# Patient Record
Sex: Female | Born: 1955
Health system: Southern US, Community
[De-identification: ages and names within clinical notes are randomized; demographics above are authoritative.]

## PROBLEM LIST (undated history)

## (undated) DIAGNOSIS — K2100 Gastro-esophageal reflux disease with esophagitis, without bleeding: Secondary | ICD-10-CM

## (undated) DIAGNOSIS — Z860100 Personal history of colon polyps, unspecified: Secondary | ICD-10-CM

## (undated) DIAGNOSIS — M1990 Unspecified osteoarthritis, unspecified site: Secondary | ICD-10-CM

## (undated) DIAGNOSIS — A419 Sepsis, unspecified organism: Secondary | ICD-10-CM

## (undated) DIAGNOSIS — K2970 Gastritis, unspecified, without bleeding: Secondary | ICD-10-CM

## (undated) DIAGNOSIS — D6949 Other primary thrombocytopenia: Secondary | ICD-10-CM

## (undated) DIAGNOSIS — B1920 Unspecified viral hepatitis C without hepatic coma: Secondary | ICD-10-CM

## (undated) DIAGNOSIS — N390 Urinary tract infection, site not specified: Secondary | ICD-10-CM

## (undated) DIAGNOSIS — C439 Malignant melanoma of skin, unspecified: Secondary | ICD-10-CM

## (undated) DIAGNOSIS — I10 Essential (primary) hypertension: Secondary | ICD-10-CM

## (undated) DIAGNOSIS — N200 Calculus of kidney: Secondary | ICD-10-CM

## (undated) HISTORY — DX: Unspecified viral hepatitis C without hepatic coma: B19.20

## (undated) HISTORY — PX: TONSILLECTOMY: SHX5217

## (undated) HISTORY — DX: Unspecified osteoarthritis, unspecified site: M19.90

## (undated) HISTORY — PX: TOTAL KNEE ARTHROPLASTY: SHX125

## (undated) HISTORY — PX: TONSILLECTOMY: SUR1361

## (undated) HISTORY — DX: Sepsis, unspecified organism: A41.9

## (undated) HISTORY — DX: Urinary tract infection, site not specified: N39.0

## (undated) HISTORY — DX: Other primary thrombocytopenia: D69.49

---

## 2012-02-23 ENCOUNTER — Emergency Department: Payer: Self-pay | Admitting: Emergency Medicine

## 2012-08-22 DIAGNOSIS — A419 Sepsis, unspecified organism: Secondary | ICD-10-CM

## 2012-08-22 DIAGNOSIS — B1920 Unspecified viral hepatitis C without hepatic coma: Secondary | ICD-10-CM

## 2012-08-22 HISTORY — DX: Unspecified viral hepatitis C without hepatic coma: B19.20

## 2012-08-22 HISTORY — DX: Sepsis, unspecified organism: A41.9

## 2012-10-16 LAB — URINALYSIS, COMPLETE
Bilirubin,UR: NEGATIVE
Hyaline Cast: 1
Ketone: NEGATIVE
Nitrite: POSITIVE
Protein: 30
Specific Gravity: 1.017 (ref 1.003–1.030)
Squamous Epithelial: <1
WBC UR: 43 /HPF (ref 0–5)

## 2012-10-16 LAB — CBC
HGB: 14.7 g/dL (ref 12.0–16.0)
MCHC: 33.7 g/dL (ref 32.0–36.0)
MCV: 98 fL (ref 80–100)
RBC: 4.45 10*6/uL (ref 3.80–5.20)
WBC: 6.8 10*3/uL (ref 3.6–11.0)

## 2012-10-16 LAB — COMPREHENSIVE METABOLIC PANEL
Albumin: 3.2 g/dL — ABNORMAL LOW (ref 3.4–5.0)
Alkaline Phosphatase: 136 U/L (ref 50–136)
Anion Gap: 5 — ABNORMAL LOW (ref 7–16)
Calcium, Total: 8.5 mg/dL (ref 8.5–10.1)
Co2: 28 mmol/L (ref 21–32)
Creatinine: 0.71 mg/dL (ref 0.60–1.30)
EGFR (African American): >60
EGFR (Non-African Amer.): >60
Glucose: 106 mg/dL — ABNORMAL HIGH (ref 65–99)
SGOT(AST): 204 U/L — ABNORMAL HIGH (ref 15–37)
SGPT (ALT): 224 U/L — ABNORMAL HIGH (ref 12–78)

## 2012-10-17 ENCOUNTER — Inpatient Hospital Stay: Payer: Self-pay | Admitting: Surgery

## 2012-10-17 LAB — HEPATIC FUNCTION PANEL A (ARMC)
Alkaline Phosphatase: 123 U/L (ref 50–136)
Bilirubin,Total: 1 mg/dL (ref 0.2–1.0)
SGPT (ALT): 255 U/L — ABNORMAL HIGH (ref 12–78)
Total Protein: 7.2 g/dL (ref 6.4–8.2)

## 2012-10-17 LAB — CBC WITH DIFFERENTIAL/PLATELET
Basophil #: 0 10*3/uL (ref 0.0–0.1)
Eosinophil #: 0.1 10*3/uL (ref 0.0–0.7)
Eosinophil %: 0.6 %
HCT: 41.4 % (ref 35.0–47.0)
HGB: 14 g/dL (ref 12.0–16.0)
Lymphocyte %: 17.3 %
MCH: 33.1 pg (ref 26.0–34.0)
MCHC: 33.9 g/dL (ref 32.0–36.0)
MCV: 98 fL (ref 80–100)
Neutrophil #: 6.1 10*3/uL (ref 1.4–6.5)
Neutrophil %: 72.4 %
RBC: 4.24 10*6/uL (ref 3.80–5.20)
RDW: 13.4 % (ref 11.5–14.5)
WBC: 8.5 10*3/uL (ref 3.6–11.0)

## 2012-10-17 LAB — BASIC METABOLIC PANEL
Anion Gap: 7 (ref 7–16)
BUN: 10 mg/dL (ref 7–18)
Calcium, Total: 7.8 mg/dL — ABNORMAL LOW (ref 8.5–10.1)
Chloride: 106 mmol/L (ref 98–107)
EGFR (African American): >60
Glucose: 115 mg/dL — ABNORMAL HIGH (ref 65–99)
Osmolality: 276 (ref 275–301)
Potassium: 3.5 mmol/L (ref 3.5–5.1)
Sodium: 138 mmol/L (ref 136–145)

## 2012-10-18 LAB — COMPREHENSIVE METABOLIC PANEL
Alkaline Phosphatase: 95 U/L (ref 50–136)
Anion Gap: 5 — ABNORMAL LOW (ref 7–16)
BUN: 8 mg/dL (ref 7–18)
Bilirubin,Total: 0.8 mg/dL (ref 0.2–1.0)
Calcium, Total: 7.8 mg/dL — ABNORMAL LOW (ref 8.5–10.1)
Co2: 25 mmol/L (ref 21–32)
Creatinine: 0.85 mg/dL (ref 0.60–1.30)
EGFR (African American): >60
EGFR (Non-African Amer.): >60
Glucose: 137 mg/dL — ABNORMAL HIGH (ref 65–99)
Osmolality: 282 (ref 275–301)
SGPT (ALT): 158 U/L — ABNORMAL HIGH (ref 12–78)
Sodium: 141 mmol/L (ref 136–145)
Total Protein: 5.9 g/dL — ABNORMAL LOW (ref 6.4–8.2)

## 2012-10-18 LAB — PROTIME-INR: Prothrombin Time: 16 secs — ABNORMAL HIGH (ref 11.5–14.7)

## 2012-10-18 LAB — CBC WITH DIFFERENTIAL/PLATELET
Basophil %: 0.2 %
Eosinophil #: 0 10*3/uL (ref 0.0–0.7)
Eosinophil %: 0.4 %
Lymphocyte %: 20.8 %
MCH: 32.3 pg (ref 26.0–34.0)
MCHC: 33.2 g/dL (ref 32.0–36.0)
Monocyte #: 1.1 x10 3/mm — ABNORMAL HIGH (ref 0.2–0.9)
Monocyte %: 17.6 %
Neutrophil #: 3.7 10*3/uL (ref 1.4–6.5)
Platelet: 46 10*3/uL — ABNORMAL LOW (ref 150–440)
RBC: 3.64 10*6/uL — ABNORMAL LOW (ref 3.80–5.20)
RDW: 13.3 % (ref 11.5–14.5)
WBC: 6.1 10*3/uL (ref 3.6–11.0)

## 2012-10-18 LAB — URINE CULTURE

## 2012-10-19 LAB — HEPATIC FUNCTION PANEL A (ARMC)
Albumin: 2.6 g/dL — ABNORMAL LOW (ref 3.4–5.0)
Alkaline Phosphatase: 109 U/L (ref 50–136)
Bilirubin,Total: 0.7 mg/dL (ref 0.2–1.0)

## 2012-10-19 LAB — PROTIME-INR
INR: 1.1
Prothrombin Time: 14.5 secs (ref 11.5–14.7)

## 2012-10-22 LAB — CULTURE, BLOOD (SINGLE)

## 2013-01-10 ENCOUNTER — Ambulatory Visit: Payer: Self-pay

## 2013-06-21 ENCOUNTER — Ambulatory Visit: Payer: Self-pay | Admitting: Gastroenterology

## 2013-07-31 ENCOUNTER — Ambulatory Visit: Payer: Self-pay | Admitting: Gastroenterology

## 2013-08-22 HISTORY — PX: JOINT REPLACEMENT: SHX530

## 2013-09-03 ENCOUNTER — Ambulatory Visit: Payer: Self-pay | Admitting: Orthopedic Surgery

## 2013-09-17 ENCOUNTER — Ambulatory Visit: Payer: Self-pay | Admitting: Orthopedic Surgery

## 2013-10-21 ENCOUNTER — Ambulatory Visit: Payer: Self-pay | Admitting: Gastroenterology

## 2013-10-30 ENCOUNTER — Ambulatory Visit: Payer: Self-pay | Admitting: Orthopedic Surgery

## 2013-10-30 LAB — URINALYSIS, COMPLETE
Bilirubin,UR: NEGATIVE
Blood: NEGATIVE
GLUCOSE, UR: NEGATIVE mg/dL (ref 0–75)
KETONE: NEGATIVE
LEUKOCYTE ESTERASE: NEGATIVE
NITRITE: NEGATIVE
Ph: 7 (ref 4.5–8.0)
Protein: NEGATIVE
RBC, UR: NONE SEEN /HPF (ref 0–5)
Specific Gravity: 1.018 (ref 1.003–1.030)

## 2013-10-30 LAB — BASIC METABOLIC PANEL
ANION GAP: 4 — AB (ref 7–16)
BUN: 17 mg/dL (ref 7–18)
Calcium, Total: 9.4 mg/dL (ref 8.5–10.1)
Chloride: 108 mmol/L — ABNORMAL HIGH (ref 98–107)
Co2: 26 mmol/L (ref 21–32)
Creatinine: 0.72 mg/dL (ref 0.60–1.30)
EGFR (African American): >60
EGFR (Non-African Amer.): >60
GLUCOSE: 114 mg/dL — AB (ref 65–99)
Osmolality: 278 (ref 275–301)
Potassium: 4 mmol/L (ref 3.5–5.1)
Sodium: 138 mmol/L (ref 136–145)

## 2013-10-30 LAB — PROTIME-INR
INR: 1.1
Prothrombin Time: 13.8 secs (ref 11.5–14.7)

## 2013-10-30 LAB — SEDIMENTATION RATE: Erythrocyte Sed Rate: 12 mm/hr (ref 0–30)

## 2013-10-30 LAB — CBC
HCT: 41.3 % (ref 35.0–47.0)
HGB: 14 g/dL (ref 12.0–16.0)
MCH: 33.4 pg (ref 26.0–34.0)
MCHC: 34 g/dL (ref 32.0–36.0)
MCV: 98 fL (ref 80–100)
PLATELETS: 71 10*3/uL — AB (ref 150–440)
RBC: 4.2 10*6/uL (ref 3.80–5.20)
RDW: 12.1 % (ref 11.5–14.5)
WBC: 5.8 10*3/uL (ref 3.6–11.0)

## 2013-10-30 LAB — MRSA PCR SCREENING

## 2013-10-30 LAB — APTT: Activated PTT: 31.8 secs (ref 23.6–35.9)

## 2013-10-31 ENCOUNTER — Ambulatory Visit: Payer: Self-pay | Admitting: Oncology

## 2013-11-05 ENCOUNTER — Ambulatory Visit: Payer: Self-pay | Admitting: Oncology

## 2013-11-05 LAB — LACTATE DEHYDROGENASE: LDH: 207 U/L (ref 81–246)

## 2013-11-05 LAB — CBC CANCER CENTER
BASOS PCT: 0.4 %
Basophil #: 0 x10 3/mm (ref 0.0–0.1)
Eosinophil #: 0.1 x10 3/mm (ref 0.0–0.7)
Eosinophil %: 1.8 %
HCT: 45.6 % (ref 35.0–47.0)
HGB: 15.3 g/dL (ref 12.0–16.0)
LYMPHS PCT: 24.8 %
Lymphocyte #: 1.7 x10 3/mm (ref 1.0–3.6)
MCH: 33.1 pg (ref 26.0–34.0)
MCHC: 33.5 g/dL (ref 32.0–36.0)
MCV: 99 fL (ref 80–100)
MONO ABS: 0.7 x10 3/mm (ref 0.2–0.9)
Monocyte %: 9.8 %
NEUTROS PCT: 63.2 %
Neutrophil #: 4.3 x10 3/mm (ref 1.4–6.5)
Platelet: 72 x10 3/mm — ABNORMAL LOW (ref 150–440)
RBC: 4.62 10*6/uL (ref 3.80–5.20)
RDW: 12.2 % (ref 11.5–14.5)
WBC: 6.7 x10 3/mm (ref 3.6–11.0)

## 2013-11-05 LAB — IRON AND TIBC
IRON SATURATION: 86 %
Iron Bind.Cap.(Total): 415 ug/dL (ref 250–450)
Iron: 357 ug/dL — ABNORMAL HIGH (ref 50–170)
UNBOUND IRON-BIND. CAP.: 58 ug/dL

## 2013-11-05 LAB — FOLATE: Folic Acid: 20.9 ng/mL (ref 3.1–100.0)

## 2013-11-05 LAB — FERRITIN: Ferritin (ARMC): 140 ng/mL (ref 8–388)

## 2013-11-15 LAB — CBC CANCER CENTER
BASOS PCT: 0.4 %
Basophil #: 0 x10 3/mm (ref 0.0–0.1)
EOS ABS: 0.2 x10 3/mm (ref 0.0–0.7)
EOS PCT: 2.6 %
HCT: 43.2 % (ref 35.0–47.0)
HGB: 14.7 g/dL (ref 12.0–16.0)
Lymphocyte #: 2.1 x10 3/mm (ref 1.0–3.6)
Lymphocyte %: 34.5 %
MCH: 33.4 pg (ref 26.0–34.0)
MCHC: 34 g/dL (ref 32.0–36.0)
MCV: 98 fL (ref 80–100)
MONOS PCT: 9.8 %
Monocyte #: 0.6 x10 3/mm (ref 0.2–0.9)
Neutrophil #: 3.1 x10 3/mm (ref 1.4–6.5)
Neutrophil %: 52.7 %
Platelet: 65 x10 3/mm — ABNORMAL LOW (ref 150–440)
RBC: 4.39 10*6/uL (ref 3.80–5.20)
RDW: 12.5 % (ref 11.5–14.5)
WBC: 6 x10 3/mm (ref 3.6–11.0)

## 2013-11-20 ENCOUNTER — Ambulatory Visit: Payer: Self-pay | Admitting: Oncology

## 2013-11-22 LAB — CBC CANCER CENTER
BASOS ABS: 0 x10 3/mm (ref 0.0–0.1)
Basophil %: 0.2 %
EOS PCT: 0.3 %
Eosinophil #: 0 x10 3/mm (ref 0.0–0.7)
HCT: 43.1 % (ref 35.0–47.0)
HGB: 14.4 g/dL (ref 12.0–16.0)
Lymphocyte #: 0.7 x10 3/mm — ABNORMAL LOW (ref 1.0–3.6)
Lymphocyte %: 13 %
MCH: 32.9 pg (ref 26.0–34.0)
MCHC: 33.3 g/dL (ref 32.0–36.0)
MCV: 99 fL (ref 80–100)
Monocyte #: 0.2 x10 3/mm (ref 0.2–0.9)
Monocyte %: 2.9 %
Neutrophil #: 4.8 x10 3/mm (ref 1.4–6.5)
Neutrophil %: 83.6 %
Platelet: 80 x10 3/mm — ABNORMAL LOW (ref 150–440)
RBC: 4.37 10*6/uL (ref 3.80–5.20)
RDW: 12.9 % (ref 11.5–14.5)
WBC: 5.7 x10 3/mm (ref 3.6–11.0)

## 2013-11-29 LAB — CBC CANCER CENTER
BASOS ABS: 0 x10 3/mm (ref 0.0–0.1)
Basophil %: 0.2 %
Eosinophil #: 0 x10 3/mm (ref 0.0–0.7)
Eosinophil %: 0.6 %
HCT: 43.5 % (ref 35.0–47.0)
HGB: 14.6 g/dL (ref 12.0–16.0)
LYMPHS PCT: 12.7 %
Lymphocyte #: 1 x10 3/mm (ref 1.0–3.6)
MCH: 33.2 pg (ref 26.0–34.0)
MCHC: 33.5 g/dL (ref 32.0–36.0)
MCV: 99 fL (ref 80–100)
Monocyte #: 0.7 x10 3/mm (ref 0.2–0.9)
Monocyte %: 8.7 %
Neutrophil #: 6.4 x10 3/mm (ref 1.4–6.5)
Neutrophil %: 77.8 %
Platelet: 75 x10 3/mm — ABNORMAL LOW (ref 150–440)
RBC: 4.39 10*6/uL (ref 3.80–5.20)
RDW: 13.2 % (ref 11.5–14.5)
WBC: 8.2 x10 3/mm (ref 3.6–11.0)

## 2013-12-06 LAB — CBC CANCER CENTER
BASOS ABS: 0 x10 3/mm (ref 0.0–0.1)
BASOS PCT: 0.3 %
Eosinophil #: 0.1 x10 3/mm (ref 0.0–0.7)
Eosinophil %: 0.9 %
HCT: 44.8 % (ref 35.0–47.0)
HGB: 14.9 g/dL (ref 12.0–16.0)
Lymphocyte #: 1 x10 3/mm (ref 1.0–3.6)
Lymphocyte %: 10.7 %
MCH: 33 pg (ref 26.0–34.0)
MCHC: 33.2 g/dL (ref 32.0–36.0)
MCV: 99 fL (ref 80–100)
Monocyte #: 0.8 x10 3/mm (ref 0.2–0.9)
Monocyte %: 8.5 %
Neutrophil #: 7.1 x10 3/mm — ABNORMAL HIGH (ref 1.4–6.5)
Neutrophil %: 79.6 %
Platelet: 72 x10 3/mm — ABNORMAL LOW (ref 150–440)
RBC: 4.5 10*6/uL (ref 3.80–5.20)
RDW: 13.4 % (ref 11.5–14.5)
WBC: 9 x10 3/mm (ref 3.6–11.0)

## 2013-12-13 LAB — CBC CANCER CENTER
BASOS ABS: 0 x10 3/mm (ref 0.0–0.1)
Basophil %: 0.3 %
EOS PCT: 1.4 %
Eosinophil #: 0.1 x10 3/mm (ref 0.0–0.7)
HCT: 44.9 % (ref 35.0–47.0)
HGB: 15 g/dL (ref 12.0–16.0)
Lymphocyte #: 3 x10 3/mm (ref 1.0–3.6)
Lymphocyte %: 32.3 %
MCH: 33.1 pg (ref 26.0–34.0)
MCHC: 33.4 g/dL (ref 32.0–36.0)
MCV: 99 fL (ref 80–100)
MONO ABS: 0.9 x10 3/mm (ref 0.2–0.9)
MONOS PCT: 9.5 %
NEUTROS ABS: 5.2 x10 3/mm (ref 1.4–6.5)
Neutrophil %: 56.5 %
Platelet: 70 x10 3/mm — ABNORMAL LOW (ref 150–440)
RBC: 4.53 10*6/uL (ref 3.80–5.20)
RDW: 13.6 % (ref 11.5–14.5)
WBC: 9.2 x10 3/mm (ref 3.6–11.0)

## 2013-12-20 ENCOUNTER — Ambulatory Visit: Payer: Self-pay | Admitting: Oncology

## 2013-12-20 LAB — CBC CANCER CENTER
BASOS ABS: 0 x10 3/mm (ref 0.0–0.1)
Basophil %: 0.3 %
EOS PCT: 0.2 %
Eosinophil #: 0 x10 3/mm (ref 0.0–0.7)
HCT: 45.2 % (ref 35.0–47.0)
HGB: 15.7 g/dL (ref 12.0–16.0)
LYMPHS ABS: 0.7 x10 3/mm — AB (ref 1.0–3.6)
Lymphocyte %: 9 %
MCH: 33.6 pg (ref 26.0–34.0)
MCHC: 34.8 g/dL (ref 32.0–36.0)
MCV: 97 fL (ref 80–100)
MONOS PCT: 3.2 %
Monocyte #: 0.2 x10 3/mm (ref 0.2–0.9)
Neutrophil #: 6.4 x10 3/mm (ref 1.4–6.5)
Neutrophil %: 87.3 %
Platelet: 65 x10 3/mm — ABNORMAL LOW (ref 150–440)
RBC: 4.69 10*6/uL (ref 3.80–5.20)
RDW: 13.4 % (ref 11.5–14.5)
WBC: 7.3 x10 3/mm (ref 3.6–11.0)

## 2013-12-26 LAB — CBC CANCER CENTER
BASOS ABS: 0 x10 3/mm (ref 0.0–0.1)
Basophil %: 0.5 %
Eosinophil #: 0.1 x10 3/mm (ref 0.0–0.7)
Eosinophil %: 1.7 %
HCT: 43.3 % (ref 35.0–47.0)
HGB: 14.8 g/dL (ref 12.0–16.0)
Lymphocyte #: 2.7 x10 3/mm (ref 1.0–3.6)
Lymphocyte %: 30.2 %
MCH: 33.7 pg (ref 26.0–34.0)
MCHC: 34.2 g/dL (ref 32.0–36.0)
MCV: 98 fL (ref 80–100)
Monocyte #: 0.8 x10 3/mm (ref 0.2–0.9)
Monocyte %: 8.7 %
NEUTROS PCT: 58.9 %
Neutrophil #: 5.2 x10 3/mm (ref 1.4–6.5)
PLATELETS: 73 x10 3/mm — AB (ref 150–440)
RBC: 4.4 10*6/uL (ref 3.80–5.20)
RDW: 13.2 % (ref 11.5–14.5)
WBC: 8.8 x10 3/mm (ref 3.6–11.0)

## 2014-01-03 LAB — BASIC METABOLIC PANEL
Anion Gap: 8 (ref 7–16)
BUN: 15 mg/dL (ref 7–18)
CALCIUM: 8.8 mg/dL (ref 8.5–10.1)
CO2: 29 mmol/L (ref 21–32)
CREATININE: 0.74 mg/dL (ref 0.60–1.30)
Chloride: 104 mmol/L (ref 98–107)
EGFR (African American): >60
EGFR (Non-African Amer.): >60
Glucose: 108 mg/dL — ABNORMAL HIGH (ref 65–99)
Osmolality: 283 (ref 275–301)
POTASSIUM: 3.3 mmol/L — AB (ref 3.5–5.1)
SODIUM: 141 mmol/L (ref 136–145)

## 2014-01-03 LAB — CBC CANCER CENTER
Basophil #: 0.1 x10 3/mm (ref 0.0–0.1)
Basophil %: 1 %
EOS PCT: 2.7 %
Eosinophil #: 0.2 x10 3/mm (ref 0.0–0.7)
HCT: 43.7 % (ref 35.0–47.0)
HGB: 15.4 g/dL (ref 12.0–16.0)
LYMPHS ABS: 1.7 x10 3/mm (ref 1.0–3.6)
LYMPHS PCT: 21.3 %
MCH: 33.5 pg (ref 26.0–34.0)
MCHC: 35.3 g/dL (ref 32.0–36.0)
MCV: 95 fL (ref 80–100)
MONO ABS: 1.1 x10 3/mm — AB (ref 0.2–0.9)
Monocyte %: 13.8 %
NEUTROS ABS: 5 x10 3/mm (ref 1.4–6.5)
Neutrophil %: 61.2 %
PLATELETS: 72 x10 3/mm — AB (ref 150–440)
RBC: 4.61 10*6/uL (ref 3.80–5.20)
RDW: 13.1 % (ref 11.5–14.5)
WBC: 8.1 x10 3/mm (ref 3.6–11.0)

## 2014-01-10 LAB — CBC CANCER CENTER
Basophil #: 0.1 x10 3/mm (ref 0.0–0.1)
Basophil %: 1.1 %
Eosinophil #: 0.3 x10 3/mm (ref 0.0–0.7)
Eosinophil %: 3.8 %
HCT: 44 % (ref 35.0–47.0)
HGB: 15.5 g/dL (ref 12.0–16.0)
LYMPHS PCT: 20.2 %
Lymphocyte #: 1.7 x10 3/mm (ref 1.0–3.6)
MCH: 33.7 pg (ref 26.0–34.0)
MCHC: 35.3 g/dL (ref 32.0–36.0)
MCV: 96 fL (ref 80–100)
MONO ABS: 0.8 x10 3/mm (ref 0.2–0.9)
MONOS PCT: 8.9 %
Neutrophil #: 5.6 x10 3/mm (ref 1.4–6.5)
Neutrophil %: 66 %
Platelet: 77 x10 3/mm — ABNORMAL LOW (ref 150–440)
RBC: 4.6 10*6/uL (ref 3.80–5.20)
RDW: 13 % (ref 11.5–14.5)
WBC: 8.5 x10 3/mm (ref 3.6–11.0)

## 2014-01-17 LAB — CBC CANCER CENTER
BASOS PCT: 0.8 %
Basophil #: 0.1 x10 3/mm (ref 0.0–0.1)
Eosinophil #: 0.4 x10 3/mm (ref 0.0–0.7)
Eosinophil %: 4 %
HCT: 45.5 % (ref 35.0–47.0)
HGB: 15.7 g/dL (ref 12.0–16.0)
LYMPHS ABS: 1.5 x10 3/mm (ref 1.0–3.6)
Lymphocyte %: 15.7 %
MCH: 33.4 pg (ref 26.0–34.0)
MCHC: 34.6 g/dL (ref 32.0–36.0)
MCV: 97 fL (ref 80–100)
MONOS PCT: 13.6 %
Monocyte #: 1.3 x10 3/mm — ABNORMAL HIGH (ref 0.2–0.9)
NEUTROS ABS: 6.2 x10 3/mm (ref 1.4–6.5)
Neutrophil %: 65.9 %
PLATELETS: 78 x10 3/mm — AB (ref 150–440)
RBC: 4.71 10*6/uL (ref 3.80–5.20)
RDW: 13.2 % (ref 11.5–14.5)
WBC: 9.4 x10 3/mm (ref 3.6–11.0)

## 2014-01-20 ENCOUNTER — Ambulatory Visit: Payer: Self-pay | Admitting: Oncology

## 2014-01-31 LAB — CBC CANCER CENTER
BASOS ABS: 0.1 x10 3/mm (ref 0.0–0.1)
BASOS PCT: 1.2 %
EOS PCT: 6.4 %
Eosinophil #: 0.4 x10 3/mm (ref 0.0–0.7)
HCT: 42.7 % (ref 35.0–47.0)
HGB: 14.9 g/dL (ref 12.0–16.0)
LYMPHS ABS: 1.2 x10 3/mm (ref 1.0–3.6)
LYMPHS PCT: 20 %
MCH: 33.4 pg (ref 26.0–34.0)
MCHC: 34.8 g/dL (ref 32.0–36.0)
MCV: 96 fL (ref 80–100)
MONOS PCT: 14 %
Monocyte #: 0.8 x10 3/mm (ref 0.2–0.9)
NEUTROS PCT: 58.4 %
Neutrophil #: 3.5 x10 3/mm (ref 1.4–6.5)
Platelet: 87 x10 3/mm — ABNORMAL LOW (ref 150–440)
RBC: 4.45 10*6/uL (ref 3.80–5.20)
RDW: 13 % (ref 11.5–14.5)
WBC: 5.9 x10 3/mm (ref 3.6–11.0)

## 2014-02-14 LAB — CBC CANCER CENTER
Basophil #: 0.1 x10 3/mm (ref 0.0–0.1)
Basophil %: 0.9 %
Eosinophil #: 0.3 x10 3/mm (ref 0.0–0.7)
Eosinophil %: 4.6 %
HCT: 43.7 % (ref 35.0–47.0)
HGB: 15.2 g/dL (ref 12.0–16.0)
Lymphocyte #: 1.7 x10 3/mm (ref 1.0–3.6)
Lymphocyte %: 27.7 %
MCH: 33.6 pg (ref 26.0–34.0)
MCHC: 34.8 g/dL (ref 32.0–36.0)
MCV: 97 fL (ref 80–100)
MONO ABS: 0.9 x10 3/mm (ref 0.2–0.9)
MONOS PCT: 14.9 %
Neutrophil #: 3.1 x10 3/mm (ref 1.4–6.5)
Neutrophil %: 51.9 %
Platelet: 90 x10 3/mm — ABNORMAL LOW (ref 150–440)
RBC: 4.53 10*6/uL (ref 3.80–5.20)
RDW: 13.1 % (ref 11.5–14.5)
WBC: 6 x10 3/mm (ref 3.6–11.0)

## 2014-02-19 ENCOUNTER — Ambulatory Visit: Payer: Self-pay | Admitting: Oncology

## 2014-02-24 ENCOUNTER — Ambulatory Visit: Payer: Self-pay | Admitting: Orthopedic Surgery

## 2014-02-24 LAB — URINALYSIS, COMPLETE
BILIRUBIN, UR: NEGATIVE
Glucose,UR: NEGATIVE mg/dL (ref 0–75)
KETONE: NEGATIVE
Leukocyte Esterase: NEGATIVE
Nitrite: POSITIVE
Ph: 6 (ref 4.5–8.0)
Protein: NEGATIVE
Specific Gravity: 1.02 (ref 1.003–1.030)
Squamous Epithelial: 1
WBC UR: 5 /HPF (ref 0–5)

## 2014-02-24 LAB — PROTIME-INR
INR: 1
Prothrombin Time: 13.1 secs (ref 11.5–14.7)

## 2014-02-24 LAB — CBC
HCT: 42.7 % (ref 35.0–47.0)
HGB: 14.8 g/dL (ref 12.0–16.0)
MCH: 33.7 pg (ref 26.0–34.0)
MCHC: 34.7 g/dL (ref 32.0–36.0)
MCV: 97 fL (ref 80–100)
PLATELETS: 68 10*3/uL — AB (ref 150–440)
RBC: 4.4 10*6/uL (ref 3.80–5.20)
RDW: 12.9 % (ref 11.5–14.5)
WBC: 5.6 10*3/uL (ref 3.6–11.0)

## 2014-02-24 LAB — BASIC METABOLIC PANEL
Anion Gap: 7 (ref 7–16)
BUN: 17 mg/dL (ref 7–18)
CREATININE: 0.68 mg/dL (ref 0.60–1.30)
Calcium, Total: 8.9 mg/dL (ref 8.5–10.1)
Chloride: 111 mmol/L — ABNORMAL HIGH (ref 98–107)
Co2: 24 mmol/L (ref 21–32)
EGFR (African American): >60
Glucose: 86 mg/dL (ref 65–99)
Osmolality: 284 (ref 275–301)
Potassium: 3.6 mmol/L (ref 3.5–5.1)
Sodium: 142 mmol/L (ref 136–145)

## 2014-02-24 LAB — SEDIMENTATION RATE: Erythrocyte Sed Rate: 7 mm/hr (ref 0–30)

## 2014-02-24 LAB — APTT: ACTIVATED PTT: 35.1 s (ref 23.6–35.9)

## 2014-02-24 LAB — MRSA PCR SCREENING

## 2014-03-03 DIAGNOSIS — K7460 Unspecified cirrhosis of liver: Secondary | ICD-10-CM | POA: Insufficient documentation

## 2014-03-05 LAB — CBC CANCER CENTER
Basophil #: 0 x10 3/mm (ref 0.0–0.1)
Basophil %: 0.7 %
EOS PCT: 5 %
Eosinophil #: 0.3 x10 3/mm (ref 0.0–0.7)
HCT: 43 % (ref 35.0–47.0)
HGB: 15 g/dL (ref 12.0–16.0)
Lymphocyte #: 1.5 x10 3/mm (ref 1.0–3.6)
Lymphocyte %: 25.9 %
MCH: 33.8 pg (ref 26.0–34.0)
MCHC: 34.9 g/dL (ref 32.0–36.0)
MCV: 97 fL (ref 80–100)
MONO ABS: 0.7 x10 3/mm (ref 0.2–0.9)
Monocyte %: 11.8 %
NEUTROS PCT: 56.6 %
Neutrophil #: 3.3 x10 3/mm (ref 1.4–6.5)
PLATELETS: 76 x10 3/mm — AB (ref 150–440)
RBC: 4.45 10*6/uL (ref 3.80–5.20)
RDW: 12.2 % (ref 11.5–14.5)
WBC: 5.8 x10 3/mm (ref 3.6–11.0)

## 2014-03-06 ENCOUNTER — Inpatient Hospital Stay: Payer: Self-pay | Admitting: Orthopedic Surgery

## 2014-03-06 LAB — PLATELET COUNT: PLATELETS: 86 10*3/uL — AB (ref 150–440)

## 2014-03-07 LAB — BASIC METABOLIC PANEL
Anion Gap: 2 — ABNORMAL LOW (ref 7–16)
BUN: 10 mg/dL (ref 7–18)
CALCIUM: 8.5 mg/dL (ref 8.5–10.1)
CHLORIDE: 110 mmol/L — AB (ref 98–107)
Co2: 25 mmol/L (ref 21–32)
Creatinine: 0.74 mg/dL (ref 0.60–1.30)
EGFR (African American): >60
EGFR (Non-African Amer.): >60
Glucose: 121 mg/dL — ABNORMAL HIGH (ref 65–99)
OSMOLALITY: 274 (ref 275–301)
Potassium: 4.2 mmol/L (ref 3.5–5.1)
SODIUM: 137 mmol/L (ref 136–145)

## 2014-03-07 LAB — PLATELET COUNT: PLATELETS: 101 10*3/uL — AB (ref 150–440)

## 2014-03-07 LAB — HEMOGLOBIN: HGB: 13.4 g/dL (ref 12.0–16.0)

## 2014-03-08 LAB — BASIC METABOLIC PANEL
Anion Gap: 7 (ref 7–16)
BUN: 7 mg/dL (ref 7–18)
CHLORIDE: 105 mmol/L (ref 98–107)
CREATININE: 0.62 mg/dL (ref 0.60–1.30)
Calcium, Total: 8.1 mg/dL — ABNORMAL LOW (ref 8.5–10.1)
Co2: 26 mmol/L (ref 21–32)
EGFR (African American): >60
EGFR (Non-African Amer.): >60
GLUCOSE: 112 mg/dL — AB (ref 65–99)
Osmolality: 274 (ref 275–301)
Potassium: 3.3 mmol/L — ABNORMAL LOW (ref 3.5–5.1)
Sodium: 138 mmol/L (ref 136–145)

## 2014-03-08 LAB — HEMOGLOBIN: HGB: 12.9 g/dL (ref 12.0–16.0)

## 2014-03-08 LAB — PLATELET COUNT: Platelet: 69 10*3/uL — ABNORMAL LOW (ref 150–440)

## 2014-03-09 LAB — BASIC METABOLIC PANEL
ANION GAP: 11 (ref 7–16)
BUN: 8 mg/dL (ref 7–18)
CHLORIDE: 103 mmol/L (ref 98–107)
CO2: 23 mmol/L (ref 21–32)
Calcium, Total: 8.2 mg/dL — ABNORMAL LOW (ref 8.5–10.1)
Creatinine: 0.66 mg/dL (ref 0.60–1.30)
EGFR (African American): >60
Glucose: 98 mg/dL (ref 65–99)
OSMOLALITY: 272 (ref 275–301)
POTASSIUM: 3.6 mmol/L (ref 3.5–5.1)
SODIUM: 137 mmol/L (ref 136–145)

## 2014-03-09 LAB — PLATELET COUNT: PLATELETS: 71 10*3/uL — AB (ref 150–440)

## 2014-03-11 LAB — PATHOLOGY REPORT

## 2014-03-22 ENCOUNTER — Ambulatory Visit: Payer: Self-pay | Admitting: Oncology

## 2014-04-17 ENCOUNTER — Ambulatory Visit: Payer: Self-pay | Admitting: Oncology

## 2014-04-17 LAB — CBC CANCER CENTER
Basophil #: 0 x10 3/mm (ref 0.0–0.1)
Basophil %: 0.7 %
Eosinophil #: 0.1 x10 3/mm (ref 0.0–0.7)
Eosinophil %: 2.7 %
HCT: 43.7 % (ref 35.0–47.0)
HGB: 14.5 g/dL (ref 12.0–16.0)
LYMPHS ABS: 1.2 x10 3/mm (ref 1.0–3.6)
LYMPHS PCT: 24.5 %
MCH: 30.9 pg (ref 26.0–34.0)
MCHC: 33.2 g/dL (ref 32.0–36.0)
MCV: 93 fL (ref 80–100)
MONO ABS: 0.6 x10 3/mm (ref 0.2–0.9)
MONOS PCT: 12.4 %
NEUTROS ABS: 2.9 x10 3/mm (ref 1.4–6.5)
Neutrophil %: 59.7 %
Platelet: 80 x10 3/mm — ABNORMAL LOW (ref 150–440)
RBC: 4.7 10*6/uL (ref 3.80–5.20)
RDW: 12.8 % (ref 11.5–14.5)
WBC: 4.9 x10 3/mm (ref 3.6–11.0)

## 2014-04-22 ENCOUNTER — Ambulatory Visit: Payer: Self-pay | Admitting: Oncology

## 2014-05-01 ENCOUNTER — Ambulatory Visit: Payer: Self-pay | Admitting: Gastroenterology

## 2014-07-08 ENCOUNTER — Ambulatory Visit: Payer: Self-pay | Admitting: Gastroenterology

## 2014-07-08 LAB — CLOSTRIDIUM DIFFICILE(ARMC)

## 2014-07-10 LAB — STOOL CULTURE

## 2014-07-24 ENCOUNTER — Ambulatory Visit: Payer: Self-pay | Admitting: Oncology

## 2014-07-24 ENCOUNTER — Ambulatory Visit: Payer: Self-pay

## 2014-07-24 LAB — CBC CANCER CENTER
BASOS PCT: 0.3 %
Basophil #: 0 x10 3/mm (ref 0.0–0.1)
EOS ABS: 0.1 x10 3/mm (ref 0.0–0.7)
Eosinophil %: 2.6 %
HCT: 40.1 % (ref 35.0–47.0)
HGB: 13.5 g/dL (ref 12.0–16.0)
LYMPHS ABS: 1.7 x10 3/mm (ref 1.0–3.6)
Lymphocyte %: 33.9 %
MCH: 31.4 pg (ref 26.0–34.0)
MCHC: 33.8 g/dL (ref 32.0–36.0)
MCV: 93 fL (ref 80–100)
Monocyte #: 0.6 x10 3/mm (ref 0.2–0.9)
Monocyte %: 11.2 %
Neutrophil #: 2.7 x10 3/mm (ref 1.4–6.5)
Neutrophil %: 52 %
PLATELETS: 82 x10 3/mm — AB (ref 150–440)
RBC: 4.32 10*6/uL (ref 3.80–5.20)
RDW: 13.7 % (ref 11.5–14.5)
WBC: 5.1 x10 3/mm (ref 3.6–11.0)

## 2014-07-24 LAB — COMPREHENSIVE METABOLIC PANEL
ALK PHOS: 139 U/L — AB
AST: 38 U/L — AB (ref 15–37)
Albumin: 3.6 g/dL (ref 3.4–5.0)
Anion Gap: 7 (ref 7–16)
BUN: 21 mg/dL — ABNORMAL HIGH (ref 7–18)
Bilirubin,Total: 0.4 mg/dL (ref 0.2–1.0)
CALCIUM: 8.6 mg/dL (ref 8.5–10.1)
Chloride: 103 mmol/L (ref 98–107)
Co2: 29 mmol/L (ref 21–32)
Creatinine: 0.69 mg/dL (ref 0.60–1.30)
EGFR (African American): >60
EGFR (Non-African Amer.): >60
GLUCOSE: 94 mg/dL (ref 65–99)
Osmolality: 280 (ref 275–301)
Potassium: 3.8 mmol/L (ref 3.5–5.1)
SGPT (ALT): 52 U/L
Sodium: 139 mmol/L (ref 136–145)
Total Protein: 6.8 g/dL (ref 6.4–8.2)

## 2014-07-24 LAB — LIPID PANEL
Cholesterol: 162 mg/dL (ref 0–200)
HDL Cholesterol: 81 mg/dL — ABNORMAL HIGH (ref 40–60)
LDL CHOLESTEROL, CALC: 66 mg/dL (ref 0–100)
Triglycerides: 74 mg/dL (ref 0–200)
VLDL Cholesterol, Calc: 15 mg/dL (ref 5–40)

## 2014-07-24 LAB — T4, FREE: Free Thyroxine: 0.85 ng/dL (ref 0.76–1.46)

## 2014-07-24 LAB — TSH: THYROID STIMULATING HORM: 1.03 u[IU]/mL

## 2014-08-22 ENCOUNTER — Ambulatory Visit: Payer: Self-pay | Admitting: Oncology

## 2014-10-23 ENCOUNTER — Ambulatory Visit
Admit: 2014-10-23 | Disposition: A | Discharge status: Home / Self Care | Payer: Self-pay | Attending: Oncology | Admitting: Oncology

## 2014-10-23 ENCOUNTER — Ambulatory Visit: Admit: 2014-10-23 | Disposition: A | Discharge status: Home / Self Care | Payer: Self-pay | Admitting: Oncology

## 2014-11-19 ENCOUNTER — Ambulatory Visit
Admit: 2014-11-19 | Disposition: A | Discharge status: Home / Self Care | Payer: Self-pay | Attending: Nurse Practitioner | Admitting: Nurse Practitioner

## 2014-11-21 ENCOUNTER — Ambulatory Visit
Admit: 2014-11-21 | Disposition: A | Discharge status: Home / Self Care | Payer: Self-pay | Attending: Gastroenterology | Admitting: Gastroenterology

## 2014-12-12 NOTE — Consult Note (Signed)
PATIENT NAME:  Angie [name], Angie [name] MR#:  [id number] DATE OF BIRTH:  11-08-1955  DATE OF CONSULTATION:  10/17/2012  REFERRING PHYSICIAN:  Micheline Maze, MD CONSULTING PHYSICIAN:  Tana Conch. Leslye Peer, MD PRIMARY CARE PHYSICIAN: None.   REASON FOR CONSULTATION: Elevated liver function tests and thrombocytopenia.   CHIEF COMPLAINT: Nausea, vomiting, abdominal pain and fever.   HISTORY OF PRESENT ILLNESS: This is a 59 year old female who was in her usual state of health prior to Monday a.m. where she started developing fever and abdominal pain, mostly in the right upper quadrant, very severe in nature, radiating to the epigastric area. Also headache, nausea and vomiting. No hematemesis. She went to the [facility] and was referred over to the ER for further evaluation. She was also having swelling on her right side of the abdomen going on and off for the last few months. She was admitted by the surgical tea, Dr. Pat Patrick, on 10/16/2012 for further evaluation and evaluation for possible gallbladder cause. In the ER, she was found to have elevated liver function tests, ALT and AST in the 200 range, and found to have thrombocytopenia of platelet count of 58. She had an ultrasound of the abdomen that showed the liver is normal in appearance and no evidence of gallstones or gallbladder wall thickening. The patient had a positive urinalysis. A CT scan of the abdomen and pelvis showed gallbladder distention with mild gallbladder wall thickening versus pericholecystic fluid. Hospitalist services were contacted for consultation for further evaluation of thrombocytopenia and elevated liver function tests.  PAST MEDICAL HISTORY: Arthritis in the back and knees.   PAST SURGICAL HISTORY: None.   ALLERGIES: No known drug allergies.   MEDICATIONS: As an outpatient include etodolac 500 mg twice a day and biotin daily.   SOCIAL HISTORY: Occasional smoker. One pack usually lasts for a week. Positive alcohol, 3 to 4 beverages,  either wine or mixed drinks per day. No drug use. She works as a Librarian, academic in  [employer]   FAMILY HISTORY: Father has liver cancer and also had bladder cancer. Mother with COPD and a pacemaker.   REVIEW OF SYSTEMS:  CONSTITUTIONAL: Positive for fever. Positive for sweats. No weight gain. No weight loss. No weakness.  EYES: She does wear glasses.  EARS, NOSE, MOUTH AND THROAT: No hearing loss. No sore throat. No difficulty swallowing.  CARDIOVASCULAR: No chest pain. Positive for palpitations.  RESPIRATORY: No shortness of breath. No coughing. No sputum. No hemoptysis.  GASTROINTESTINAL: Positive for nausea. Positive for vomiting. No hematemesis. Positive for abdominal pain. No diarrhea. No constipation. No bright red blood per rectum. No melena.  GENITOURINARY: No burning on urination. No hematuria.  MUSCULOSKELETAL: Positive joint pain in the knees and low back.  NEUROLOGIC: No fainting or blackouts.  INTEGUMENTARY: No rashes or eruptions.  PSYCHIATRIC: No anxiety or depression.  ENDOCRINE: No thyroid problems.  HEMATOLOGIC AND LYMPHATIC: No anemia that she knows of.    PHYSICAL EXAMINATION:  VITAL SIGNS: T-max 103, pulse maximum 103, blood pressure 135/78, pulse oximetry 93% on room air.  GENERAL: No respiratory distress.  EYES: Conjunctivae and lids normal. Pupils equal, round and reactive to light. Extraocular muscles intact. No nystagmus.  EARS, NOSE, MOUTH AND THROAT: Nasal mucosa: No erythema. Throat: No erythema, no exudate seen. Lips and gums: No lesions.  NECK: No JVD. No bruits. No lymphadenopathy. No thyromegaly. No thyroid nodules palpated.  LUNGS: Clear to auscultation. No use of accessory muscles to breathe. No rhonchi, rales or wheeze heard.  CARDIOVASCULAR:  S1, S2 normal. No gallops, rubs or murmurs heard. Carotid upstroke 2+ bilaterally. No bruits. Dorsalis pedis pulses 2+ bilaterally. No edema of the lower extremities.  ABDOMEN: Soft. Positive tenderness in the right  upper quadrant. No organosplenomegaly. Normoactive bowel sounds. No masses felt.  LYMPHATIC: No lymph nodes in the neck.  MUSCULOSKELETAL: No clubbing, edema or cyanosis.  SKIN: No rashes or ulcers seen.  NEUROLOGIC: Cranial nerves II through XII grossly intact. Deep tendon reflexes 1+ bilateral lower extremities.  PSYCHIATRIC: The patient is oriented to person, place and time.   LABORATORY AND RADIOLOGICAL DATA: Glucose 106, BUN 12, creatinine 0.71, sodium 137, potassium 3.3, chloride 104, CO2 of 28, calcium 8.5. Liver function tests: Total bilirubin 1.5, alkaline phosphatase 136, ALT 224, AST 204, total protein 7.4. Lipase normal. White blood cell count 6.8, hemoglobin and hematocrit 14.7 and 43.4, platelet count of 58. Ultrasound of the abdomen showed no evidence of gallstones or gallbladder wall thickening, liver normal in appearance. Urinalysis: 3+ leukocyte esterase, 1+ blood, 1+ bacteria. CT scan of the abdomen and pelvis showed gallbladder distention with mild gallbladder wall thickening versus pericholecystic fluid. Urine culture grew out greater than 100,000 gram-negative rods. Repeat liver function tests showed an ALT of 255, AST 260, alkaline phosphatase normalized at 123, total bilirubin normalized at 1.0. Platelet count came up slightly at 64. White count remained normal at 8.5. HIDA scan showed normal-appearing hepatobiliary scan other than some residual activity in the hepatic parenchyma suggestive of poor hepatocellular function.   ASSESSMENT AND PLAN:  1.  Systemic inflammatory response syndrome with a fever up to 103, tachycardia up to 103. The patient's symptoms are abdominal pain in the right upper quadrant, nausea and vomiting. Imaging tests unremarkable. I do see a urinary tract infection, which I will give IV Levaquin for and follow up blood cultures and urine culture. This could also be viral. Hepatitis profiles were sent off. Parvovirus sent off. We will follow up the blood  culture.  2.  Right upper quadrant abdominal pain, nausea, vomiting, elevated liver function tests and thrombocytopenia. Hepatitis profile 1 was actually sent off which includes a B core IgM, B surface antigen and hepatitis A IgM. I added a hepatitis C and hepatitis B surface antibody. I also sent off a viral parvovirus. The patient must stop drinking alcohol. We will give supportive care with IV fluids and nausea and pain medications. Stop etodolac. Try not to give Tylenol. When I see elevated liver function tests and thrombocytopenia, I always think about alcohol and hepatitis C as causative agents. I did speak with the patient about alcohol cessation and that she must stop alcohol at all costs, and we sent off a hepatitis C profile. Time Spent on alcohol cessation: 15 minutes.  3.  Tobacco abuse. Smoking cessation counseling discussed 3 minutes by me.   This was discussed with the patient and boyfriend at the bedside.   TIME SPENT ON CONSULTATION: 50 minutes.     ____________________________ Tana Conch. Leslye Peer, MD rjw:jm D: 10/17/2012 16:21:11 ET T: 10/17/2012 17:34:53 ET JOB#: [id number]  cc: Tana Conch. Leslye Peer, MD, <Dictator> Rodena Goldmann III, MD Marisue Brooklyn MD ELECTRONICALLY SIGNED 10/23/2012 13:16

## 2014-12-12 NOTE — Discharge Summary (Signed)
PATIENT NAME:  Angie [name], CAMMARATA MR#:  [id number] DATE OF BIRTH:  06/15/1956  DATE OF ADMISSION:  10/17/2012 DATE OF DISCHARGE:  10/19/2012.   DIAGNOSES: 1.  Elevated liver function tests.  2.  Hepatitis C.  3.  Urosepsis with positive urine cultures and blood cultures.   PROCEDURES:  None.   CONSULTANTS:  [facility].   HISTORY OF PRESENT ILLNESS AND HOSPITAL COURSE:  This is a patient who was in the hospital with fevers and some abdominal pain and elevated liver function tests. Workup suggested the possibility of gallstones on CAT scan; however, on ultrasound, no gallstones were identified, no thickening or pericholecystic fluid was noted and a HIDA scan failed to show any identifiable pathology within the gallbladder and the cystic duct was patent. A workup for her liver function test elevation suggested the presence of hepatitis-C antibiotics, and both urine cultures and blood cultures came back growing a gram-negative rod suggestive of E. coli. Dr. Leslye Peer and I discussed the patient's disposition and the patient wishes to go home today. Dr. Leslye Peer is changing her to p.o. antibiotics and will be sending her home with a prescription for antibiotics to follow up in her primary care physician's office as needed. There were no surgical indications in this patient and she will follow up with her primary care physician. And she is instructed to restart any home medications although she denies any.  ____________________________ Jerrol Banana. Burt Knack, MD rec:jm D: 10/19/2012 11:18:21 ET T: 10/19/2012 11:53:58 ET JOB#: [id number]  cc: Jerrol Banana. Burt Knack, MD, <Dictator> Florene Glen MD ELECTRONICALLY SIGNED 10/19/2012 14:52

## 2014-12-12 NOTE — Consult Note (Signed)
Chief Complaint:  Subjective/Chief Complaint Patient seen and examined, please see full GI consult.  Patient anmitted with abdominal pain, n/v, abnormal lfts.  Now with new diagnosis of Hepatitis C.  Lfts are improving, patietn feeling better.  Discussed diagnosis some with patient. Likely combined reactive hepatopathy with baseline HCV.  Recommend HCV PCR with genotype (ordered) as HCV abs are non-neutralising and need to verify diagnosis as 15 % may have positive ab without virus present.  Will recheck labs in am.   VITAL SIGNS/ANCILLARY NOTES: **Vital Signs.:   27-Feb-14 18:23  Vital Signs Type Routine  Temperature Temperature (F) 98.5  Celsius 36.9  Temperature Source oral  Pulse Pulse 76  Respirations Respirations 20  Systolic BP Systolic BP 119  Diastolic BP (mmHg) Diastolic BP (mmHg) 79  Mean BP 96  Pulse Ox % Pulse Ox % 97  Pulse Ox Activity Level  At rest  Oxygen Delivery Room Air/ 21 %   Electronic Signatures: Loistine Simas (MD)  (Signed 27-Feb-14 20:32)  Authored: Chief Complaint, VITAL SIGNS/ANCILLARY NOTES   Last Updated: 27-Feb-14 20:32 by Loistine Simas (MD)

## 2014-12-12 NOTE — H&P (Signed)
PATIENT NAME:  Angie [name], Angie [name] MR#:  [id number] DATE OF BIRTH:  05-09-1956  DATE OF ADMISSION:  10/16/2012  PRIMARY CARE PHYSICIAN: [facility].   ADMITTING PHYSICIAN: Dr. Pat Patrick.  CHIEF COMPLAINT: Abdominal pain, nausea and vomiting.  BRIEF HISTORY: The patient is a 59 year old woman seen in the Emergency Room with a 2-day history of fever, abdominal pain, nausea and vomiting. She was in her normal state of good health until approximately 48 hours ago when she developed profound fever. The fever was unresponsive to Tylenol. Several hours later, she began to develop abdominal pain followed by profound nausea. She vomited almost constantly yesterday, has not vomited anything since early this morning. She has been able to keep liquids on her stomach. She remains anorexic. She denies any diarrhea or constipation. She continues to have fever and is taking Tylenol almost around the clock since the onset of symptoms.   She denies any previous similar symptoms. She denies any previous abdominal problems.   PAST MEDICAL HISTORY: She has a history of hepatitis, yellow jaundice except as a child, pancreatitis, peptic ulcer disease, previous diagnosis of gallbladder disease or diverticulitis. She denies any previous abdominal surgery. She has no history of cardiac disease, hypertension, diabetes or thyroid disease.   MEDICATIONS: She takes no prescription medications regularly.   ALLERGIES: No allergies.   SOCIAL HISTORY:  She smokes cigarettes regularly but has not smoked in several days. She does drink alcohol on a regular basis but not to excess. She has no drug dependence. She recently moved to [facility] from [place]. She currently lives with her mother.   REVIEW OF SYSTEMS: Otherwise unremarkable. A 10-point review of systems was carried out with the patient and there are no significant abnormalities identified.   FAMILY HISTORY: Noncontributory.   PHYSICAL EXAMINATION:  GENERAL: She is  an alert, comfortable woman in minimal distress at the present time. She has received pain medication.  VITAL SIGNS: Temperature is 97, blood pressure 144/65, heart rate 91 and regular, oxygen saturation 93%.  HEENT: Unremarkable, other than she feels quite warm. She has no scleral icterus. No pupillary abnormalities. No facial deformities. NECK: Supple without adenopathy or tenderness. Trachea is midline.  CHEST: Clear with no adventitious sounds. She has normal pulmonary excursion.  CARDIAC: Reveals a II/VI systolic murmur heard best along the left sternal border with no gallop rhythms noted. She has no arrhythmias noted.  ABDOMEN: Generally soft with no significant abdominal tenderness. No distention. No rebound. No guarding. No masses. No hernias noted. She has good femoral pulses.  EXTREMITIES: Reveal full range of motion, normal distal pulses and no deformities.  PSYCHIATRIC: Normal orientation, normal affect.   IMPRESSION AND PLAN: This woman presents with a 2-day history of abdominal pain, nausea, vomiting and fever. It is unclear as to the source of her problems. Laboratory values demonstrate white count of 6000, hemoglobin of 14.7. Electrolytes are largely unremarkable with a potassium of 3.3. Liver function tests are slightly elevated with a bilirubin of 1.5, transaminases of 224 and 204, normal lipase. Ultrasound demonstrated no evidence of any gallbladder wall thickening, no stones, no pericholecystic fluid. Because her pain was partly right lower quadrant in nature, a CT was obtained which demonstrated some mild gallbladder wall thickening and mild pericholecystic fluid. Because of the conflicting studies and her significant fever and abdominal pain, the surgical service was consulted.   I do not see any surgical indications. Of note is the fact that her platelet count is 58,000 and in  combination with her abnormal liver function studies, I am not certain what that means. At the present  time, she does not have a surgical abdomen. Will admit her to the hospital, get hepatitis screen, ask the medical doctors to assist [place] with thrombocytopenia workup, rehydrate her and get a HIDA scan. I still do not believe this situation represents acute biliary tract disease, but will rule that out with further investigation. However, with her fever, nausea, vomiting and abdominal pain, I think she warrants observation. This plan has been discussed with the patient and her mother and they are in agreement.   ____________________________ Micheline Maze, MD rle:jm D: 10/16/2012 20:53:52 ET T: 10/16/2012 21:12:26 ET JOB#: [id number]  cc: Rodena Goldmann III, MD, <Dictator> Rodena Goldmann MD ELECTRONICALLY SIGNED 10/16/2012 23:19

## 2014-12-12 NOTE — Consult Note (Signed)
Brief Consult Note: Diagnosis: Abdominal pain, NV.   Patient was seen by consultant.   Consult note dictated.   Comments: Appreciate consult for 59 y/o caucasian woman with history of OA, for evaluation of abdominal pain/elevated LFTs, new finding Hepatitis C. Was admitted after 2d episode of ruq/umbilical abdominal pain, nausea and vomiting. Gall bladder issues were ruled, out but was found to have gram neg rods in blood and e coli in urine; has been on antibiotics and states she currently is feeling much better & has no further GI symptoms. Denies black/bloody/tarry stools, and all other GI complaints. No history of EGD, reports colonoscopy with negative findings 2010 done in [facility]. 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 Father has stage IV liver cancer. Denies intranasal cocaine, IVDU, incarceration, military service, history of dialysis, blood transfusion. Denies history of jaundice, ascites, other viral hepatitis.  Impression and Plan: Hepatitis C. Will obtain viral load and genotyping. Have noted improvement in hepatic panel with current therapy. This is likely reactive hepatopathry secondary to gram negative sepitcemia in the setting of chronic hepatitis C. Do recommend abstaining from etoh.  Electronic Signatures: Stephens November H (NP)  (Signed 27-Feb-14 17:46)  Authored: Brief Consult Note   Last Updated: 27-Feb-14 17:46 by Theodore Demark (NP)

## 2014-12-12 NOTE — Consult Note (Signed)
PATIENT NAME:  Angie [name], Angie [name] MR#:  [id number] DATE OF BIRTH:  03-12-1956  DATE OF CONSULTATION:    REFERRING PHYSICIAN:  Dr. Pat Patrick. CONSULTING PHYSICIAN:  Theodore Demark, NP  HISTORY OF PRESENT ILLNESS:  Appreciate consult for a 59 year old Caucasian woman with history of osteoarthritis admitted for abdominal pain, nausea and vomiting for evaluation of abdominal pain, elevated LFTs, new finding of hepatitis C.  This consult was ordered by Dr. Pat Patrick.  The patient was admitted after two day episode of right upper quadrant umbilical abdominal pain, nausea and vomiting.  Gallbladder issues were ruled out, but she was found to have gram-negative rods in blood and E. coli in urine, has been on antibiotics and states that she is currently feeling much better and has absolutely no further GI symptoms.  Denies black, bloody tarry stools and all other GI complaints.  No history of EGD.  Report colonoscopy with negative findings in 2010, done in [place].  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  She has been to [place] and [place].  She endorses 2 to 3 glasses of wine nightly.  Father has stage IV liver cancer.  Denies intranasal cocaine, IVDU, incarceration, military service, history of dialysis, blood transfusions.  Denies history of jaundice, ascites and other viral hepatitis.   PAST MEDICAL HISTORY:  Osteoarthritis.   MEDICATIONS:  None.   ALLERGIES:  No known drug allergies.   SOCIAL HISTORY:  Endorses 1 pack of cigarettes over six days, however has not done this in several days.  Endorses 2 to 3 glasses of wine nightly.  Denies illicits.  Lives with mother at present.   FAMILY HISTORY:  Father with liver cancer, otherwise negative for colorectal cancer, colon polyps, peptic ulcer disease.   REVIEW OF SYSTEMS:  A 10 point system was carried out and was unremarkable other than what is noted above.    LABORATORY DATA:  Most recent  labs:  Glucose 137, BUN 8, creatinine 0.85, sodium 141, potassium 4.2, chloride 111, GFR greater than 60, calcium 7.8, total protein 5.9, albumin 2.4, total bilirubin 0.8, direct bilirubin 0.3, ALP 95, AST 132, ALT 158  WBC 6.1, hemoglobin 11.8, hematocrit 35.4, platelet count 46, PT 16, INR 1.3.  Hepatitis A negative.  Hepatitis B negative.  Surface antigen negative.  Surface antibody, negative core antibody.  Hepatitis C positive antibody.  Blood culture, gram-positive rods.  Urine with E. coli.  Ultrasound demonstrated a normal liver.  No signs of cholecystitis.  HIDA demonstrated an ejection fraction of 81%, however with some residual testing agent demonstrating decreased liver function.  CT significant for hepatocellular disease versus gallbladder without other acute abnormality.   PHYSICAL EXAMINATION: VITAL SIGNS:  Most recent vital signs:  Temperature 97.9, pulse 78, respiratory rate 20, blood pressure 137/85, SaO2 97%.  GENERAL:  Well-appearing, pleasant, middle-aged woman in no acute distress.  HEENT:  Normocephalic, atraumatic.  Sclerae clear.  No icterus.  Orally mucous membranes pink and moist.  NECK:  Supple.  No adenopathy, tenderness, JVD.  CHEST:  Respirations eupneic.  Lungs CTAB.  CARDIAC:  S1, S2, RRR.  No MRG.  No appreciable edema.  ABDOMEN:  Flat.  Bowel sounds x 4.  Soft, nondistended, nontender.  No guarding, rigidity, peritoneal signs, rebound tenderness or other abnormalities noted.  GENITOURINARY:  Deferred.  RECTAL:  Deferred.  EXTREMITIES:  Gait steady.  Strength 5 out of 5.  MAEW x 4.  Sensation intact.  No clubbing or cyanosis.  SKIN:  Warm, dry, pink.  No jaundice, erythema, lesion or rash.  PSYCHIATRIC:  Pleasant, calm, cooperative, logical train of thought.   IMPRESSION AND PLAN:   1.  Hepatitis C.  We will obtain viral load and genotyping.  2.  Elevated liver function tests.  Have noted improvement in hepatic panel with current therapy.  This is likely reactive  hepatopathy secondary to gram-negative septicemia in the setting of chronic hepatitis C.  Do recommend abstaining from ETOH.  Further recommendations to follow.   Thank you for this consult.  These services were provided by Stephens November, MSN, [place] in collaboration with Lollie Sails, M.D. with whom I have discussed this patient in full.      ____________________________ Theodore Demark, NP chl:ea D: 10/18/2012 17:52:56 ET T: 10/18/2012 18:29:39 ET JOB#: [id number]  cc: Theodore Demark, NP, <Dictator> [place] SIGNED 10/24/2012 12:04

## 2014-12-13 NOTE — Discharge Summary (Signed)
PATIENT NAME:  Angie [name], Angie [name] MR#:  [id number] DATE OF BIRTH:  [birth year]  DATE OF ADMISSION:  03/06/2014. DATE OF DISCHARGE:  03/09/2014.  ADMITTING DIAGNOSIS: Left knee osteoarthritis.   DISCHARGE DIAGNOSIS:  Left knee osteoarthritis.   OPERATION: On 03/06/2014 the patient had a left total knee replacement.   SURGEON: Hessie Knows, M.D.   ASSISTANT: Reche Dixon, PA-C.   ANESTHESIA: General.   ESTIMATED BLOOD LOSS: 300 mL.   COMPLICATIONS: None.   IMPLANTS: GMK primary left 3 fixed tibial tray, size 2 resurfacing patella, GMK primary severe left 3 femur, and a 3 left 10-mm flexible insert. The patient was stabilized, brought to the recovery room, and brought to the orthopedic floor.   HISTORY AND PHYSICAL:  The patient is a 59 year old female who presented for continued pain involving her left knee. The patient has been refractory to conservative treatment including anti-inflammatories, steroid injections, Synvisc injections and activity modification. The patient continued having pain and having difficulties with ambulation.   PHYSICAL EXAMINATION:  GENERAL: Alert female with some discomfort with ambulation and transfers.  CARDIAC: Regular rate and rhythm.  LUNGS: Clear to auscultation.  MUSCULOSKELETAL: In regard to the left lower extremity, the patient has tenderness to palpation along the medial and lateral joint lines as well as the patellar tendon. The patient has some opening with valgus stress testing. The patient has range of motion of -5 degrees and extension to 100 degrees of flexion. The patient has a benign hip exam. The patient had x-rays revealing tricompartmental degenerative joint disease.   HOSPITAL COURSE: After initial admission on 03/06/2014 the patient was watched for history of low platelets. The patient did receive 1 transfusion and 1 platelet transfusion before surgery and did well with this. The patient on the day of surgery had a platelet level of 86,000 and  after transfusion it was up to 101,000 and then dropped back down to 69,000 on postoperative day 2 and it is 71,000 on day postoperative day 3. The patient worked with physical therapy and initially with bed to chair and progressed up to ambulating 150 feet and has been comfortable with range of motion activities. The patient was ready to go home with home health physical therapy on 03/09/2014.  DISCHARGE INSTRUCTIONS:  The patient will follow up with [facility] in about 2 weeks for staple removal. The patient will do weight-bearing as tolerated on the affected leg. The patient will use 1 to 2 pillows under her foot and use thigh-high TED hose on both legs, removed at bedtime. The patient will elevate her heels off the bed and use incentive spirometer every hour while awake. She has been encouraged to do coughing and deep breathing. The patient will resume regular diet. She should use Polar Care to decrease swelling. The patient should try to keep her dressing clean and dry, try not to get it wet. The patient will call the clinic if there is any bright red bleeding, calf pain, bowel or bladder difficulty, or any fever greater than 101.5. The patient will do home health physical therapy working on gait training and range of motion activities.   DISCHARGE MEDICATIONS: Resume home medications and add tramadol 1 tablet every 4 hours as needed for less severe pain, Oxycodone 5 mg 1 tablet q.4 hours as needed for more severe pain.     ____________________________ Lenna Sciara. Reche Dixon, [place] jtm:lt D: 03/09/2014 07:10:48 ET T: 03/09/2014 09:10:14 ET JOB#: [id number]  cc: J. Reche Dixon, [place], <Dictator> J Margarete Horace [facility]  PA ELECTRONICALLY SIGNED 03/31/2014 8:06

## 2014-12-13 NOTE — Op Note (Signed)
PATIENT NAME:  Angie [name], Angie [name] MR#:  [id number] DATE OF BIRTH:  02-04-1956  DATE OF PROCEDURE:  03/06/2014  PREOPERATIVE DIAGNOSIS: Left knee osteoarthritis.   POSTOPERATIVE DIAGNOSIS: Left knee osteoarthritis.   PROCEDURE: Left total knee replacement.   SURGEON: Hessie Knows, M.D.   ASSISTANT:  Reche Dixon PA-C.   ANESTHESIA: General.   DESCRIPTION OF PROCEDURE: The patient was brought to the operating room and after adequate anesthesia was obtained, the left leg was prepped and draped in the usual sterile fashion with a tourniquet applied to the upper thigh, after appropriate patient identification and timeout procedures were completed.   A midline skin incision was made, followed by a medial parapatellar arthrotomy, excision of the fat pad, ACL and PCL. After exposure, the tibial cutting block was applied and proximal tibia cut carried out. At this point, there excessive bleeding from the bone and the tourniquet was raised to 300 mmHg. After removal of the proximal tibia cut leaving the collaterals intact, the cutting guide for the distal femur was placed. After removing some residual cartilage, the distal cut was carried out. The #3 cutting block applied, anterior, posterior and chamfer cuts made. At this point, the residual posterior horns of the medial and lateral menisci were excised and the size 3 tibia tray was placed with a keel punch to hold it in place.  The 3 femoral trial was placed and with a 10 mm insert, there was good stability medially and flexion and extension.   The distal femoral drill holes were made through the trial and these initial trial components removed. The femoral trochlear cut was then created using osteotome. The patella was cut using a freehand technique and after drilling is noted to be a size 2 with 3 peg holes made. At this point, the tourniquet was let down and hemostasis checked with electrocautery. The periarticular tissue was infiltrated with dilute  Exparel and also a combination of Sensorcaine with morphine.  After the injections had been completed in the periarticular tissue for postoperative analgesia, the   tourniquet was raised and the bony surfaces thoroughly irrigated and dried. The tibial component was cemented into place first with excess cement removed, followed by a tibial trial. The femoral component was placed and again excess cement removed with the knee in extension. The patellar button clamped into place.  Again, all components cemented. After the cement had set, excess cement was removed because of the patient's low platelet count, bone wax was placed around the edges of the femur, which were not covered by the implant to minimize postoperative bleeding. The knee was again thoroughly irrigated. The final 10 mm insert was placed and set screw placed. The patella tracked well with no touch technique. The arthrotomy was repaired using a heavy quill suture, 2-0 quill subcutaneously and skin staples. Xeroform, 4 x 4's, ABD, Webril and Ace wrap along with Polar Care were applied followed by a knee immobilizer. The patient was sent to the recovery room in stable condition.   ESTIMATED BLOOD LOSS: 300 mL.   COMPLICATIONS: None.   SPECIMEN: Cut ends of bone.   TOURNIQUET TIME: 74 minutes.   IMPLANTS: GMK primary left 3 fixed tibial tray, size 2 resurfacing patella, GMK primary sphere left 3 femur, and a 3 left 10 mm flex insert.    Additionally with regards to findings, there was exposed bone on the medial compartment, as well as exposed bone on the lateral compartment of the femur.  It was consistent with severe osteoarthritis,  as well as significant patellofemoral degenerative change.    ____________________________ Laurene Footman, MD mjm:ds D: 03/06/2014 17:57:59 ET T: 03/06/2014 19:09:14 ET JOB#: [id number]  cc: Laurene Footman, MD, <Dictator> Laurene Footman MD ELECTRONICALLY SIGNED 03/10/2014 22:30

## 2015-01-21 ENCOUNTER — Other Ambulatory Visit: Payer: Self-pay | Admitting: *Deleted

## 2015-01-21 DIAGNOSIS — D696 Thrombocytopenia, unspecified: Secondary | ICD-10-CM

## 2015-01-22 ENCOUNTER — Encounter (INDEPENDENT_AMBULATORY_CARE_PROVIDER_SITE_OTHER): Payer: Self-pay

## 2015-01-22 ENCOUNTER — Inpatient Hospital Stay: Payer: Medicaid Other | Attending: Oncology

## 2015-01-22 ENCOUNTER — Inpatient Hospital Stay (HOSPITAL_BASED_OUTPATIENT_CLINIC_OR_DEPARTMENT_OTHER): Payer: Medicaid Other | Admitting: Oncology

## 2015-01-22 ENCOUNTER — Encounter: Payer: Self-pay | Admitting: Oncology

## 2015-01-22 VITALS — BP 137/92 | HR 67 | Temp 95.4°F | Resp 18 | Wt 164.5 lb

## 2015-01-22 DIAGNOSIS — Z79899 Other long term (current) drug therapy: Secondary | ICD-10-CM | POA: Diagnosis not present

## 2015-01-22 DIAGNOSIS — F17210 Nicotine dependence, cigarettes, uncomplicated: Secondary | ICD-10-CM | POA: Diagnosis not present

## 2015-01-22 DIAGNOSIS — Z8619 Personal history of other infectious and parasitic diseases: Secondary | ICD-10-CM | POA: Insufficient documentation

## 2015-01-22 DIAGNOSIS — Z87442 Personal history of urinary calculi: Secondary | ICD-10-CM | POA: Diagnosis not present

## 2015-01-22 DIAGNOSIS — Z87440 Personal history of urinary (tract) infections: Secondary | ICD-10-CM | POA: Diagnosis not present

## 2015-01-22 DIAGNOSIS — D696 Thrombocytopenia, unspecified: Secondary | ICD-10-CM

## 2015-01-22 DIAGNOSIS — Z800 Family history of malignant neoplasm of digestive organs: Secondary | ICD-10-CM

## 2015-01-22 LAB — CBC WITH DIFFERENTIAL/PLATELET
BASOS ABS: 0 10*3/uL (ref 0–0.1)
Basophils Relative: 1 %
EOS ABS: 0.2 10*3/uL (ref 0–0.7)
HCT: 44.6 % (ref 35.0–47.0)
HEMOGLOBIN: 15.1 g/dL (ref 12.0–16.0)
Lymphs Abs: 1.6 10*3/uL (ref 1.0–3.6)
MCH: 31.7 pg (ref 26.0–34.0)
MCHC: 34 g/dL (ref 32.0–36.0)
MCV: 93.3 fL (ref 80.0–100.0)
Monocytes Absolute: 0.6 10*3/uL (ref 0.2–0.9)
Monocytes Relative: 10 %
NEUTROS ABS: 3.9 10*3/uL (ref 1.4–6.5)
Neutrophils Relative %: 61 %
Platelets: 96 10*3/uL — ABNORMAL LOW (ref 150–440)
RBC: 4.78 MIL/uL (ref 3.80–5.20)
RDW: 13 % (ref 11.5–14.5)
WBC: 6.3 10*3/uL (ref 3.6–11.0)

## 2015-02-16 ENCOUNTER — Encounter: Payer: Self-pay | Admitting: *Deleted

## 2015-02-16 ENCOUNTER — Emergency Department
Admission: EM | Admit: 2015-02-16 | Discharge: 2015-02-16 | Disposition: A | Discharge status: Home / Self Care | Payer: Medicaid Other | Attending: Emergency Medicine | Admitting: Emergency Medicine

## 2015-02-16 DIAGNOSIS — N390 Urinary tract infection, site not specified: Secondary | ICD-10-CM | POA: Insufficient documentation

## 2015-02-16 DIAGNOSIS — R109 Unspecified abdominal pain: Secondary | ICD-10-CM | POA: Diagnosis present

## 2015-02-16 DIAGNOSIS — N200 Calculus of kidney: Secondary | ICD-10-CM | POA: Diagnosis not present

## 2015-02-16 DIAGNOSIS — Z720 Tobacco use: Secondary | ICD-10-CM | POA: Diagnosis not present

## 2015-02-16 HISTORY — DX: Calculus of kidney: N20.0

## 2015-02-16 LAB — CBC WITH DIFFERENTIAL/PLATELET
BASOS ABS: 0 10*3/uL (ref 0–0.1)
EOS ABS: 0.1 10*3/uL (ref 0–0.7)
Eosinophils Relative: 1 %
HCT: 42.4 % (ref 35.0–47.0)
Hemoglobin: 14.7 g/dL (ref 12.0–16.0)
Lymphocytes Relative: 16 %
Lymphs Abs: 1.2 10*3/uL (ref 1.0–3.6)
MCH: 32.5 pg (ref 26.0–34.0)
MCHC: 34.7 g/dL (ref 32.0–36.0)
MCV: 93.7 fL (ref 80.0–100.0)
Monocytes Absolute: 0.5 10*3/uL (ref 0.2–0.9)
NEUTROS ABS: 5.8 10*3/uL (ref 1.4–6.5)
Platelets: 83 10*3/uL — ABNORMAL LOW (ref 150–440)
RBC: 4.52 MIL/uL (ref 3.80–5.20)
RDW: 12.7 % (ref 11.5–14.5)
WBC: 7.5 10*3/uL (ref 3.6–11.0)

## 2015-02-16 LAB — COMPREHENSIVE METABOLIC PANEL
ALBUMIN: 4.6 g/dL (ref 3.5–5.0)
ALK PHOS: 110 U/L (ref 38–126)
ALT: 30 U/L (ref 14–54)
ANION GAP: 8 (ref 5–15)
AST: 29 U/L (ref 15–41)
BUN: 16 mg/dL (ref 6–20)
CO2: 27 mmol/L (ref 22–32)
Calcium: 9.4 mg/dL (ref 8.9–10.3)
Chloride: 111 mmol/L (ref 101–111)
Creatinine, Ser: 0.68 mg/dL (ref 0.44–1.00)
GFR calc Af Amer: >60 mL/min (ref >60)
Glucose, Bld: 117 mg/dL — ABNORMAL HIGH (ref 65–99)
POTASSIUM: 4.1 mmol/L (ref 3.5–5.1)
SODIUM: 146 mmol/L — AB (ref 135–145)
Total Bilirubin: 0.4 mg/dL (ref 0.3–1.2)
Total Protein: 7.6 g/dL (ref 6.5–8.1)

## 2015-02-16 LAB — URINALYSIS COMPLETE WITH MICROSCOPIC (ARMC ONLY)
Bilirubin Urine: NEGATIVE
Glucose, UA: NEGATIVE mg/dL
Ketones, ur: NEGATIVE mg/dL
NITRITE: NEGATIVE
PROTEIN: 30 mg/dL — AB
Specific Gravity, Urine: 1.023 (ref 1.005–1.030)
pH: 6 (ref 5.0–8.0)

## 2015-02-16 LAB — LIPASE, BLOOD: LIPASE: 37 U/L (ref 22–51)

## 2015-02-16 MED ORDER — OXYCODONE-ACETAMINOPHEN 5-325 MG PO TABS
ORAL_TABLET | ORAL | Status: AC
  Administered 2015-02-16: 1 via ORAL
  Filled 2015-02-16: qty 1

## 2015-02-16 MED ORDER — ONDANSETRON HCL 4 MG PO TABS: 4.0000 mg | ORAL_TABLET | Freq: Three times a day (TID) | ORAL | Status: DC | PRN

## 2015-02-16 MED ORDER — ONDANSETRON HCL 4 MG/2ML IJ SOLN
4.0000 mg | Freq: Once | INTRAMUSCULAR | Status: AC
  Administered 2015-02-16: 4 mg via INTRAVENOUS

## 2015-02-16 MED ORDER — SODIUM CHLORIDE 0.9 % IV BOLUS (SEPSIS)
1000.0000 mL | Freq: Once | INTRAVENOUS | Status: AC
  Administered 2015-02-16: 1000 mL via INTRAVENOUS

## 2015-02-16 MED ORDER — SULFAMETHOXAZOLE-TRIMETHOPRIM 800-160 MG PO TABS
1.0000 | ORAL_TABLET | Freq: Once | ORAL | Status: AC
  Administered 2015-02-16: 1 via ORAL

## 2015-02-16 MED ORDER — OXYCODONE-ACETAMINOPHEN 5-325 MG PO TABS
1.0000 | ORAL_TABLET | Freq: Once | ORAL | Status: AC
  Administered 2015-02-16: 1 via ORAL

## 2015-02-16 MED ORDER — HYDROMORPHONE HCL 1 MG/ML IJ SOLN
INTRAMUSCULAR | Status: AC
  Administered 2015-02-16: 1 mg via INTRAVENOUS
  Filled 2015-02-16: qty 1

## 2015-02-16 MED ORDER — HYDROMORPHONE HCL 1 MG/ML IJ SOLN
1.0000 mg | Freq: Once | INTRAMUSCULAR | Status: AC
  Administered 2015-02-16: 1 mg via INTRAVENOUS

## 2015-02-16 MED ORDER — ONDANSETRON HCL 4 MG/2ML IJ SOLN
INTRAMUSCULAR | Status: AC
  Administered 2015-02-16: 4 mg via INTRAVENOUS
  Filled 2015-02-16: qty 2

## 2015-02-16 MED ORDER — SULFAMETHOXAZOLE-TRIMETHOPRIM 800-160 MG PO TABS
ORAL_TABLET | ORAL | Status: AC
  Administered 2015-02-16: 1 via ORAL
  Filled 2015-02-16: qty 1

## 2015-02-16 MED ORDER — SULFAMETHOXAZOLE-TRIMETHOPRIM 800-160 MG PO TABS: 1.0000 | ORAL_TABLET | Freq: Two times a day (BID) | ORAL | Status: DC

## 2015-02-16 NOTE — ED Notes (Signed)
Pt reports immediate relief of pain after med admin

## 2015-02-16 NOTE — ED Provider Notes (Signed)
[facility] Emergency Department Provider Note   ____________________________________________  Time seen: 7:10 AM I have reviewed the triage vital signs and the triage nursing note.  HISTORY  Chief Complaint Flank Pain   Historian Patient  HPI Angie [name] is a 59 y.o. female is having left-sided flank pain that started on Saturday. She was diagnosed on Saturday at an ER in [place] with a left-sided 8 x 3 mm kidney stone distal. She was discharged with oxycodone, Flomax, and Toradol. She had taken the Toradol and since it worked she did not take any oxycodone. She woke up at 3 AM this morning in severe pain and is presenting here to emergency department for further treatment. She has a primary care physician follow-up appointment scheduled for tomorrow. She is from this area. Pain is considered sharp and was severe upon arrival.    Past Medical History  Diagnosis Date  . Hepatitis C   . Kidney stone     There are no active problems to display for this patient.   Past Surgical History  Procedure Laterality Date  . Tonsillectomy    . Total knee arthroplasty      Current Outpatient Rx  Name  Route  Sig  Dispense  Refill  . Biotin 1 MG CAPS   Oral   Take by mouth.         . ondansetron (ZOFRAN) 4 MG tablet   Oral   Take 1 tablet (4 mg total) by mouth every 8 (eight) hours as needed for nausea or vomiting.   10 tablet   1   . sulfamethoxazole-trimethoprim (BACTRIM DS,SEPTRA DS) 800-160 MG per tablet   Oral   Take 1 tablet by mouth 2 (two) times daily.   14 tablet   0   . traMADol (ULTRAM) 50 MG tablet      1 tablet by mouth every 4 to 6 hours as needed for pain          Toradol, Flomax, oxycodone recently prescribed by ER this weekend for kidney stones Bactrim and Zofran prescribed by me at the end of this visit  Allergies Aspirin and Nsaids  History reviewed. No pertinent family history.  Social History History  Substance  Use Topics  . Smoking status: Current Some Day Smoker    Types: Cigarettes  . Smokeless tobacco: Never Used  . Alcohol Use: Yes     Comment: occasionally    Review of Systems  Constitutional: Negative for fever. Eyes: Negative for visual changes. ENT: Negative for sore throat. Cardiovascular: Negative for chest pain. Respiratory: Negative for shortness of breath. Gastrointestinal: Negative for diarrhea. Genitourinary: Positive dysuria Musculoskeletal: Negative for back pain. Skin: Negative for rash. Neurological: Negative for headaches, focal weakness or numbness. 10 point Review of Systems otherwise negative ____________________________________________   PHYSICAL EXAM:  VITAL SIGNS: ED Triage Vitals  Enc Vitals Group     BP 02/16/15 0632 157/86 mmHg     Pulse Rate 02/16/15 0632 94     Resp 02/16/15 0632 18     Temp --      Temp src --      SpO2 02/16/15 0632 95 %     Weight --      Height --      Head Cir --      Peak Flow --      Pain Score 02/16/15 0511 10     Pain Loc --      Pain Edu? --  Excl. in [place]? --      Constitutional: Alert and oriented. Well appearing and in no distress. Eyes: Conjunctivae are normal. PERRL. Normal extraocular movements. ENT   Head: Normocephalic and atraumatic.   Nose: No congestion/rhinnorhea.   Mouth/Throat: Mucous membranes are moist.   Neck: No stridor. Cardiovascular/Chest: Normal rate, regular rhythm.  No murmurs, rubs, or gallops. Respiratory: Normal respiratory effort without tachypnea nor retractions. Breath sounds are clear and equal bilaterally. No wheezes/rales/rhonchi. Gastrointestinal: Soft. No distention, no guarding, no rebound. Nontender abdomen  Genitourinary/rectal:Deferred Musculoskeletal: Nontender with normal range of motion in all extremities. No joint effusions.  No lower extremity tenderness nor edema. Neurologic:  Normal speech and language. No gross focal neurologic deficits are  appreciated. Skin:  Skin is warm, dry and intact. No rash noted. Psychiatric: Mood and affect are normal. Speech and behavior are normal. Patient exhibits appropriate insight and judgment.  ____________________________________________   EKG I, Lisa Roca, MD, the attending physician have personally viewed and interpreted all ECGs.  None ____________________________________________  LABS (pertinent positives/negatives)  CBC within normal limits Metabolic panel within normal limits except for sodium 146. Urinalysis trace leukocytes, too numerous to count red blood cells, 6-30 white blood cells and rare bacteria  ____________________________________________  RADIOLOGY All Xrays were viewed by me. Imaging interpreted by Radiologist.  None __________________________________________  PROCEDURES  Procedure(s) performed: None Critical Care performed: None  ____________________________________________   ED COURSE / ASSESSMENT AND PLAN  CONSULTATIONS: None  Pertinent labs & imaging results that were available during my care of the patient were reviewed by me and considered in my medical decision making (see chart for details).   Patient was symptomatically controlled down to 3 out of 10 pain scale after Dilaudid and Zofran were given. Patient does not show any signs of kidney failure. She was instructed to go ahead and take her narcotic pain medication at home rather than wait till he gets out of control with the pain. Her urinalysis shows some concern for possible infection, and she was started on Bactrim. A culture was sent. I am adding Bactrim, and Zofran in addition to the perceptions that she has at home which are Flomax, Toradol, and oxycodone.  Patient / Family / Caregiver informed of clinical course, medical decision-making process, and agree with plan.   I discussed return precautions, follow-up instructions, and discharged instructions with patient and/or  family.  ___________________________________________   FINAL CLINICAL IMPRESSION(S) / ED DIAGNOSES   Final diagnoses:  Kidney stone   urinary tract infection.  FOLLOW UP  Referred to: Primary care physician, and the urologist   Lisa Roca, MD 02/16/15 [phone]

## 2015-02-16 NOTE — Discharge Instructions (Signed)
I'm adding an antibiotic for possible urinary tract infection. I'm also going to add a nausea medicine called Zofran. Keep your primary care appointment this week. Make an appointment with the urologist for about one week from now if you have not passed the stone by then.  Return to the emergency department for any new or worsening condition including inability to urinate, fever, new or worsening pain, vomiting and cannot keep her medications down, or any other symptoms concerning to you.  Kidney Stones Kidney stones (urolithiasis) are solid masses that form inside your kidneys. The intense pain is caused by the stone moving through the kidney, ureter, bladder, and urethra (urinary tract). When the stone moves, the ureter starts to spasm around the stone. The stone is usually passed in your pee (urine).  HOME CARE  Drink enough fluids to keep your pee clear or pale yellow. This helps to get the stone out.  Strain all pee through the provided strainer. Do not pee without peeing through the strainer, not even once. If you pee the stone out, catch it in the strainer. The stone may be as small as a grain of salt. Take this to your doctor. This will help your doctor figure out what you can do to try to prevent more kidney stones.  Only take medicine as told by your doctor.  Follow up with your doctor as told.  Get follow-up X-rays as told by your doctor. GET HELP IF: You have pain that gets worse even if you have been taking pain medicine. GET HELP RIGHT AWAY IF:   Your pain does not get better with medicine.  You have a fever or shaking chills.  Your pain increases and gets worse over 18 hours.  You have new belly (abdominal) pain.  You feel faint or pass out.  You are unable to pee. MAKE SURE YOU:   Understand these instructions.  Will watch your condition.  Will get help right away if you are not doing well or get worse. Document Released: 01/25/2008 Document Revised: 04/10/2013  Document Reviewed: 01/09/2013 [facility] Patient Information 2015 [place], [place]. This information is not intended to replace advice given to you by your health care provider. Make sure you discuss any questions you have with your health care provider.

## 2015-02-16 NOTE — ED Notes (Signed)
IV Removed Left AC LM EDT

## 2015-02-16 NOTE — ED Notes (Signed)
Pt uprite on stretcher in exam room, appears uncomfortable, grimacing, dry heaving; reports left flank pain radiating into lower lower abd; dx with 11mmx3mm kidney stone on Saturday in [place] while visiting family; denies any hx of stone; st rx flomax/toradol/oxycodone; st has had intermittent difficulty urinating as well

## 2015-02-16 NOTE — ED Notes (Signed)
Pt presents w/ c/o L flank pain related to kidney stone that was diagnosed and treated on Saturday. Pt has kidney stone that is 68mm x 75mm.

## 2015-02-17 ENCOUNTER — Ambulatory Visit
Admission: AD | Admit: 2015-02-17 | Discharge: 2015-02-17 | Disposition: A | Discharge status: Home / Self Care | Payer: Medicaid Other | Source: Ambulatory Visit | Attending: Urology | Admitting: Urology

## 2015-02-17 ENCOUNTER — Ambulatory Visit
Admission: RE | Admit: 2015-02-17 | Discharge: 2015-02-17 | Disposition: A | Discharge status: Home / Self Care | Payer: Medicaid Other | Source: Ambulatory Visit | Attending: Urology | Admitting: Urology

## 2015-02-17 ENCOUNTER — Ambulatory Visit (INDEPENDENT_AMBULATORY_CARE_PROVIDER_SITE_OTHER): Payer: Medicaid Other | Admitting: Urology

## 2015-02-17 DIAGNOSIS — N200 Calculus of kidney: Secondary | ICD-10-CM | POA: Diagnosis present

## 2015-02-17 DIAGNOSIS — N132 Hydronephrosis with renal and ureteral calculous obstruction: Secondary | ICD-10-CM | POA: Insufficient documentation

## 2015-02-17 MED ORDER — OXYCODONE-ACETAMINOPHEN 5-325 MG PO TABS: 1.0000 | ORAL_TABLET | ORAL | Status: DC | PRN

## 2015-02-17 MED ORDER — TAMSULOSIN HCL 0.4 MG PO CAPS: 0.4000 mg | ORAL_CAPSULE | Freq: Every day | ORAL | Status: DC

## 2015-02-17 NOTE — Progress Notes (Signed)
02/17/2015 3:38 PM   Angie [name] Dec 20, 1955 [id number]  Referring provider: Ronnell Freshwater, NP [place], [place] [id number]  No chief complaint on file.   HPI:  1 - Nephrolithiasis - 76m fusiform left distal stone by ER eval in [place] 01/2015 by CT on eval flank pain and some irritative voiding sympotms. Only punctate ipsilateral intrarenal. No prior stones. UA without infectious parameters. Presently on trial of medical therapy with tamsulsoin and percocet.   PMH sig for HepC, Ortho surgery. No CV disease. No strong blood thinners.   Today "Angie [name] is seen as new patient for above.    PMH: Past Medical History  Diagnosis Date  . Hepatitis C   . Kidney stone     Surgical History: Past Surgical History  Procedure Laterality Date  . Tonsillectomy    . Total knee arthroplasty      Home Medications:    Medication List       This list is accurate as of: 02/17/15  3:38 PM.  Always use your most recent med list.               Biotin 1 MG Caps  Take by mouth.     ondansetron 4 MG tablet  Commonly known as:  ZOFRAN  Take 1 tablet (4 mg total) by mouth every 8 (eight) hours as needed for nausea or vomiting.     sulfamethoxazole-trimethoprim 800-160 MG per tablet  Commonly known as:  BACTRIM DS,SEPTRA DS  Take 1 tablet by mouth 2 (two) times daily.     traMADol 50 MG tablet  Commonly known as:  ULTRAM  1 tablet by mouth every 4 to 6 hours as needed for pain        Allergies:  Allergies  Allergen Reactions  . Aspirin Nausea And Vomiting  . Nsaids Other (See Comments)    Liver function/[place]    Family History: No family history on file.  Social History:  reports that she has been smoking Cigarettes.  She has never used smokeless tobacco. She reports that she drinks alcohol. She reports that she does not use illicit drugs.  ROS:    Review of Systems  Gastrointestinal (upper)  : Negative for upper GI  symptoms  Gastrointestinal (lower) : Negative for lower GI symptoms  Constitutional : Negative for symptoms  Skin: Negative for skin symptoms  Eyes: Negative for eye symptoms  Ear/Nose/Throat : Negative for Ear/Nose/Throat symptoms  Hematologic/Lymphatic: Negative for Hematologic/Lymphatic symptoms  Cardiovascular : Negative for cardiovascular symptoms  Respiratory : Negative for respiratory symptoms  Endocrine: Negative for endocrine symptoms  Musculoskeletal: Negative for musculoskeletal symptoms  Neurological: Negative for neurological symptoms  Psychologic: Negative for psychiatric symptoms   Physical Exam: There were no vitals taken for this visit.  Constitutional:  Alert and oriented, No acute distress. HEENT: [place] AT, moist mucus membranes.  Trachea midline, no masses. Cardiovascular: No clubbing, cyanosis, or edema. Respiratory: Normal respiratory effort, no increased work of breathing. GI: Abdomen is soft, nontender, nondistended, no abdominal masses GU: Very mild left CVAT. Skin: No rashes, bruises or suspicious lesions. Lymph: No cervical or inguinal adenopathy. Neurologic: Grossly intact, no focal deficits, moving all 4 extremities. Psychiatric: Normal mood and affect.  Laboratory Data: Lab Results  Component Value Date   WBC 7.5 02/16/2015   HGB 14.7 02/16/2015   HCT 42.4 02/16/2015   MCV 93.7 02/16/2015   PLT 83* 02/16/2015    Lab Results  Component Value Date   CREATININE  0.68 02/16/2015    No results found for: PSA  No results found for: TESTOSTERONE  No results found for: HGBA1C  Urinalysis    Component Value Date/Time   COLORURINE YELLOW* 02/16/2015 0533   APPEARANCEUR HAZY* 02/16/2015 0533   LABSPEC 1.023 02/16/2015 0533   PHURINE 6.0 02/16/2015 0533   GLUCOSEU NEGATIVE 02/16/2015 0533   HGBUR 2+* 02/16/2015 0533   BILIRUBINUR NEGATIVE 02/16/2015 0533   KETONESUR NEGATIVE 02/16/2015 0533   PROTEINUR 30* 02/16/2015  0533   NITRITE NEGATIVE 02/16/2015 0533   LEUKOCYTESUR TRACE* 02/16/2015 0533    Pertinent Imaging: CT report from [place] as per abaove.   Assessment & Plan:    1 - Nephrolithiasis - discussed options of MET, SWL, URS in detail. She is most interested in SWL. Will obtain KUB ASAP and RTC tomorrow and plan to proceed with SWL as long as targetable. Refilled tamsulosin and percocet for continued interval medical therapy. Warned to contact MD for fever >101 or refractory colic symptoms.  2 - RTC tomorrow with KUB prior.   No Follow-up on file.  Alexis Frock, [facility] [address] [place], [place] [address] [phone]

## 2015-02-18 ENCOUNTER — Telehealth: Payer: Self-pay | Admitting: Urology

## 2015-02-18 DIAGNOSIS — N1330 Unspecified hydronephrosis: Secondary | ICD-10-CM

## 2015-02-18 LAB — URINALYSIS, COMPLETE
Bilirubin, UA: NEGATIVE
GLUCOSE, UA: NEGATIVE
Ketones, UA: NEGATIVE
LEUKOCYTES UA: NEGATIVE
Nitrite, UA: NEGATIVE
Protein, UA: NEGATIVE
Specific Gravity, UA: >1.03 — ABNORMAL HIGH (ref 1.005–1.030)
Urobilinogen, Ur: 1 mg/dL (ref 0.2–1.0)
pH, UA: 6 (ref 5.0–7.5)

## 2015-02-18 LAB — MICROSCOPIC EXAMINATION: BACTERIA UA: NONE SEEN

## 2015-02-18 LAB — URINE CULTURE

## 2015-02-18 NOTE — Telephone Encounter (Signed)
Patient has been notified and order for Renal u/s was placed. Please schedule, thanks

## 2015-02-18 NOTE — Telephone Encounter (Signed)
Stone is not visible on KUB.  She will need a RUS to r/o hydronephrosis.

## 2015-02-21 DIAGNOSIS — D693 Immune thrombocytopenic purpura: Secondary | ICD-10-CM | POA: Insufficient documentation

## 2015-02-21 DIAGNOSIS — D696 Thrombocytopenia, unspecified: Secondary | ICD-10-CM | POA: Insufficient documentation

## 2015-02-21 NOTE — Progress Notes (Signed)
[place]  Telephone:(336) [phone] Fax:(336) [phone]  ID: Angie [name] OB: 1955/11/27  MR#: [id number]  RCV#:[id number]  Patient Care Team: Lavera Guise, MD as PCP - General (Internal Medicine)  CHIEF COMPLAINT:  Chief Complaint  Patient presents with  . Follow-up    thrombocytopenia    INTERVAL HISTORY: Patient returns to clinic today for laboratory work and further evaluation. She currently feels well and is asymptomatic. She denies any easy bleeding or bruising. She has no neurologic complaints. She denies any recent fevers or illnesses. She has a good appetite and denies weight loss. She has no chest pain or shortness of breath. She denies any nausea, vomiting, constipation, or diarrhea. She denies melena or hematochezia.  She has no urinary complaints. Patient offers no specific complaints today.  REVIEW OF SYSTEMS:   Review of Systems  Constitutional: Negative.   Endo/Heme/Allergies: Does not bruise/bleed easily.    As per HPI. Otherwise, a complete review of systems is negatve.  PAST MEDICAL HISTORY: Past Medical History  Diagnosis Date  . Hepatitis C   . Kidney stone   . Sepsis due to urinary tract infection 2014    PAST SURGICAL HISTORY: Past Surgical History  Procedure Laterality Date  . Tonsillectomy    . Total knee arthroplasty      FAMILY HISTORY Family History  Problem Relation Age of Onset  . Liver cancer Father        ADVANCED DIRECTIVES:    HEALTH MAINTENANCE: History  Substance Use Topics  . Smoking status: Current Some Day Smoker    Types: Cigarettes  . Smokeless tobacco: Never Used  . Alcohol Use: Yes     Comment: occasionally     Colonoscopy:  PAP:  Bone density:  Lipid panel:  Allergies  Allergen Reactions  . Aspirin Nausea And Vomiting  . Nsaids Other (See Comments)    Liver function/plateles    Current Outpatient Prescriptions  Medication Sig Dispense Refill  . Biotin 1 MG CAPS Take by mouth.     . traMADol (ULTRAM) 50 MG tablet 1 tablet by mouth every 4 to 6 hours as needed for pain    . ketorolac (TORADOL) 10 MG tablet Take 10 mg by mouth every 6 (six) hours as needed.    . ondansetron (ZOFRAN) 4 MG tablet Take 1 tablet (4 mg total) by mouth every 8 (eight) hours as needed for nausea or vomiting. 10 tablet 1  . oxyCODONE-acetaminophen (PERCOCET/ROXICET) 5-325 MG per tablet Take 1-2 tablets by mouth every 4 (four) hours as needed for severe pain. 30 tablet 0  . sulfamethoxazole-trimethoprim (BACTRIM DS,SEPTRA DS) 800-160 MG per tablet Take 1 tablet by mouth 2 (two) times daily. 14 tablet 0  . tamsulosin (FLOMAX) 0.4 MG CAPS capsule Take 1 capsule (0.4 mg total) by mouth daily. 30 capsule 0   No current facility-administered medications for this visit.    OBJECTIVE: Filed Vitals:   01/22/15 1148  BP: 137/92  Pulse: 67  Temp: 95.4 F (35.2 C)  Resp: 18     There is no height on file to calculate BMI.    ECOG FS:0 - Asymptomatic  General: Well-developed, well-nourished, no acute distress. Eyes: anicteric sclera. Lungs: Clear to auscultation bilaterally. Heart: Regular rate and rhythm. No rubs, murmurs, or gallops. Abdomen: Soft, nontender, nondistended. No organomegaly noted, normoactive bowel sounds. Musculoskeletal: No edema, cyanosis, or clubbing. Neuro: Alert, answering all questions appropriately. Cranial nerves grossly intact. Skin: No rashes or petechiae noted. Psych: Normal affect.  LAB RESULTS:  Lab Results  Component Value Date   NA 146* 02/16/2015   K 4.1 02/16/2015   CL 111 02/16/2015   CO2 27 02/16/2015   GLUCOSE 117* 02/16/2015   BUN 16 02/16/2015   CREATININE 0.68 02/16/2015   CALCIUM 9.4 02/16/2015   PROT 7.6 02/16/2015   ALBUMIN 4.6 02/16/2015   AST 29 02/16/2015   ALT 30 02/16/2015   ALKPHOS 110 02/16/2015   BILITOT 0.4 02/16/2015   GFRNONAA >60 02/16/2015   GFRAA >60 02/16/2015    Lab Results  Component Value Date   WBC 7.5  02/16/2015   NEUTROABS 5.8 02/16/2015   HGB 14.7 02/16/2015   HCT 42.4 02/16/2015   MCV 93.7 02/16/2015   PLT 83* 02/16/2015     STUDIES: Abdomen 1 View (kub)  02/17/2015   CLINICAL DATA:  Report of prior renal calculi on outside study three days prior. History of frequency with urination and dysuria  EXAM: ABDOMEN - 1 VIEW  COMPARISON:  CT abdomen and pelvis October 16, 2012; images and/or report of outside recent CT currently not available  FINDINGS: There are apparent phleboliths in the pelvis. No other abnormal calcifications are identified. There is moderate stool in the colon. The bowel gas pattern is normal. No obstruction or free air.  IMPRESSION: Normal gas pattern. Calcifications in the left pelvis are consistent with phleboliths. No other abnormal calcifications are identified.   Electronically Signed   By: Lowella Grip III M.D.   On: 02/17/2015 17:06    ASSESSMENT: Autoimmune thrombocytopenia.  PLAN:    1. Thrombocytopenia: Patient's platelet antibodies are positive.  She also had a positive ANA, which was evaluated by rheumatology and determined to be clinically insignificant.  Patient completed 4 cycles of weekly Rituxan on Jan 17, 2014. The remainder of her laboratory work was either negative or within normal limits. She does not have splenomegaly. Her platelets continue to be decreased, but approximately her baseline. No intervention is needed at this time. Return to clinic in 3 months for laboratory work only and then in 6 months for laboratory work and further evaluation. Patient understands she can return to clinic at any time if she has any questions, concerns, or complaints. 2. Knee replacement: Completed. Further treatment per orthopedics.    Patient expressed understanding and was in agreement with this plan. She also understands that She can call clinic at any time with any questions, concerns, or complaints.   No matching staging information was found for the  patient.  Lloyd Huger, MD   02/21/2015 11:50 AM

## 2015-02-25 ENCOUNTER — Ambulatory Visit
Admission: RE | Admit: 2015-02-25 | Discharge: 2015-02-25 | Disposition: A | Discharge status: Home / Self Care | Payer: Medicaid Other | Source: Ambulatory Visit | Attending: Urology | Admitting: Urology

## 2015-02-25 DIAGNOSIS — N200 Calculus of kidney: Secondary | ICD-10-CM | POA: Diagnosis not present

## 2015-02-25 DIAGNOSIS — N1330 Unspecified hydronephrosis: Secondary | ICD-10-CM

## 2015-02-26 ENCOUNTER — Telehealth: Payer: Self-pay

## 2015-02-26 ENCOUNTER — Telehealth: Payer: Self-pay | Admitting: Urology

## 2015-02-26 DIAGNOSIS — N200 Calculus of kidney: Secondary | ICD-10-CM

## 2015-02-26 NOTE — Telephone Encounter (Signed)
-----   Message from Nori Riis, PA-C sent at 02/25/2015  3:53 PM EDT ----- RUS is negative for hydronephrosis.  Is she still having pain?

## 2015-02-26 NOTE — Telephone Encounter (Signed)
Spoke with pt who stated she is still having pain 7/10 pain scale. Cw,lpn

## 2015-02-26 NOTE — Telephone Encounter (Signed)
Pt called wanting to know if she could obtained a refill of her medication from having a kidney stone removed. Best contact [phone] 02/26/15 MAF

## 2015-02-26 NOTE — Telephone Encounter (Signed)
Pt called back and said she had talked to someone about the results, but nobody said what they are going to do about the kidney stone.  02/26/15 MAF

## 2015-02-27 NOTE — Telephone Encounter (Signed)
Patient's order is in the referral workque.

## 2015-03-06 NOTE — Telephone Encounter (Signed)
Pt called stating she is still in pain 7/10. Pt orginally saw Dr. Tresa Moore and had a KUB, stone not visible. Larene Beach ordered a renal u/s. A message was sent to [facility], she has not replied. Pt is wanting to have stone taken care of. Please advise. Cw,lpn

## 2015-03-08 NOTE — Telephone Encounter (Signed)
There is no evidence of residual blocking stone on KUB or evidence of swelling of the kidney on RUS.  In all likelihood, the stone has passed.  The only definitive test would be to repeat a low-dose CT scan.  If she is still having acute  Monday (if she is still having 7//10 pain), then please arrange for STAT noncontrast CT abd/ pelvis.  Hollice Espy, MD

## 2015-03-09 ENCOUNTER — Telehealth: Payer: Self-pay

## 2015-03-09 ENCOUNTER — Ambulatory Visit
Admission: RE | Admit: 2015-03-09 | Discharge: 2015-03-09 | Disposition: A | Discharge status: Home / Self Care | Payer: Medicaid Other | Source: Ambulatory Visit | Attending: Urology | Admitting: Urology

## 2015-03-09 DIAGNOSIS — K7460 Unspecified cirrhosis of liver: Secondary | ICD-10-CM | POA: Diagnosis not present

## 2015-03-09 DIAGNOSIS — N200 Calculus of kidney: Secondary | ICD-10-CM

## 2015-03-09 NOTE — Telephone Encounter (Signed)
Marque from imaging dept called with CT report- no findings to explain left flank pain or hematuria.

## 2015-03-09 NOTE — Telephone Encounter (Signed)
Spoke with pt who states pain is 6/10. Per Dr. Erlene Quan stat CT noncontrast abd/pelvis has been ordered.

## 2015-03-10 NOTE — Telephone Encounter (Signed)
-----   Message from Hollice Espy, MD sent at 03/09/2015  7:35 PM EDT ----- Results reviewed.  Patient made aware of findings.  No evidence of residual stone.  Pain unlikely to be GU related.    Hollice Espy, MD

## 2015-04-23 ENCOUNTER — Inpatient Hospital Stay: Payer: Medicaid Other | Attending: Oncology

## 2015-04-29 ENCOUNTER — Inpatient Hospital Stay: Payer: Medicaid Other | Attending: Oncology

## 2015-04-29 DIAGNOSIS — D696 Thrombocytopenia, unspecified: Secondary | ICD-10-CM | POA: Diagnosis not present

## 2015-04-29 LAB — CBC WITH DIFFERENTIAL/PLATELET
Basophils Absolute: 0 10*3/uL (ref 0–0.1)
Basophils Relative: 0 %
Eosinophils Absolute: 0.1 10*3/uL (ref 0–0.7)
Eosinophils Relative: 2 %
HEMATOCRIT: 44.9 % (ref 35.0–47.0)
HEMOGLOBIN: 15.4 g/dL (ref 12.0–16.0)
Lymphocytes Relative: 27 %
Lymphs Abs: 1.9 10*3/uL (ref 1.0–3.6)
MCH: 31.8 pg (ref 26.0–34.0)
MCHC: 34.4 g/dL (ref 32.0–36.0)
MCV: 92.3 fL (ref 80.0–100.0)
MONO ABS: 0.6 10*3/uL (ref 0.2–0.9)
MONOS PCT: 9 %
NEUTROS ABS: 4.3 10*3/uL (ref 1.4–6.5)
NEUTROS PCT: 62 %
Platelets: 102 10*3/uL — ABNORMAL LOW (ref 150–440)
RBC: 4.86 MIL/uL (ref 3.80–5.20)
RDW: 12.6 % (ref 11.5–14.5)
WBC: 7 10*3/uL (ref 3.6–11.0)

## 2015-06-25 ENCOUNTER — Other Ambulatory Visit: Payer: Self-pay | Admitting: Gastroenterology

## 2015-06-25 DIAGNOSIS — N200 Calculus of kidney: Secondary | ICD-10-CM

## 2015-06-25 DIAGNOSIS — R109 Unspecified abdominal pain: Secondary | ICD-10-CM

## 2015-06-25 DIAGNOSIS — K7460 Unspecified cirrhosis of liver: Secondary | ICD-10-CM

## 2015-06-25 DIAGNOSIS — Z87448 Personal history of other diseases of urinary system: Secondary | ICD-10-CM

## 2015-07-02 ENCOUNTER — Ambulatory Visit
Admission: RE | Admit: 2015-07-02 | Discharge: 2015-07-02 | Disposition: A | Discharge status: Home / Self Care | Payer: Medicaid Other | Source: Ambulatory Visit | Attending: Gastroenterology | Admitting: Gastroenterology

## 2015-07-02 DIAGNOSIS — K7460 Unspecified cirrhosis of liver: Secondary | ICD-10-CM | POA: Insufficient documentation

## 2015-07-02 DIAGNOSIS — R109 Unspecified abdominal pain: Secondary | ICD-10-CM

## 2015-07-02 DIAGNOSIS — Z87448 Personal history of other diseases of urinary system: Secondary | ICD-10-CM | POA: Diagnosis present

## 2015-07-02 DIAGNOSIS — N200 Calculus of kidney: Secondary | ICD-10-CM

## 2015-07-02 DIAGNOSIS — R100 Acute abdomen: Secondary | ICD-10-CM | POA: Insufficient documentation

## 2015-07-23 ENCOUNTER — Inpatient Hospital Stay: Payer: Medicaid Other | Admitting: Oncology

## 2015-07-23 ENCOUNTER — Inpatient Hospital Stay: Payer: Medicaid Other

## 2015-08-31 ENCOUNTER — Inpatient Hospital Stay: Payer: Medicaid Other | Admitting: Oncology

## 2015-08-31 ENCOUNTER — Inpatient Hospital Stay: Payer: Medicaid Other | Attending: Oncology

## 2015-08-31 DIAGNOSIS — Z79899 Other long term (current) drug therapy: Secondary | ICD-10-CM | POA: Insufficient documentation

## 2015-08-31 DIAGNOSIS — D693 Immune thrombocytopenic purpura: Secondary | ICD-10-CM | POA: Insufficient documentation

## 2015-08-31 DIAGNOSIS — F17210 Nicotine dependence, cigarettes, uncomplicated: Secondary | ICD-10-CM | POA: Insufficient documentation

## 2015-08-31 DIAGNOSIS — Z96659 Presence of unspecified artificial knee joint: Secondary | ICD-10-CM | POA: Insufficient documentation

## 2015-08-31 DIAGNOSIS — Z8619 Personal history of other infectious and parasitic diseases: Secondary | ICD-10-CM | POA: Insufficient documentation

## 2015-08-31 DIAGNOSIS — Z87442 Personal history of urinary calculi: Secondary | ICD-10-CM | POA: Insufficient documentation

## 2015-09-17 ENCOUNTER — Inpatient Hospital Stay (HOSPITAL_BASED_OUTPATIENT_CLINIC_OR_DEPARTMENT_OTHER): Payer: Medicaid Other | Admitting: Oncology

## 2015-09-17 ENCOUNTER — Inpatient Hospital Stay: Payer: Medicaid Other

## 2015-09-17 VITALS — BP 176/94 | Temp 97.1°F | Wt 165.3 lb

## 2015-09-17 DIAGNOSIS — Z8619 Personal history of other infectious and parasitic diseases: Secondary | ICD-10-CM

## 2015-09-17 DIAGNOSIS — D693 Immune thrombocytopenic purpura: Secondary | ICD-10-CM | POA: Diagnosis not present

## 2015-09-17 DIAGNOSIS — Z87442 Personal history of urinary calculi: Secondary | ICD-10-CM | POA: Diagnosis not present

## 2015-09-17 DIAGNOSIS — Z79899 Other long term (current) drug therapy: Secondary | ICD-10-CM

## 2015-09-17 DIAGNOSIS — Z96659 Presence of unspecified artificial knee joint: Secondary | ICD-10-CM

## 2015-09-17 DIAGNOSIS — F17210 Nicotine dependence, cigarettes, uncomplicated: Secondary | ICD-10-CM

## 2015-09-17 DIAGNOSIS — D696 Thrombocytopenia, unspecified: Secondary | ICD-10-CM

## 2015-09-17 LAB — CBC WITH DIFFERENTIAL/PLATELET
Basophils Absolute: 0 10*3/uL (ref 0–0.1)
Basophils Relative: 0 %
EOS ABS: 0.1 10*3/uL (ref 0–0.7)
Eosinophils Relative: 1 %
HCT: 42.2 % (ref 35.0–47.0)
HEMOGLOBIN: 14.8 g/dL (ref 12.0–16.0)
Lymphocytes Relative: 23 %
Lymphs Abs: 1.4 10*3/uL (ref 1.0–3.6)
MCH: 32.6 pg (ref 26.0–34.0)
MCHC: 35.1 g/dL (ref 32.0–36.0)
MCV: 92.8 fL (ref 80.0–100.0)
MONOS PCT: 8 %
Monocytes Absolute: 0.5 10*3/uL (ref 0.2–0.9)
NEUTROS PCT: 68 %
Neutro Abs: 4.3 10*3/uL (ref 1.4–6.5)
Platelets: 98 10*3/uL — ABNORMAL LOW (ref 150–440)
RBC: 4.55 MIL/uL (ref 3.80–5.20)
RDW: 12.7 % (ref 11.5–14.5)
WBC: 6.3 10*3/uL (ref 3.6–11.0)

## 2015-09-20 NOTE — Progress Notes (Signed)
[place]  [phone]) [phone] Fax:(336) [phone]  ID: Criss Alvine OB: 1955/12/30  MR#: [id number]  [id number]  Patient Care Team: Lavera Guise, MD as PCP - General (Internal Medicine)  CHIEF COMPLAINT:  Chief Complaint  Patient presents with  . thrombocytopenia    INTERVAL HISTORY: Patient returns to clinic today for laboratory work and further evaluation. She continues to feel well and is asymptomatic. She denies any easy bleeding or bruising. She has no neurologic complaints. She denies any recent fevers or illnesses. She has a good appetite and denies weight loss. She has no chest pain or shortness of breath. She denies any nausea, vomiting, constipation, or diarrhea. She denies melena or hematochezia.  She has no urinary complaints. Patient offers no specific complaints today.  REVIEW OF SYSTEMS:   Review of Systems  Constitutional: Negative.   Respiratory: Negative.  Negative for sputum production.   Cardiovascular: Negative.  Negative for chest pain.  Gastrointestinal: Negative.  Negative for abdominal pain, blood in stool and melena.  Musculoskeletal: Negative.   Neurological: Negative.   Endo/Heme/Allergies: Does not bruise/bleed easily.    As per HPI. Otherwise, a complete review of systems is negatve.  PAST MEDICAL HISTORY: Past Medical History  Diagnosis Date  . Hepatitis C   . Kidney stone   . Sepsis due to urinary tract infection 2014    PAST SURGICAL HISTORY: Past Surgical History  Procedure Laterality Date  . Tonsillectomy    . Total knee arthroplasty      FAMILY HISTORY Family History  Problem Relation Age of Onset  . Liver cancer Father        ADVANCED DIRECTIVES:    HEALTH MAINTENANCE: Social History  Substance Use Topics  . Smoking status: Current Some Day Smoker    Types: Cigarettes  . Smokeless tobacco: Never Used  . Alcohol Use: Yes     Comment: occasionally     Colonoscopy:  PAP:  Bone  density:  Lipid panel:  Allergies  Allergen Reactions  . Aspirin Nausea And Vomiting  . Nsaids Other (See Comments)    Liver function/plateles    Current Outpatient Prescriptions  Medication Sig Dispense Refill  . Biotin 1 MG CAPS Take by mouth.    . sulfamethoxazole-trimethoprim (BACTRIM DS,SEPTRA DS) 800-160 MG per tablet Take 1 tablet by mouth 2 (two) times daily. 14 tablet 0  . tamsulosin (FLOMAX) 0.4 MG CAPS capsule Take 1 capsule (0.4 mg total) by mouth daily. 30 capsule 0  . traMADol (ULTRAM) 50 MG tablet 1 tablet by mouth every 4 to 6 hours as needed for pain    . ketorolac (TORADOL) 10 MG tablet Take 10 mg by mouth every 6 (six) hours as needed. Reported on 09/17/2015    . ondansetron (ZOFRAN) 4 MG tablet Take 1 tablet (4 mg total) by mouth every 8 (eight) hours as needed for nausea or vomiting. (Patient not taking: Reported on 09/17/2015) 10 tablet 1  . oxyCODONE-acetaminophen (PERCOCET/ROXICET) 5-325 MG per tablet Take 1-2 tablets by mouth every 4 (four) hours as needed for severe pain. (Patient not taking: Reported on 09/17/2015) 30 tablet 0   No current facility-administered medications for this visit.    OBJECTIVE: Filed Vitals:   09/17/15 1509  BP: 176/94  Temp: 97.1 F (36.2 C)     There is no height on file to calculate BMI.    ECOG FS:0 - Asymptomatic  General: Well-developed, well-nourished, no acute distress. Eyes: anicteric sclera. Lungs: Clear to auscultation bilaterally.  Heart: Regular rate and rhythm. No rubs, murmurs, or gallops. Abdomen: Soft, nontender, nondistended. No organomegaly noted, normoactive bowel sounds. Musculoskeletal: No edema, cyanosis, or clubbing. Neuro: Alert, answering all questions appropriately. Cranial nerves grossly intact. Skin: No rashes or petechiae noted. Psych: Normal affect.   LAB RESULTS:  Lab Results  Component Value Date   NA 146* 02/16/2015   K 4.1 02/16/2015   CL 111 02/16/2015   CO2 27 02/16/2015   GLUCOSE  117* 02/16/2015   BUN 16 02/16/2015   CREATININE 0.68 02/16/2015   CALCIUM 9.4 02/16/2015   PROT 7.6 02/16/2015   ALBUMIN 4.6 02/16/2015   AST 29 02/16/2015   ALT 30 02/16/2015   ALKPHOS 110 02/16/2015   BILITOT 0.4 02/16/2015   GFRNONAA >60 02/16/2015   GFRAA >60 02/16/2015    Lab Results  Component Value Date   WBC 6.3 09/17/2015   NEUTROABS 4.3 09/17/2015   HGB 14.8 09/17/2015   HCT 42.2 09/17/2015   MCV 92.8 09/17/2015   PLT 98* 09/17/2015     STUDIES: No results found.  ASSESSMENT: Autoimmune thrombocytopenia.  PLAN:    1. Thrombocytopenia: Patient's platelet antibodies are positive.  She also had a positive ANA, which was evaluated by rheumatology and determined to be clinically insignificant.  Patient completed 4 cycles of weekly Rituxan on Jan 17, 2014. The remainder of her laboratory work was either negative or within normal limits. She does not have splenomegaly. Her platelets continue to be decreased, but approximately her baseline. No intervention is needed at this time. Return to clinic in 6 months for laboratory work and further evaluation. Patient understands she can return to clinic at any time if she has any questions, concerns, or complaints. 2. Knee replacement: Completed. Further treatment per orthopedics.    Patient expressed understanding and was in agreement with this plan. She also understands that She can call clinic at any time with any questions, concerns, or complaints.    Lloyd Huger, MD   09/20/2015 7:46 AM

## 2016-01-07 ENCOUNTER — Other Ambulatory Visit (HOSPITAL_COMMUNITY): Payer: Self-pay | Admitting: Gastroenterology

## 2016-01-07 DIAGNOSIS — K7460 Unspecified cirrhosis of liver: Secondary | ICD-10-CM

## 2016-01-13 ENCOUNTER — Ambulatory Visit: Payer: Medicaid Other

## 2016-01-22 ENCOUNTER — Ambulatory Visit
Admission: RE | Admit: 2016-01-22 | Discharge: 2016-01-22 | Disposition: A | Discharge status: Home / Self Care | Payer: Medicaid Other | Source: Ambulatory Visit | Attending: Gastroenterology | Admitting: Gastroenterology

## 2016-01-22 DIAGNOSIS — K7460 Unspecified cirrhosis of liver: Secondary | ICD-10-CM | POA: Insufficient documentation

## 2016-03-10 DIAGNOSIS — N390 Urinary tract infection, site not specified: Secondary | ICD-10-CM | POA: Diagnosis not present

## 2016-03-10 DIAGNOSIS — Z124 Encounter for screening for malignant neoplasm of cervix: Secondary | ICD-10-CM | POA: Diagnosis not present

## 2016-03-10 DIAGNOSIS — Z0001 Encounter for general adult medical examination with abnormal findings: Secondary | ICD-10-CM | POA: Diagnosis not present

## 2016-03-10 DIAGNOSIS — R1310 Dysphagia, unspecified: Secondary | ICD-10-CM | POA: Diagnosis not present

## 2016-03-10 DIAGNOSIS — F17210 Nicotine dependence, cigarettes, uncomplicated: Secondary | ICD-10-CM | POA: Diagnosis not present

## 2016-03-11 ENCOUNTER — Other Ambulatory Visit: Payer: Self-pay | Admitting: Nurse Practitioner

## 2016-03-11 DIAGNOSIS — IMO0001 Reserved for inherently not codable concepts without codable children: Secondary | ICD-10-CM

## 2016-03-11 DIAGNOSIS — R1310 Dysphagia, unspecified: Secondary | ICD-10-CM

## 2016-03-11 DIAGNOSIS — K219 Gastro-esophageal reflux disease without esophagitis: Principal | ICD-10-CM

## 2016-03-16 DIAGNOSIS — E559 Vitamin D deficiency, unspecified: Secondary | ICD-10-CM | POA: Diagnosis not present

## 2016-03-16 DIAGNOSIS — Z0001 Encounter for general adult medical examination with abnormal findings: Secondary | ICD-10-CM | POA: Diagnosis not present

## 2016-03-16 DIAGNOSIS — E782 Mixed hyperlipidemia: Secondary | ICD-10-CM | POA: Diagnosis not present

## 2016-03-17 ENCOUNTER — Inpatient Hospital Stay: Payer: Commercial Managed Care - HMO | Attending: Oncology

## 2016-03-17 ENCOUNTER — Inpatient Hospital Stay (HOSPITAL_BASED_OUTPATIENT_CLINIC_OR_DEPARTMENT_OTHER): Payer: Commercial Managed Care - HMO | Admitting: Oncology

## 2016-03-17 VITALS — BP 148/81 | HR 89 | Temp 97.5°F | Resp 18 | Wt 166.8 lb

## 2016-03-17 DIAGNOSIS — D696 Thrombocytopenia, unspecified: Secondary | ICD-10-CM

## 2016-03-17 DIAGNOSIS — Z87442 Personal history of urinary calculi: Secondary | ICD-10-CM

## 2016-03-17 DIAGNOSIS — Z87440 Personal history of urinary (tract) infections: Secondary | ICD-10-CM | POA: Insufficient documentation

## 2016-03-17 DIAGNOSIS — F17210 Nicotine dependence, cigarettes, uncomplicated: Secondary | ICD-10-CM | POA: Diagnosis not present

## 2016-03-17 DIAGNOSIS — D693 Immune thrombocytopenic purpura: Secondary | ICD-10-CM | POA: Insufficient documentation

## 2016-03-17 DIAGNOSIS — Z96659 Presence of unspecified artificial knee joint: Secondary | ICD-10-CM | POA: Diagnosis not present

## 2016-03-17 DIAGNOSIS — I10 Essential (primary) hypertension: Secondary | ICD-10-CM

## 2016-03-17 DIAGNOSIS — Z8619 Personal history of other infectious and parasitic diseases: Secondary | ICD-10-CM

## 2016-03-17 DIAGNOSIS — Z79899 Other long term (current) drug therapy: Secondary | ICD-10-CM

## 2016-03-17 LAB — CBC WITH DIFFERENTIAL/PLATELET
BASOS ABS: 0 10*3/uL (ref 0–0.1)
Basophils Relative: 0 %
EOS ABS: 0.1 10*3/uL (ref 0–0.7)
HCT: 40.6 % (ref 35.0–47.0)
Hemoglobin: 14.1 g/dL (ref 12.0–16.0)
Lymphocytes Relative: 36 %
Lymphs Abs: 2.4 10*3/uL (ref 1.0–3.6)
MCH: 32.3 pg (ref 26.0–34.0)
MCHC: 34.8 g/dL (ref 32.0–36.0)
MCV: 93 fL (ref 80.0–100.0)
Monocytes Absolute: 0.5 10*3/uL (ref 0.2–0.9)
Monocytes Relative: 7 %
Neutro Abs: 3.6 10*3/uL (ref 1.4–6.5)
Neutrophils Relative %: 55 %
PLATELETS: 98 10*3/uL — AB (ref 150–440)
RBC: 4.36 MIL/uL (ref 3.80–5.20)
RDW: 13 % (ref 11.5–14.5)
WBC: 6.6 10*3/uL (ref 3.6–11.0)

## 2016-03-17 NOTE — Progress Notes (Signed)
States is feeling well. Offers no complaints. 

## 2016-03-20 NOTE — Progress Notes (Signed)
[place]  [phone]) [phone] Fax:(336) [phone]  ID: Angie [name] OB: 06-19-1956  MR#: [id number]  [id number]  Patient Care Team: Lavera Guise, MD as PCP - General (Internal Medicine)  CHIEF COMPLAINT: Autoimmune thrombocytopenia.  INTERVAL HISTORY: Patient returns to clinic today for laboratory work and further evaluation. She continues to feel well and is asymptomatic. She denies any easy bleeding or bruising. She has no neurologic complaints. She denies any recent fevers or illnesses. She has a good appetite and denies weight loss. She has no chest pain or shortness of breath. She denies any nausea, vomiting, constipation, or diarrhea. She denies melena or hematochezia.  She has no urinary complaints. Patient offers no specific complaints today.  REVIEW OF SYSTEMS:   Review of Systems  Constitutional: Negative.   Respiratory: Negative.  Negative for sputum production.   Cardiovascular: Negative.  Negative for chest pain.  Gastrointestinal: Negative.  Negative for abdominal pain, blood in stool and melena.  Musculoskeletal: Negative.   Neurological: Negative.   Endo/Heme/Allergies: Does not bruise/bleed easily.    As per HPI. Otherwise, a complete review of systems is negatve.  PAST MEDICAL HISTORY: Past Medical History:  Diagnosis Date  . Hepatitis C   . Kidney stone   . Sepsis due to urinary tract infection 2014    PAST SURGICAL HISTORY: Past Surgical History:  Procedure Laterality Date  . TONSILLECTOMY    . TOTAL KNEE ARTHROPLASTY      FAMILY HISTORY Family History  Problem Relation Age of Onset  . Liver cancer Father        ADVANCED DIRECTIVES:    HEALTH MAINTENANCE: Social History  Substance Use Topics  . Smoking status: Current Some Day Smoker    Types: Cigarettes  . Smokeless tobacco: Never Used  . Alcohol use Yes     Comment: occasionally     Colonoscopy:  PAP:  Bone density:  Lipid panel:  Allergies    Allergen Reactions  . Aspirin Nausea And Vomiting  . Nsaids Other (See Comments)    Liver function/plateles    Current Outpatient Prescriptions  Medication Sig Dispense Refill  . Biotin 1 MG CAPS Take by mouth.    . ciprofloxacin (CIPRO) 500 MG tablet Take 500 mg by mouth 2 (two) times daily.    . ondansetron (ZOFRAN) 4 MG tablet Take 1 tablet (4 mg total) by mouth every 8 (eight) hours as needed for nausea or vomiting. 10 tablet 1  . tamsulosin (FLOMAX) 0.4 MG CAPS capsule Take 1 capsule (0.4 mg total) by mouth daily. 30 capsule 0  . traMADol (ULTRAM) 50 MG tablet 1 tablet by mouth every 4 to 6 hours as needed for pain     No current facility-administered medications for this visit.     OBJECTIVE: Vitals:   03/17/16 1454  BP: (!) 148/81  Pulse: 89  Resp: 18  Temp: 97.5 F (36.4 C)     There is no height or weight on file to calculate BMI.    ECOG FS:0 - Asymptomatic  General: Well-developed, well-nourished, no acute distress. Eyes: anicteric sclera. Lungs: Clear to auscultation bilaterally. Heart: Regular rate and rhythm. No rubs, murmurs, or gallops. Abdomen: Soft, nontender, nondistended. No organomegaly noted, normoactive bowel sounds. Musculoskeletal: No edema, cyanosis, or clubbing. Neuro: Alert, answering all questions appropriately. Cranial nerves grossly intact. Skin: No rashes or petechiae noted. Psych: Normal affect.   LAB RESULTS:  Lab Results  Component Value Date   NA 146 (H) 02/16/2015  K 4.1 02/16/2015   CL 111 02/16/2015   CO2 27 02/16/2015   GLUCOSE 117 (H) 02/16/2015   BUN 16 02/16/2015   CREATININE 0.68 02/16/2015   CALCIUM 9.4 02/16/2015   PROT 7.6 02/16/2015   ALBUMIN 4.6 02/16/2015   AST 29 02/16/2015   ALT 30 02/16/2015   ALKPHOS 110 02/16/2015   BILITOT 0.4 02/16/2015   GFRNONAA >60 02/16/2015   GFRAA >60 02/16/2015    Lab Results  Component Value Date   WBC 6.6 03/17/2016   NEUTROABS 3.6 03/17/2016   HGB 14.1 03/17/2016    HCT 40.6 03/17/2016   MCV 93.0 03/17/2016   PLT 98 (L) 03/17/2016     STUDIES: No results found.  ASSESSMENT: Autoimmune thrombocytopenia.  PLAN:    1. Autoimmune thrombocytopenia: Patient's platelet antibodies are positive.  She also had a positive ANA, which was evaluated by rheumatology and determined to be clinically insignificant.  Patient completed 4 cycles of weekly Rituxan on Jan 17, 2014. The remainder of her laboratory work was either negative or within normal limits. She does not have splenomegaly. Her platelets continue to be decreased, but approximately her baseline. No intervention is needed at this time. Return to clinic in 6 months for laboratory work only and then in one year for laboratory work and further evaluation. Patient understands she can return to clinic at any time if she has any questions, concerns, or complaints. 2. Knee replacement: Completed. Further treatment per orthopedics.  3. Hypertension: Patient's blood pressure mildly elevated today. Continue evaluation and treatment per primary care.  Patient expressed understanding and was in agreement with this plan. She also understands that She can call clinic at any time with any questions, concerns, or complaints.    Lloyd Huger, MD   03/20/2016 9:13 AM

## 2016-03-21 ENCOUNTER — Ambulatory Visit
Admission: RE | Admit: 2016-03-21 | Discharge: 2016-03-21 | Disposition: A | Discharge status: Home / Self Care | Payer: Commercial Managed Care - HMO | Source: Ambulatory Visit | Attending: Nurse Practitioner | Admitting: Nurse Practitioner

## 2016-03-21 ENCOUNTER — Ambulatory Visit: Admission: RE | Admit: 2016-03-21 | Payer: Commercial Managed Care - HMO | Source: Ambulatory Visit

## 2016-03-21 DIAGNOSIS — IMO0001 Reserved for inherently not codable concepts without codable children: Secondary | ICD-10-CM

## 2016-03-21 DIAGNOSIS — K219 Gastro-esophageal reflux disease without esophagitis: Secondary | ICD-10-CM | POA: Insufficient documentation

## 2016-03-21 DIAGNOSIS — I69891 Dysphagia following other cerebrovascular disease: Secondary | ICD-10-CM | POA: Diagnosis not present

## 2016-03-21 DIAGNOSIS — R933 Abnormal findings on diagnostic imaging of other parts of digestive tract: Secondary | ICD-10-CM | POA: Insufficient documentation

## 2016-03-21 DIAGNOSIS — R1310 Dysphagia, unspecified: Secondary | ICD-10-CM | POA: Diagnosis not present

## 2016-05-03 DIAGNOSIS — K7460 Unspecified cirrhosis of liver: Secondary | ICD-10-CM | POA: Diagnosis not present

## 2016-05-03 DIAGNOSIS — R1013 Epigastric pain: Secondary | ICD-10-CM | POA: Diagnosis not present

## 2016-05-03 DIAGNOSIS — R933 Abnormal findings on diagnostic imaging of other parts of digestive tract: Secondary | ICD-10-CM | POA: Diagnosis not present

## 2016-05-13 ENCOUNTER — Ambulatory Visit: Payer: Commercial Managed Care - HMO | Admitting: Anesthesiology

## 2016-05-13 ENCOUNTER — Encounter
Admission: RE | Disposition: A | Discharge status: Home / Self Care | Payer: Self-pay | Source: Ambulatory Visit | Attending: Gastroenterology

## 2016-05-13 ENCOUNTER — Ambulatory Visit
Admission: RE | Admit: 2016-05-13 | Discharge: 2016-05-13 | Disposition: A | Discharge status: Home / Self Care | Payer: Commercial Managed Care - HMO | Source: Ambulatory Visit | Attending: Gastroenterology | Admitting: Gastroenterology

## 2016-05-13 DIAGNOSIS — K2900 Acute gastritis without bleeding: Secondary | ICD-10-CM | POA: Diagnosis not present

## 2016-05-13 DIAGNOSIS — Z87891 Personal history of nicotine dependence: Secondary | ICD-10-CM | POA: Insufficient documentation

## 2016-05-13 DIAGNOSIS — Z8619 Personal history of other infectious and parasitic diseases: Secondary | ICD-10-CM | POA: Diagnosis not present

## 2016-05-13 DIAGNOSIS — K210 Gastro-esophageal reflux disease with esophagitis: Secondary | ICD-10-CM | POA: Insufficient documentation

## 2016-05-13 DIAGNOSIS — R1013 Epigastric pain: Secondary | ICD-10-CM | POA: Diagnosis present

## 2016-05-13 DIAGNOSIS — M1990 Unspecified osteoarthritis, unspecified site: Secondary | ICD-10-CM | POA: Diagnosis not present

## 2016-05-13 DIAGNOSIS — K2960 Other gastritis without bleeding: Secondary | ICD-10-CM | POA: Insufficient documentation

## 2016-05-13 DIAGNOSIS — K3189 Other diseases of stomach and duodenum: Secondary | ICD-10-CM | POA: Insufficient documentation

## 2016-05-13 DIAGNOSIS — B9681 Helicobacter pylori [H. pylori] as the cause of diseases classified elsewhere: Secondary | ICD-10-CM | POA: Diagnosis not present

## 2016-05-13 DIAGNOSIS — Z79899 Other long term (current) drug therapy: Secondary | ICD-10-CM | POA: Insufficient documentation

## 2016-05-13 DIAGNOSIS — K7460 Unspecified cirrhosis of liver: Secondary | ICD-10-CM | POA: Diagnosis not present

## 2016-05-13 DIAGNOSIS — Z886 Allergy status to analgesic agent status: Secondary | ICD-10-CM | POA: Diagnosis not present

## 2016-05-13 DIAGNOSIS — Z888 Allergy status to other drugs, medicaments and biological substances status: Secondary | ICD-10-CM | POA: Insufficient documentation

## 2016-05-13 DIAGNOSIS — K319 Disease of stomach and duodenum, unspecified: Secondary | ICD-10-CM | POA: Diagnosis not present

## 2016-05-13 DIAGNOSIS — K766 Portal hypertension: Secondary | ICD-10-CM | POA: Insufficient documentation

## 2016-05-13 DIAGNOSIS — Z87442 Personal history of urinary calculi: Secondary | ICD-10-CM | POA: Diagnosis not present

## 2016-05-13 HISTORY — DX: Unspecified osteoarthritis, unspecified site: M19.90

## 2016-05-13 HISTORY — PX: ESOPHAGOGASTRODUODENOSCOPY (EGD) WITH PROPOFOL: SHX5813

## 2016-05-13 LAB — CBC WITH DIFFERENTIAL/PLATELET
Basophils Absolute: 0 10*3/uL (ref 0–0.1)
Basophils Relative: 1 %
Eosinophils Absolute: 0.2 10*3/uL (ref 0–0.7)
Eosinophils Relative: 3 %
HEMATOCRIT: 44.7 % (ref 35.0–47.0)
Hemoglobin: 15.4 g/dL (ref 12.0–16.0)
LYMPHS ABS: 2.7 10*3/uL (ref 1.0–3.6)
LYMPHS PCT: 41 %
MCH: 32.1 pg (ref 26.0–34.0)
MCHC: 34.4 g/dL (ref 32.0–36.0)
MCV: 93.4 fL (ref 80.0–100.0)
MONO ABS: 0.5 10*3/uL (ref 0.2–0.9)
MONOS PCT: 8 %
NEUTROS ABS: 3.1 10*3/uL (ref 1.4–6.5)
Neutrophils Relative %: 47 %
PLATELETS: 118 10*3/uL — AB (ref 150–440)
RBC: 4.78 MIL/uL (ref 3.80–5.20)
RDW: 12.6 % (ref 11.5–14.5)
WBC: 6.5 10*3/uL (ref 3.6–11.0)

## 2016-05-13 LAB — PROTIME-INR
INR: 1.03
Prothrombin Time: 13.5 seconds (ref 11.4–15.2)

## 2016-05-13 SURGERY — ESOPHAGOGASTRODUODENOSCOPY (EGD) WITH PROPOFOL
Anesthesia: General

## 2016-05-13 MED ORDER — SODIUM CHLORIDE 0.9 % IV SOLN
2.0000 g | INTRAVENOUS | Status: AC
  Administered 2016-05-13: 2 g via INTRAVENOUS
  Filled 2016-05-13: qty 2000

## 2016-05-13 MED ORDER — SODIUM CHLORIDE 0.9 % IV SOLN
INTRAVENOUS | Status: DC
  Administered 2016-05-13: 14:00:00 via INTRAVENOUS

## 2016-05-13 MED ORDER — PROPOFOL 500 MG/50ML IV EMUL
INTRAVENOUS | Status: DC | PRN
  Administered 2016-05-13: 160 ug/kg/min via INTRAVENOUS

## 2016-05-13 MED ORDER — PROPOFOL 10 MG/ML IV BOLUS
INTRAVENOUS | Status: DC | PRN
  Administered 2016-05-13: 30 mg via INTRAVENOUS

## 2016-05-13 MED ORDER — MIDAZOLAM HCL 5 MG/5ML IJ SOLN
INTRAMUSCULAR | Status: DC | PRN
  Administered 2016-05-13: 1 mg via INTRAVENOUS

## 2016-05-13 MED ORDER — FENTANYL CITRATE (PF) 100 MCG/2ML IJ SOLN
INTRAMUSCULAR | Status: DC | PRN
  Administered 2016-05-13: 25 ug via INTRAVENOUS
  Administered 2016-05-13: 50 ug via INTRAVENOUS
  Administered 2016-05-13: 25 ug via INTRAVENOUS

## 2016-05-13 MED ORDER — SODIUM CHLORIDE 0.9 % IV SOLN: INTRAVENOUS | Status: DC

## 2016-05-13 NOTE — Op Note (Signed)
[facility] Patient Name: Angie [name] Procedure Date: 05/13/2016 2:02 PM MRN: [id number] Account #: [id number] Date of Birth: 10/01/1955 Admit Type: Outpatient Age: 60 Room: [facility] ENDO ROOM 3 Gender: Female Note Status: Finalized Procedure:            Upper GI endoscopy Indications:          Dyspepsia, Abnormal UGI series Providers:            Lollie Sails, MD Referring MD:         Lavera Guise, MD (Referring MD) Medicines:            Monitored Anesthesia Care Complications:        No immediate complications. Procedure:            Pre-Anesthesia Assessment:                       - ASA Grade Assessment: III - A patient with severe                        systemic disease.                       After obtaining informed consent, the endoscope was                        passed under direct vision. Throughout the procedure,                        the patient's blood pressure, pulse, and oxygen                        saturations were monitored continuously. The Endoscope                        was introduced through the mouth, and advanced to the                        third part of duodenum. The upper GI endoscopy was                        accomplished without difficulty. The patient tolerated                        the procedure well. Findings:      The Z-line was irregular. Biopsies were taken with a cold forceps for       histology. Biopsies were taken with a cold forceps for histology.      The exam of the esophagus was otherwise normal.      No evidence of esophageal varices.      Mild portal hypertensive gastropathy was found in the gastric body.       Biopsies were taken with a cold forceps for histology.      Patchy mild inflammation characterized by erythema was found in the       gastric antrum. Biopsies were taken with a cold forceps for histology.      The examined duodenum was normal.      The cardia and gastric fundus were  normal on retroflexion otherwise no       abnormality noted, no gastric varices. Impression:           -  Z-line irregular. Biopsied.                       - Portal hypertensive gastropathy. Biopsied.                       - Erosive gastritis. Biopsied.                       - Normal examined duodenum. Recommendation:       - Use Aciphex (rabeprazole) 20 mg PO daily daily. Procedure Code(s):    --- Professional ---                       [id number], Esophagogastroduodenoscopy, flexible, transoral;                        with biopsy, single or multiple Diagnosis Code(s):    --- Professional ---                       K22.8, Other specified diseases of esophagus                       K76.6, Portal hypertension                       K31.89, Other diseases of stomach and duodenum                       K29.60, Other gastritis without bleeding                       R10.13, Epigastric pain                       R93.3, Abnormal findings on diagnostic imaging of other                        parts of digestive tract CPT copyright 2016 [employer] Association. All rights reserved. The codes documented in this report are preliminary and upon coder review may  be revised to meet current compliance requirements. Lollie Sails, MD 05/13/2016 2:48:33 PM This report has been signed electronically. Number of Addenda: 0 Note Initiated On: 05/13/2016 2:02 PM      [facility]

## 2016-05-13 NOTE — Anesthesia Preprocedure Evaluation (Signed)
Anesthesia Evaluation  Patient identified by MRN, date of birth, ID band Patient awake    Reviewed: Allergy & Precautions, NPO status , Patient's Chart, lab work & pertinent test results  History of Anesthesia Complications Negative for: history of anesthetic complications  Airway Mallampati: III       Dental   Pulmonary neg pulmonary ROS, former smoker,           Cardiovascular negative cardio ROS       Neuro/Psych    GI/Hepatic GERD  Medicated,(+) Hepatitis -, C  Endo/Other  negative endocrine ROSDiabetes: stones.  Renal/GU Renal disease     Musculoskeletal   Abdominal   Peds  Hematology   Anesthesia Other Findings   Reproductive/Obstetrics                             Anesthesia Physical Anesthesia Plan  ASA: II  Anesthesia Plan: General   Post-op Pain Management:    Induction: Intravenous  Airway Management Planned:   Additional Equipment:   Intra-op Plan:   Post-operative Plan:   Informed Consent: I have reviewed the patients History and Physical, chart, labs and discussed the procedure including the risks, benefits and alternatives for the proposed anesthesia with the patient or authorized representative who has indicated his/her understanding and acceptance.     Plan Discussed with:   Anesthesia Plan Comments:         Anesthesia Quick Evaluation

## 2016-05-13 NOTE — Transfer of Care (Signed)
Immediate Anesthesia Transfer of Care Note  Patient: Angie [name]  Procedure(s) Performed: Procedure(s): ESOPHAGOGASTRODUODENOSCOPY (EGD) WITH PROPOFOL (N/A)  Patient Location: PACU  Anesthesia Type:General  Level of Consciousness: awake, alert  and oriented  Airway & Oxygen Therapy: Patient Spontanous Breathing and Patient connected to nasal cannula oxygen  Post-op Assessment: Report given to RN and Post -op Vital signs reviewed and stable  Post vital signs: Reviewed and stable  Last Vitals:  Vitals:   05/13/16 1241 05/13/16 1439  BP: 133/72   Pulse: 67   Resp: 20   Temp: 36.5 C (!) 35.8 C    Last Pain:  Vitals:   05/13/16 1439  TempSrc: Tympanic         Complications: No apparent anesthesia complications

## 2016-05-13 NOTE — H&P (Signed)
Outpatient short stay form Pre-procedure 05/13/2016 1:56 PM Lollie Sails MD  Primary Physician: Dr Clayborn Bigness  Reason for visit:  EGD  History of present illness:  Patient is a 60 year old female presenting today for further evaluation. She has a history of nonalcoholic cirrhosis of the liver. She had an upper GI series and indicated an abnormality at the gastric fundus/GE junction/body region and direct visualization was recommended. She takes no aspirin or blood thinning agents. She does take pantoprazole but feels that this may be upsetting her stomach. Previously she was taking some Zantac.  She is receiving IV antibiotic this afternoon due to history of TKR in 2015.  Current Facility-Administered Medications:  .  0.9 %  sodium chloride infusion, , Intravenous, Continuous, Lollie Sails, MD, Last Rate: 20 mL/hr at 05/13/16 1339 .  0.9 %  sodium chloride infusion, , Intravenous, Continuous, Lollie Sails, MD .  ampicillin (OMNIPEN) 2 g in sodium chloride 0.9 % 50 mL IVPB, 2 g, Intravenous, STAT, Lollie Sails, MD, 2 g at 05/13/16 1339  Prescriptions Prior to Admission  Medication Sig Dispense Refill Last Dose  . cephALEXin (KEFLEX) 500 MG capsule Take 2,000 mg by mouth once as needed.     . traMADol (ULTRAM) 50 MG tablet 1 tablet by mouth every 4 to 6 hours as needed for pain   05/13/2016 at 0730  . Biotin 1 MG CAPS Take by mouth.   05/13/2016 at 0730  . ciprofloxacin (CIPRO) 500 MG tablet Take 500 mg by mouth 2 (two) times daily.   Not Taking at Unknown time  . ondansetron (ZOFRAN) 4 MG tablet Take 1 tablet (4 mg total) by mouth every 8 (eight) hours as needed for nausea or vomiting. (Patient not taking: Reported on 05/13/2016) 10 tablet 1 Not Taking at Unknown time  . tamsulosin (FLOMAX) 0.4 MG CAPS capsule Take 1 capsule (0.4 mg total) by mouth daily. 30 capsule 0 05/13/2016 at 0730     Allergies  Allergen Reactions  . Aspirin Nausea And Vomiting  . Nsaids Other (See  Comments)    Liver function/[place]at[place]     Past Medical History:  Diagnosis Date  . Hepatitis C   . Kidney stone   . Osteoarthritis   . Sepsis due to urinary tract infection ([place]) 2014    Review of systems:      Physical Exam    Heart and lungs: Regular rate and rhythm without rub or gallop, lungs are bilaterally clear.    HEENT: Normocephalic atraumatic eyes are anicteric    Other:     Pertinant exam for procedure: Soft nontender nondistended bowel sounds positive normoactive.    Planned proceedures: EGD and indicated procedures. I have discussed the risks benefits and complications of procedures to include not limited to bleeding, infection, perforation and the risk of sedation and the patient wishes to proceed.    Lollie Sails, MD Gastroenterology 05/13/2016  1:56 PM

## 2016-05-13 NOTE — Anesthesia Postprocedure Evaluation (Signed)
Anesthesia Post Note  Patient: Angie [name]  Procedure(s) Performed: Procedure(s) (LRB): ESOPHAGOGASTRODUODENOSCOPY (EGD) WITH PROPOFOL (N/A)  Patient location during evaluation: PACU Anesthesia Type: General Level of consciousness: awake and alert Pain management: pain level controlled Vital Signs Assessment: post-procedure vital signs reviewed and stable Respiratory status: spontaneous breathing and respiratory function stable Cardiovascular status: stable Anesthetic complications: no    Last Vitals:  Vitals:   05/13/16 1459 05/13/16 1509  BP: 123/77 126/70  Pulse:    Resp:    Temp:      Last Pain:  Vitals:   05/13/16 1439  TempSrc: Tympanic                 Khi Mcmillen K

## 2016-05-16 ENCOUNTER — Encounter: Payer: Self-pay | Admitting: Gastroenterology

## 2016-05-19 LAB — SURGICAL PATHOLOGY

## 2016-06-13 DIAGNOSIS — K7460 Unspecified cirrhosis of liver: Secondary | ICD-10-CM | POA: Diagnosis not present

## 2016-07-03 ENCOUNTER — Emergency Department
Admission: EM | Admit: 2016-07-03 | Discharge: 2016-07-03 | Disposition: A | Discharge status: Home / Self Care | Payer: Commercial Managed Care - HMO | Attending: Emergency Medicine | Admitting: Emergency Medicine

## 2016-07-03 ENCOUNTER — Emergency Department: Payer: Commercial Managed Care - HMO

## 2016-07-03 ENCOUNTER — Encounter: Payer: Self-pay | Admitting: Emergency Medicine

## 2016-07-03 DIAGNOSIS — S161XXA Strain of muscle, fascia and tendon at neck level, initial encounter: Secondary | ICD-10-CM

## 2016-07-03 DIAGNOSIS — M62838 Other muscle spasm: Secondary | ICD-10-CM

## 2016-07-03 DIAGNOSIS — Z79899 Other long term (current) drug therapy: Secondary | ICD-10-CM | POA: Insufficient documentation

## 2016-07-03 DIAGNOSIS — Y92009 Unspecified place in unspecified non-institutional (private) residence as the place of occurrence of the external cause: Secondary | ICD-10-CM

## 2016-07-03 DIAGNOSIS — Y929 Unspecified place or not applicable: Secondary | ICD-10-CM | POA: Insufficient documentation

## 2016-07-03 DIAGNOSIS — Z87891 Personal history of nicotine dependence: Secondary | ICD-10-CM | POA: Insufficient documentation

## 2016-07-03 DIAGNOSIS — S0990XA Unspecified injury of head, initial encounter: Secondary | ICD-10-CM | POA: Diagnosis not present

## 2016-07-03 DIAGNOSIS — R51 Headache: Secondary | ICD-10-CM | POA: Diagnosis not present

## 2016-07-03 DIAGNOSIS — W19XXXA Unspecified fall, initial encounter: Secondary | ICD-10-CM

## 2016-07-03 DIAGNOSIS — W1789XA Other fall from one level to another, initial encounter: Secondary | ICD-10-CM | POA: Insufficient documentation

## 2016-07-03 DIAGNOSIS — Y999 Unspecified external cause status: Secondary | ICD-10-CM | POA: Diagnosis not present

## 2016-07-03 DIAGNOSIS — M542 Cervicalgia: Secondary | ICD-10-CM | POA: Diagnosis not present

## 2016-07-03 DIAGNOSIS — Y939 Activity, unspecified: Secondary | ICD-10-CM | POA: Diagnosis not present

## 2016-07-03 LAB — BASIC METABOLIC PANEL
ANION GAP: 7 (ref 5–15)
BUN: 18 mg/dL (ref 6–20)
CALCIUM: 9.6 mg/dL (ref 8.9–10.3)
CO2: 27 mmol/L (ref 22–32)
Chloride: 103 mmol/L (ref 101–111)
Creatinine, Ser: 0.59 mg/dL (ref 0.44–1.00)
Glucose, Bld: 122 mg/dL — ABNORMAL HIGH (ref 65–99)
Potassium: 4.1 mmol/L (ref 3.5–5.1)
Sodium: 137 mmol/L (ref 135–145)

## 2016-07-03 LAB — CBC WITH DIFFERENTIAL/PLATELET
BASOS ABS: 0 10*3/uL (ref 0–0.1)
BASOS PCT: 0 %
Eosinophils Absolute: 0 10*3/uL (ref 0–0.7)
Eosinophils Relative: 0 %
HEMATOCRIT: 45.2 % (ref 35.0–47.0)
HEMOGLOBIN: 15.4 g/dL (ref 12.0–16.0)
Lymphocytes Relative: 17 %
Lymphs Abs: 1.7 10*3/uL (ref 1.0–3.6)
MCH: 32 pg (ref 26.0–34.0)
MCHC: 34.1 g/dL (ref 32.0–36.0)
MCV: 93.7 fL (ref 80.0–100.0)
Monocytes Absolute: 0.8 10*3/uL (ref 0.2–0.9)
Monocytes Relative: 8 %
NEUTROS ABS: 7.1 10*3/uL — AB (ref 1.4–6.5)
NEUTROS PCT: 75 %
Platelets: 119 10*3/uL — ABNORMAL LOW (ref 150–440)
RBC: 4.82 MIL/uL (ref 3.80–5.20)
RDW: 12.6 % (ref 11.5–14.5)
WBC: 9.6 10*3/uL (ref 3.6–11.0)

## 2016-07-03 MED ORDER — CYCLOBENZAPRINE HCL 10 MG PO TABS: 10.0000 mg | ORAL_TABLET | Freq: Three times a day (TID) | ORAL | 0 refills | Status: DC | PRN

## 2016-07-03 MED ORDER — HYDROCODONE-ACETAMINOPHEN 5-325 MG PO TABS
1.0000 | ORAL_TABLET | Freq: Once | ORAL | Status: AC
  Administered 2016-07-03: 1 via ORAL
  Filled 2016-07-03: qty 1

## 2016-07-03 MED ORDER — HYDROCODONE-ACETAMINOPHEN 5-325 MG PO TABS: 1.0000 | ORAL_TABLET | Freq: Four times a day (QID) | ORAL | 0 refills | Status: DC | PRN

## 2016-07-03 MED ORDER — ORPHENADRINE CITRATE 30 MG/ML IJ SOLN
60.0000 mg | Freq: Two times a day (BID) | INTRAMUSCULAR | Status: DC
  Administered 2016-07-03: 60 mg via INTRAMUSCULAR
  Filled 2016-07-03: qty 2

## 2016-07-03 NOTE — ED Notes (Signed)

## 2016-07-03 NOTE — ED Triage Notes (Signed)
Patient presents to the ED post fall yesterday.  Patient states, "we are building a new deck and I bent over and fell off and hit my head."  Patient reports falling approximately 4 feet.  Patient reports hitting her head but denies passing out.  Patient states, "I've been feeling worse and worse, my neck feels really stiff."  Patient ambulatory to triage.

## 2016-07-03 NOTE — ED Provider Notes (Signed)
[name] Emergency Department Provider Note  ____________________________________________  Time seen: Approximately 1:44 PM  I have reviewed Angie triage vital signs and Angie nursing notes.   HISTORY  Chief Complaint Fall    HPI Angie Angie [name] is a 60 y.o. female, NAD, presents to [facility] emergency department with one-day history of neck pain and stiffness. States she fell off of her deck last night that was 4 feet off Angie ground. States she hit her head on Angie ground below. That time has had constant pain in her neck and upper shoulders.  Denies LOC, visual changes, dizziness, extremity numbness/weakness/tingling, no focal numbness/weakness/tingling, abdominal pain, nausea, vomiting or headache.  Denies chest pain or shortness of breath. She has a prescription for tramadol and tried that for Angie pain, but got no relief.  States Angie pain is a 8/10 intensity. She is accompanied by her husband who notes that Angie Angie [name] has had no slurred speech, changes in speech or gait since Angie incident and demeanor has been normal.   Past Medical History:  Diagnosis Date  . Hepatitis C   . Kidney stone   . Osteoarthritis   . Sepsis due to urinary tract infection [facility]) 2014    [facility] [name] Active Problem List   Diagnosis Date Noted  . Autoimmune thrombocytopenia ([place]) 02/21/2015  . Ureteral stone with hydronephrosis 02/17/2015  . Cirrhosis of liver not due to alcohol ([place]) 03/03/2014    Past Surgical History:  Procedure Laterality Date  . ESOPHAGOGASTRODUODENOSCOPY (EGD) WITH PROPOFOL N/A 05/13/2016   Procedure: ESOPHAGOGASTRODUODENOSCOPY (EGD) WITH PROPOFOL;  Surgeon: Angie Sails, MD;  Location: [facility] END[facility]COPY;  Service: Endoscopy;  Laterality: N/A;  . JOINT REPLACEMENT Left 2015  . TONSILLECTOMY    . TONSILLECTOMY    . TOTAL KNEE ARTHROPLASTY      Prior to Admission medications   Medication Sig Start Date End Date Taking? Authorizing Provider  Biotin 1 MG CAPS  Take by mouth.    Historical Provider, MD  cephALEXin (KEFLEX) 500 MG capsule Take 2,000 mg by mouth once as needed.    Historical Provider, MD  ciprofloxacin (CIPRO) 500 MG tablet Take 500 mg by mouth 2 (two) times daily.    Historical Provider, MD  cyclobenzaprine (FLEXERIL) 10 MG tablet Take 1 tablet (10 mg total) by mouth 3 (three) times daily as needed for muscle spasms. 07/03/16   Angie Angie [name] L Angie Seybold, PA-C  HYDROcodone-acetaminophen (NORCO) 5-325 MG tablet Take 1 tablet by mouth every 6 (six) hours as needed for severe pain. 07/03/16   Angie Angie [name] L Angie Digman, PA-C  ondansetron (ZOFRAN) 4 MG tablet Take 1 tablet (4 mg total) by mouth every 8 (eight) hours as needed for nausea or vomiting. Angie [name] not taking: Reported on 05/13/2016 02/16/15   Angie Roca, MD  tamsulosin (FLOMAX) 0.4 MG CAPS capsule Take 1 capsule (0.4 mg total) by mouth daily. 02/17/15   Angie Frock, MD  traMADol [facility]ie [name]) 50 MG tablet 1 tablet by mouth every 4 to 6 hours as needed for pain 06/12/14   Historical Provider, MD    Allergies Aspirin and Nsaids  Family History  Problem Relation Age of Onset  . Liver cancer Father     Social History Social History  Substance Use Topics  . Smoking status: Former Smoker    Types: Cigarettes  . Smokeless tobacco: Never Used  . Alcohol use 0.6 oz/week    1 Glasses of wine per week     Comment: occasionally last week     Review  of Systems  Constitutional: No fever/chills, Fatigue Eyes: No visual changes. Cardiovascular: No chest pain. Respiratory: No shortness of breath. No wheezing.  Gastrointestinal: No abdominal pain.  No nausea, vomiting.   Musculoskeletal: Positive for neck pain that radiates to her shoulders with stiffness.   Skin: Negative for rash, redness, swelling, open wounds or lacerations. Neurological: Negative for headaches, focal weakness or numbness.No tingling. No saddle paresthesias or loss of bowel or bladder control. No LOC, dizziness, lightheadedness. 10-point  ROS otherwise negative.  ____________________________________________   PHYSICAL EXAM:  VITAL SIGNS: ED Triage Vitals  Enc Vitals Group     BP 07/03/16 1257 (!) 163/95     Pulse Rate 07/03/16 1257 89     Resp 07/03/16 1257 16     Temp 07/03/16 1257 98.2 F (36.8 C)     Temp Source 07/03/16 1257 Oral     SpO2 07/03/16 1257 95 %     Weight 07/03/16 1254 165 lb (74.8 kg)     Height 07/03/16 1254 5' 5.5" (1.664 m)     Head Circumference --      Peak Flow --      Pain Score 07/03/16 1254 9     Pain Loc --      Pain Edu? --      Excl. in [place]? --     Constitutional: Alert and oriented. Well appearing and in no acute distress. Eyes: Conjunctivae are normal without icterus or injection. PERRLA. EOMI without pain.  Head: Atraumatic. ENT:      Mouth/Throat: Mucous membranes are moist. No exudates or erythema. Airway is patent. Uvula is midline. Neck: Central cervical spine tenderness to palpation over C7 prominence without step-offs or bony abnormalities. Severe trapezial muscle spasm noted bilaterally along with bilateral cervical paraspinal muscle spasms. Range of motion of Angie cervical spine is decreased due to these muscle spasms.  Hematological/Lymphatic/Immunilogical: No cervical lymphadenopathy. Cardiovascular: Normal rate, regular rhythm. Normal S1 and S2.  No murmurs, rubs, gallops. Good peripheral circulation. Respiratory: Normal respiratory effort without tachypnea or retractions. Lungs CTAB with breath sounds noted in all lung fields. No wheeze, rhonchi, rales. Musculoskeletal:  Full range of motion of bilateral upper and lower extremities without pain or difficulty. Neurologic:  Normal speech and language. Normal gait. No gross focal neurologic deficits are appreciated. Cranial nerves III-XII grossly intact. Heel to shin within normal limits. Finger to nose within normal limits. Pronator drift was negative. Sensation to light touch grossly intact about bilateral upper and lower  extremities. Muscle strength is 5/5 in upper and lower extremities and equal bilaterally.  Skin:  Skin is warm, dry and intact. No rash, redness, swelling, bruising, open wounds or lacerations noted.  Psychiatric: Mood and affect are normal. Speech and behavior are normal. Angie [name] exhibits appropriate insight and judgement.   ____________________________________________   LABS (all labs ordered are listed, but only abnormal results are displayed)  Labs Reviewed  BASIC METABOLIC PANEL - Abnormal; Notable for Angie following:       Result Value   Glucose, Bld 122 (*)    All other components within normal limits  CBC WITH DIFFERENTIAL/PLATELET - Abnormal; Notable for Angie following:    Platelets 119 (*)    Neutro Abs 7.1 (*)    All other components within normal limits   ____________________________________________  EKG  None ____________________________________________  RADIOLOGY I, [place], personally viewed and evaluated these images (plain radiographs) as part of my medical decision making, as well as reviewing Angie written report by  Angie radiologist.  Ct Head Wo Contrast  Result Date: 07/03/2016 CLINICAL DATA:  Angie [name] status post fall from deck. Headache. Generalized neck pain. No reported loss of consciousness. EXAM: CT HEAD WITHOUT CONTRAST CT CERVICAL SPINE WITHOUT CONTRAST TECHNIQUE: Multidetector CT imaging of Angie head and cervical spine was performed following Angie standard protocol without intravenous contrast. Multiplanar CT image reconstructions of Angie cervical spine were also generated. COMPARISON:  None. FINDINGS: CT HEAD FINDINGS Brain: Ventricles and sulci are appropriate for Angie [name]'s age. No evidence for acute cortically based infarct, intracranial hemorrhage, mass lesion or mass-effect. Vascular: Unremarkable. Skull: Intact. Sinuses/Orbits: Paranasal sinuses and mastoid air cells unremarkable. Other: None. CT CERVICAL SPINE FINDINGS Alignment: Straightening of Angie  normal cervical lordosis. Skull base and vertebrae: Intact. Soft tissues and spinal canal: No prevertebral fluid or swelling. No visible canal hematoma. Disc levels: Multilevel degenerative disc disease most pronounced C5-6 and C7-T1. Upper chest: Unremarkable. Other: None. IMPRESSION: No acute intracranial process. No acute cervical spine fracture. Multilevel degenerative disc disease. Electronically Signed   By: Lovey Newcomer M.D.   On: 07/03/2016 14:47   Ct Cervical Spine Wo Contrast  Result Date: 07/03/2016 CLINICAL DATA:  Angie [name] status post fall from deck. Headache. Generalized neck pain. No reported loss of consciousness. EXAM: CT HEAD WITHOUT CONTRAST CT CERVICAL SPINE WITHOUT CONTRAST TECHNIQUE: Multidetector CT imaging of Angie head and cervical spine was performed following Angie standard protocol without intravenous contrast. Multiplanar CT image reconstructions of Angie cervical spine were also generated. COMPARISON:  None. FINDINGS: CT HEAD FINDINGS Brain: Ventricles and sulci are appropriate for Angie [name]'s age. No evidence for acute cortically based infarct, intracranial hemorrhage, mass lesion or mass-effect. Vascular: Unremarkable. Skull: Intact. Sinuses/Orbits: Paranasal sinuses and mastoid air cells unremarkable. Other: None. CT CERVICAL SPINE FINDINGS Alignment: Straightening of Angie normal cervical lordosis. Skull base and vertebrae: Intact. Soft tissues and spinal canal: No prevertebral fluid or swelling. No visible canal hematoma. Disc levels: Multilevel degenerative disc disease most pronounced C5-6 and C7-T1. Upper chest: Unremarkable. Other: None. IMPRESSION: No acute intracranial process. No acute cervical spine fracture. Multilevel degenerative disc disease. Electronically Signed   By: Lovey Newcomer M.D.   On: 07/03/2016 14:47    ____________________________________________    PROCEDURES  Procedure(s) performed: None   Procedures   Medications  orphenadrine (NORFLEX) injection 60  mg (60 mg Intramuscular Given 07/03/16 1534)  HYDROcodone-acetaminophen (NORCO/VICODIN) 5-325 MG per tablet 1 tablet (1 tablet Oral Given 07/03/16 1534)     ____________________________________________   INITIAL IMPRESSION / ASSESSMENT AND PLAN / ED COURSE  Pertinent labs & imaging results that were available during my care of Angie Angie [name] were reviewed by me and considered in my medical decision making (see chart for details).  Clinical Course     Angie [name]'s diagnosis is consistent with Cervical strain, head injury and neck muscle spasm due to fall. Angie [name] will be discharged home with prescriptions for Norco and Flexeril to take as directed. Angie [name] is to follow up with her primary care provider or Dr. Roland Rack in orthopedics in 72 hours for recheck. Angie [name] is given ED precautions to return to Angie ED for any worsening or new symptoms.    ____________________________________________  FINAL CLINICAL IMPRESSION(S) / ED DIAGNOSES  Final diagnoses:  Cervical strain, acute, initial encounter  Injury of head, initial encounter  Fall in home, initial encounter  Neck muscle spasm      NEW MEDICATIONS STARTED DURING THIS VISIT:  Discharge Medication List as of 07/03/2016  4:13 PM    START  taking these medications   Details  cyclobenzaprine (FLEXERIL) 10 MG tablet Take 1 tablet (10 mg total) by mouth 3 (three) times daily as needed for muscle spasms., Starting Sun 07/03/2016, Print    HYDROcodone-acetaminophen (NORCO) 5-325 MG tablet Take 1 tablet by mouth every 6 (six) hours as needed for severe pain., Starting Sun 07/03/2016, [place], PA-C 07/03/16 1718    Nance Pear, MD 07/03/16 [phone]

## 2016-07-05 ENCOUNTER — Other Ambulatory Visit: Payer: Self-pay | Admitting: Gastroenterology

## 2016-07-05 DIAGNOSIS — K7460 Unspecified cirrhosis of liver: Secondary | ICD-10-CM | POA: Diagnosis not present

## 2016-07-05 DIAGNOSIS — Z800 Family history of malignant neoplasm of digestive organs: Secondary | ICD-10-CM | POA: Diagnosis not present

## 2016-07-11 DIAGNOSIS — R319 Hematuria, unspecified: Secondary | ICD-10-CM | POA: Diagnosis not present

## 2016-07-11 DIAGNOSIS — N390 Urinary tract infection, site not specified: Secondary | ICD-10-CM | POA: Diagnosis not present

## 2016-07-12 ENCOUNTER — Ambulatory Visit
Admission: RE | Admit: 2016-07-12 | Discharge: 2016-07-12 | Disposition: A | Discharge status: Home / Self Care | Payer: Commercial Managed Care - HMO | Source: Ambulatory Visit | Attending: Gastroenterology | Admitting: Gastroenterology

## 2016-07-12 DIAGNOSIS — K7460 Unspecified cirrhosis of liver: Secondary | ICD-10-CM

## 2016-08-04 DIAGNOSIS — F17210 Nicotine dependence, cigarettes, uncomplicated: Secondary | ICD-10-CM | POA: Diagnosis not present

## 2016-08-04 DIAGNOSIS — N390 Urinary tract infection, site not specified: Secondary | ICD-10-CM | POA: Diagnosis not present

## 2016-08-04 DIAGNOSIS — M545 Low back pain: Secondary | ICD-10-CM | POA: Diagnosis not present

## 2016-09-19 ENCOUNTER — Inpatient Hospital Stay: Payer: Medicare HMO | Attending: Oncology

## 2016-09-19 DIAGNOSIS — D693 Immune thrombocytopenic purpura: Secondary | ICD-10-CM | POA: Diagnosis not present

## 2016-09-19 DIAGNOSIS — D696 Thrombocytopenia, unspecified: Secondary | ICD-10-CM

## 2016-09-19 LAB — CBC WITH DIFFERENTIAL/PLATELET
BASOS PCT: 1 %
Basophils Absolute: 0 10*3/uL (ref 0–0.1)
Eosinophils Absolute: 0.1 10*3/uL (ref 0–0.7)
Eosinophils Relative: 1 %
HEMATOCRIT: 42.3 % (ref 35.0–47.0)
HEMOGLOBIN: 14.6 g/dL (ref 12.0–16.0)
Lymphocytes Relative: 39 %
Lymphs Abs: 2.4 10*3/uL (ref 1.0–3.6)
MCH: 31.9 pg (ref 26.0–34.0)
MCHC: 34.5 g/dL (ref 32.0–36.0)
MCV: 92.5 fL (ref 80.0–100.0)
Monocytes Absolute: 0.5 10*3/uL (ref 0.2–0.9)
Monocytes Relative: 8 %
NEUTROS ABS: 3.2 10*3/uL (ref 1.4–6.5)
NEUTROS PCT: 51 %
Platelets: 111 10*3/uL — ABNORMAL LOW (ref 150–440)
RBC: 4.57 MIL/uL (ref 3.80–5.20)
RDW: 13.1 % (ref 11.5–14.5)
WBC: 6.2 10*3/uL (ref 3.6–11.0)

## 2016-10-03 ENCOUNTER — Encounter: Payer: Self-pay | Admitting: *Deleted

## 2016-10-04 ENCOUNTER — Encounter
Admission: RE | Disposition: A | Discharge status: Home / Self Care | Payer: Self-pay | Source: Ambulatory Visit | Attending: Gastroenterology

## 2016-10-04 ENCOUNTER — Ambulatory Visit: Payer: Medicare Other | Admitting: Anesthesiology

## 2016-10-04 ENCOUNTER — Ambulatory Visit
Admission: RE | Admit: 2016-10-04 | Discharge: 2016-10-04 | Disposition: A | Discharge status: Home / Self Care | Payer: Medicare Other | Source: Ambulatory Visit | Attending: Gastroenterology | Admitting: Gastroenterology

## 2016-10-04 DIAGNOSIS — Z87891 Personal history of nicotine dependence: Secondary | ICD-10-CM | POA: Diagnosis not present

## 2016-10-04 DIAGNOSIS — Z1211 Encounter for screening for malignant neoplasm of colon: Secondary | ICD-10-CM | POA: Insufficient documentation

## 2016-10-04 DIAGNOSIS — D128 Benign neoplasm of rectum: Secondary | ICD-10-CM | POA: Diagnosis not present

## 2016-10-04 DIAGNOSIS — B182 Chronic viral hepatitis C: Secondary | ICD-10-CM | POA: Insufficient documentation

## 2016-10-04 DIAGNOSIS — Z800 Family history of malignant neoplasm of digestive organs: Secondary | ICD-10-CM | POA: Insufficient documentation

## 2016-10-04 DIAGNOSIS — K7469 Other cirrhosis of liver: Secondary | ICD-10-CM | POA: Insufficient documentation

## 2016-10-04 DIAGNOSIS — Z79899 Other long term (current) drug therapy: Secondary | ICD-10-CM | POA: Diagnosis not present

## 2016-10-04 DIAGNOSIS — J449 Chronic obstructive pulmonary disease, unspecified: Secondary | ICD-10-CM | POA: Diagnosis not present

## 2016-10-04 DIAGNOSIS — K621 Rectal polyp: Secondary | ICD-10-CM | POA: Insufficient documentation

## 2016-10-04 HISTORY — PX: COLONOSCOPY WITH PROPOFOL: SHX5780

## 2016-10-04 LAB — CBC WITH DIFFERENTIAL/PLATELET
BASOS ABS: 0 10*3/uL (ref 0–0.1)
BASOS PCT: 0 %
EOS ABS: 0.1 10*3/uL (ref 0–0.7)
EOS PCT: 1 %
HCT: 46.2 % (ref 35.0–47.0)
Hemoglobin: 16.2 g/dL — ABNORMAL HIGH (ref 12.0–16.0)
Lymphocytes Relative: 17 %
Lymphs Abs: 1 10*3/uL (ref 1.0–3.6)
MCH: 32.2 pg (ref 26.0–34.0)
MCHC: 35.1 g/dL (ref 32.0–36.0)
MCV: 91.8 fL (ref 80.0–100.0)
MONO ABS: 0.4 10*3/uL (ref 0.2–0.9)
MONOS PCT: 6 %
Neutro Abs: 4.7 10*3/uL (ref 1.4–6.5)
Neutrophils Relative %: 76 %
PLATELETS: 111 10*3/uL — AB (ref 150–440)
RBC: 5.03 MIL/uL (ref 3.80–5.20)
RDW: 13.3 % (ref 11.5–14.5)
WBC: 6.2 10*3/uL (ref 3.6–11.0)

## 2016-10-04 LAB — PROTIME-INR
INR: 0.97
Prothrombin Time: 12.9 seconds (ref 11.4–15.2)

## 2016-10-04 SURGERY — COLONOSCOPY WITH PROPOFOL
Anesthesia: General

## 2016-10-04 MED ORDER — LIDOCAINE 2% (20 MG/ML) 5 ML SYRINGE
INTRAMUSCULAR | Status: DC | PRN
  Administered 2016-10-04: 40 mg via INTRAVENOUS

## 2016-10-04 MED ORDER — SODIUM CHLORIDE 0.9 % IV SOLN: INTRAVENOUS | Status: DC

## 2016-10-04 MED ORDER — PROPOFOL 500 MG/50ML IV EMUL
INTRAVENOUS | Status: DC | PRN
  Administered 2016-10-04: 140 ug/kg/min via INTRAVENOUS

## 2016-10-04 MED ORDER — MIDAZOLAM HCL 2 MG/2ML IJ SOLN
INTRAMUSCULAR | Status: AC
  Filled 2016-10-04: qty 2

## 2016-10-04 MED ORDER — PROPOFOL 500 MG/50ML IV EMUL
INTRAVENOUS | Status: AC
  Filled 2016-10-04: qty 50

## 2016-10-04 MED ORDER — PROPOFOL 10 MG/ML IV BOLUS
INTRAVENOUS | Status: DC | PRN
  Administered 2016-10-04: 100 mg via INTRAVENOUS

## 2016-10-04 MED ORDER — SODIUM CHLORIDE 0.9 % IV SOLN
INTRAVENOUS | Status: DC
  Administered 2016-10-04: 15:00:00 via INTRAVENOUS

## 2016-10-04 MED ORDER — MIDAZOLAM HCL 5 MG/5ML IJ SOLN
INTRAMUSCULAR | Status: DC | PRN
  Administered 2016-10-04 (×2): 1 mg via INTRAVENOUS

## 2016-10-04 MED ORDER — SODIUM CHLORIDE 0.9 % IV SOLN
2.0000 g | Freq: Once | INTRAVENOUS | Status: AC
  Administered 2016-10-04: 2 g via INTRAVENOUS

## 2016-10-04 MED ORDER — FENTANYL CITRATE (PF) 100 MCG/2ML IJ SOLN
INTRAMUSCULAR | Status: DC | PRN
  Administered 2016-10-04 (×2): 50 ug via INTRAVENOUS

## 2016-10-04 MED ORDER — FENTANYL CITRATE (PF) 100 MCG/2ML IJ SOLN
INTRAMUSCULAR | Status: AC
  Filled 2016-10-04: qty 2

## 2016-10-04 NOTE — Anesthesia Preprocedure Evaluation (Signed)
Anesthesia Evaluation  Patient identified by MRN, date of birth, ID band Patient awake    Reviewed: Allergy & Precautions, NPO status , Patient's Chart, lab work & pertinent test results  Airway Mallampati: II       Dental  (+) Teeth Intact   Pulmonary COPD, former smoker,     + decreased breath sounds      Cardiovascular Exercise Tolerance: Good  Rhythm:Regular Rate:Normal     Neuro/Psych    GI/Hepatic negative GI ROS, Neg liver ROS,   Endo/Other    Renal/GU negative Renal ROS     Musculoskeletal   Abdominal Normal abdominal exam  (+)   Peds negative pediatric ROS (+)  Hematology negative hematology ROS (+)   Anesthesia Other Findings   Reproductive/Obstetrics                             Anesthesia Physical Anesthesia Plan  ASA: II  Anesthesia Plan: General   Post-op Pain Management:    Induction: Intravenous  Airway Management Planned: Natural Airway and Nasal Cannula  Additional Equipment:   Intra-op Plan:   Post-operative Plan:   Informed Consent: I have reviewed the patients History and Physical, chart, labs and discussed the procedure including the risks, benefits and alternatives for the proposed anesthesia with the patient or authorized representative who has indicated his/her understanding and acceptance.     Plan Discussed with: CRNA and Surgeon  Anesthesia Plan Comments:         Anesthesia Quick Evaluation

## 2016-10-04 NOTE — Transfer of Care (Signed)
Immediate Anesthesia Transfer of Care Note  Patient: Angie [name]  Procedure(s) Performed: Procedure(s): COLONOSCOPY WITH PROPOFOL (N/A)  Patient Location: PACU and Endoscopy Unit  Anesthesia Type:General  Level of Consciousness: awake, oriented and patient cooperative  Airway & Oxygen Therapy: Patient Spontanous Breathing and Patient connected to nasal cannula oxygen  Post-op Assessment: Report given to RN and Post -op Vital signs reviewed and stable  Post vital signs: Reviewed and stable  Last Vitals:  Vitals:   10/04/16 1314  BP: (!) 173/90  Pulse: 93  Resp: 20  Temp: 36.3 C    Last Pain:  Vitals:   10/04/16 1314  TempSrc: Tympanic  PainSc: 7       Patients Stated Pain Goal: 0 ([id number])  Complications: No apparent anesthesia complications

## 2016-10-04 NOTE — H&P (Addendum)
Outpatient short stay form Pre-procedure 10/04/2016 2:40 PM Lollie Sails MD  Primary Physician: Dr Clayborn Bigness  Reason for visit:  Colonoscopy  History of present illness:  Patient is a 61 year old female presenting today as above. There is a family history of colon cancer in her father. Since last colonoscopy was about 6 years ago. She does have a history of cirrhosis secondary to chronic hepatitis C. This has been treated and she is in sustained remission. There is some fibrosis of the liver. She tolerated her prep well. She takes no aspirin or blood thinning agents.    Current Facility-Administered Medications:  .  0.9 %  sodium chloride infusion, , Intravenous, Continuous, Lollie Sails, MD, Last Rate: 20 mL/hr at 10/04/16 1434 .  0.9 %  sodium chloride infusion, , Intravenous, Continuous, Lollie Sails, MD .  ampicillin (OMNIPEN) 2 g in sodium chloride 0.9 % 50 mL IVPB, 2 g, Intravenous, Once, Lollie Sails, MD, 2 g at 10/04/16 1434  Prescriptions Prior to Admission  Medication Sig Dispense Refill Last Dose  . Biotin 1 MG CAPS Take by mouth.   10/03/2016 at Unknown time  . ciprofloxacin (CIPRO) 500 MG tablet Take 500 mg by mouth 2 (two) times daily.   10/03/2016 at Unknown time  . cyclobenzaprine (FLEXERIL) 10 MG tablet Take 1 tablet (10 mg total) by mouth 3 (three) times daily as needed for muscle spasms. 21 tablet 0 Past Week at Unknown time  . HYDROcodone-acetaminophen (NORCO) 5-325 MG tablet Take 1 tablet by mouth every 6 (six) hours as needed for severe pain. 12 tablet 0 10/03/2016 at Unknown time  . RABEprazole (ACIPHEX) 20 MG tablet Take 20 mg by mouth daily.   Past Week at Unknown time  . tamsulosin (FLOMAX) 0.4 MG CAPS capsule Take 1 capsule (0.4 mg total) by mouth daily. 30 capsule 0 10/03/2016 at Unknown time  . traMADol (ULTRAM) 50 MG tablet 1 tablet by mouth every 4 to 6 hours as needed for pain   10/03/2016 at Unknown time  . cephALEXin (KEFLEX) 500 MG capsule  Take 2,000 mg by mouth once as needed.   Not Taking at Unknown time  . ondansetron (ZOFRAN) 4 MG tablet Take 1 tablet (4 mg total) by mouth every 8 (eight) hours as needed for nausea or vomiting. (Patient not taking: Reported on 05/13/2016) 10 tablet 1 Not Taking at Unknown time     Allergies  Allergen Reactions  . Aspirin Nausea And Vomiting  . Nsaids Other (See Comments)    Liver function/[place]     Past Medical History:  Diagnosis Date  . Hepatitis C   . Kidney stone   . Osteoarthritis   . Sepsis due to urinary tract infection ([place]) 2014    Review of systems:      Physical Exam    Heart and lungs: Regular rate and rhythm without rub or gallop, lungs are bilaterally clear.    HEENT: Normal cephalic atraumatic eyes are anicteric    Other:     Pertinant exam for procedure: Soft nontender nondistended bowel sounds positive normoactive.    Planned proceedures: Colonoscopy and indicated procedures. I have discussed the risks benefits and complications of procedures to include not limited to bleeding, infection, perforation and the risk of sedation and the patient wishes to proceed.    Lollie Sails, MD Gastroenterology 10/04/2016  2:40 PM

## 2016-10-04 NOTE — Op Note (Signed)
[facility] Patient Name: Angie [name] Procedure Date: 10/04/2016 2:41 PM MRN: [id number] Account #: [id number] Date of Birth: August 30, 1955 Admit Type: Outpatient Age: 61 Room: [facility] ENDO ROOM 3 Gender: Female Note Status: Finalized Procedure:            Colonoscopy Indications:          Family history of colon cancer in a first-degree                        relative Providers:            Lollie Sails, MD Referring MD:         Lavera Guise, MD (Referring MD) Medicines:            Monitored Anesthesia Care Complications:        No immediate complications. Procedure:            Pre-Anesthesia Assessment:                       - ASA Grade Assessment: II - A patient with mild                        systemic disease.                       After obtaining informed consent, the colonoscope was                        passed under direct vision. Throughout the procedure,                        the patient's blood pressure, pulse, and oxygen                        saturations were monitored continuously. The                        Colonoscope was introduced through the anus and                        advanced to the the cecum, identified by appendiceal                        orifice and ileocecal valve. The colonoscopy was                        performed without difficulty. The patient tolerated the                        procedure well. The quality of the bowel preparation                        was good. Findings:      Two sessile polyps were found in the rectum. The polyps were less than 1       mm in size. These polyps were removed with a cold biopsy forceps.       Resection and retrieval were complete.      The digital rectal exam was normal.      The exam was otherwise without abnormality. Impression:           - Two  less than 1 mm polyps in the rectum, removed with                        a cold biopsy forceps. Resected and retrieved.                  - The examination was otherwise normal. Recommendation:       - Discharge patient to home.                       - Telephone GI clinic for pathology results in 1 week. Procedure Code(s):    --- Professional ---                       [phone], Colonoscopy, flexible; with biopsy, single or                        multiple CPT copyright 2016 [employer]. All rights reserved. The codes documented in this report are preliminary and upon coder review may  be revised to meet current compliance requirements. Lollie Sails, MD 10/04/2016 3:07:38 PM This report has been signed electronically. Number of Addenda: 0 Note Initiated On: 10/04/2016 2:41 PM Scope Withdrawal Time: 0 hours 7 minutes 16 seconds  Total Procedure Duration: 0 hours 16 minutes 32 seconds       [facility]

## 2016-10-04 NOTE — Anesthesia Post-op Follow-up Note (Cosign Needed)
Anesthesia QCDR form completed.        

## 2016-10-05 ENCOUNTER — Encounter: Payer: Self-pay | Admitting: Gastroenterology

## 2016-10-05 NOTE — Anesthesia Postprocedure Evaluation (Signed)
Anesthesia Post Note  Patient: Angie [name]  Procedure(s) Performed: Procedure(s) (LRB): COLONOSCOPY WITH PROPOFOL (N/A)  Patient location during evaluation: PACU Anesthesia Type: General Level of consciousness: awake Pain management: pain level controlled Vital Signs Assessment: post-procedure vital signs reviewed and stable Respiratory status: spontaneous breathing Cardiovascular status: stable Postop Assessment: no headache Anesthetic complications: no     Last Vitals:  Vitals:   10/04/16 1526 10/04/16 1546  BP: (!) 165/93 (!) 135/100  Pulse: 84 75  Resp: (!) 21 19  Temp:      Last Pain:  Vitals:   10/04/16 1314  TempSrc: Tympanic  PainSc: 7                  VAN STAVEREN,Omran Keelin

## 2016-10-06 LAB — SURGICAL PATHOLOGY

## 2016-11-28 DIAGNOSIS — I10 Essential (primary) hypertension: Secondary | ICD-10-CM | POA: Diagnosis not present

## 2016-11-28 DIAGNOSIS — M545 Low back pain: Secondary | ICD-10-CM | POA: Diagnosis not present

## 2016-11-28 DIAGNOSIS — R319 Hematuria, unspecified: Secondary | ICD-10-CM | POA: Diagnosis not present

## 2016-11-28 DIAGNOSIS — F17210 Nicotine dependence, cigarettes, uncomplicated: Secondary | ICD-10-CM | POA: Diagnosis not present

## 2016-11-28 DIAGNOSIS — N390 Urinary tract infection, site not specified: Secondary | ICD-10-CM | POA: Diagnosis not present

## 2016-12-06 ENCOUNTER — Ambulatory Visit (INDEPENDENT_AMBULATORY_CARE_PROVIDER_SITE_OTHER): Payer: Medicare Other | Admitting: Urology

## 2016-12-06 ENCOUNTER — Encounter: Payer: Self-pay | Admitting: Urology

## 2016-12-06 DIAGNOSIS — R3121 Asymptomatic microscopic hematuria: Secondary | ICD-10-CM | POA: Diagnosis not present

## 2016-12-06 DIAGNOSIS — R319 Hematuria, unspecified: Secondary | ICD-10-CM | POA: Diagnosis not present

## 2016-12-06 LAB — MICROSCOPIC EXAMINATION
Bacteria, UA: NONE SEEN
WBC UA: NONE SEEN /HPF (ref 0–?)

## 2016-12-06 LAB — URINALYSIS, COMPLETE
Bilirubin, UA: NEGATIVE
Glucose, UA: NEGATIVE
KETONES UA: NEGATIVE
Leukocytes, UA: NEGATIVE
NITRITE UA: NEGATIVE
Protein, UA: NEGATIVE
SPEC GRAV UA: 1.02 (ref 1.005–1.030)
Urobilinogen, Ur: 0.2 mg/dL (ref 0.2–1.0)
pH, UA: 5.5 (ref 5.0–7.5)

## 2016-12-06 NOTE — Progress Notes (Signed)
12/06/2016 1:22 PM   Angie [name] Dec 19, 1955 [id number]  Referring provider: Lavera Guise, MD [address] [place], [place] [address]  Chief Complaint  Patient presents with  . Hematuria    HPI: 61 yo referred for microhematuria by Dr. Humphrey Rolls. Pt was seen 11/27/2016 when a UA showed large blood on dip. Ua today shows 3-10 rbc / hpf. She's had no gross hematuria. No dysuria, flank pain or stone passage.   She had Retuxan for thrombocytopenia in 2015. No chemo or XRT. She is a smoker.   Abd [place] Nov 2017 showed normal kidneys, no mass or hydro. She had lithotripsy for a left distal stone in 2016.    PMH: Past Medical History:  Diagnosis Date  . Hepatitis C   . Kidney stone   . Osteoarthritis   . Sepsis due to urinary tract infection [facility]) 2014    Surgical History: Past Surgical History:  Procedure Laterality Date  . COLONOSCOPY WITH PROPOFOL N/A 10/04/2016   Procedure: COLONOSCOPY WITH PROPOFOL;  Surgeon: Lollie Sails, MD;  Location[facility] ENDOSCOPY;  Service: Endoscopy;  Laterality: N/A;  . ESOPHAGOGASTRODUODENOSCOPY (EGD) WITH PROPOFOL N/A 05/13/2016   Procedure: ESOPHAGOGASTRODUODENOSCOPY (EGD) WITH PROPOFOL;  Surgeon: Lollie Sails, MD;  Location: [facility] ENDOSCOPY;  Service: Endoscopy;  Laterality: N/A;  . JOINT REPLACEMENT Left 2015  . TONSILLECTOMY    . TONSILLECTOMY    . TOTAL KNEE ARTHROPLASTY      Home Medications:  Allergies as of 12/06/2016      Reactions   Aspirin Nausea And Vomiting   Nsaids Other (See Comments)   Liver function/plateles      Medication List       Accurate as of 12/06/16  1:22 PM. Always use your most recent med list.          Biotin 1 MG Caps Take by mouth.   cyclobenzaprine 10 MG tablet Commonly known as:  FLEXERIL Take 1 tablet (10 mg total) by mouth 3 (three) times daily as needed for muscle spasms.   HYDROcodone-acetaminophen 5-325 MG tablet Commonly known as:  NORCO Take 1 tablet by mouth every 6 (six) hours as  needed for severe pain.   lisinopril 5 MG tablet Commonly known as:  PRINIVIL,ZESTRIL Take 5 mg by mouth daily.   nitrofurantoin 100 MG capsule Commonly known as:  MACRODANTIN Take 100 mg by mouth 4 (four) times daily.   ondansetron 4 MG tablet Commonly known as:  ZOFRAN Take 1 tablet (4 mg total) by mouth every 8 (eight) hours as needed for nausea or vomiting.   RABEprazole 20 MG tablet Commonly known as:  ACIPHEX Take 20 mg by mouth daily.   tamsulosin 0.4 MG Caps capsule Commonly known as:  FLOMAX Take 1 capsule (0.4 mg total) by mouth daily.   traMADol 50 MG tablet Commonly known as:  ULTRAM 1 tablet by mouth every 4 to 6 hours as needed for pain       Allergies:  Allergies  Allergen Reactions  . Aspirin Nausea And Vomiting  . Nsaids Other (See Comments)    Liver function/plateles    Family History: Family History  Problem Relation Age of Onset  . Liver cancer Father     Social History:  reports that she has quit smoking. Her smoking use included Cigarettes. She has never used smokeless tobacco. She reports that she drinks about 0.6 oz of alcohol per week . She reports that she does not use drugs.  ROS: UROLOGY Frequent Urination?:  No Hard to postpone urination?: No Burning/pain with urination?: No Get up at night to urinate?: No Leakage of urine?: No Urine stream starts and stops?: No Trouble starting stream?: No Do you have to strain to urinate?: No Blood in urine?: Yes Urinary tract infection?: No Sexually transmitted disease?: No Injury to kidneys or bladder?: No Painful intercourse?: No Weak stream?: No Currently pregnant?: No Vaginal bleeding?: No Last menstrual period?: n  Gastrointestinal Nausea?: No Vomiting?: No Indigestion/heartburn?: No Diarrhea?: No Constipation?: No  Constitutional Fever: No Night sweats?: No Weight loss?: No Fatigue?: No  Skin Skin rash/lesions?: No Itching?: No  Eyes Blurred vision?: No Double  vision?: No  Ears/Nose/Throat Sore throat?: No Sinus problems?: No  Hematologic/Lymphatic Swollen glands?: No Easy bruising?: No  Cardiovascular Leg swelling?: No Chest pain?: No  Respiratory Cough?: No Shortness of breath?: No  Endocrine Excessive thirst?: No  Musculoskeletal Back pain?: Yes Joint pain?: Yes  Neurological Headaches?: No Dizziness?: No  Psychologic Depression?: No Anxiety?: No  Physical Exam: There were no vitals taken for this visit.  Constitutional:  Alert and oriented, No acute distress. HEENT: [place] AT, moist mucus membranes.  Trachea midline, no masses. Cardiovascular: No clubbing, cyanosis, or edema. Respiratory: Normal respiratory effort, no increased work of breathing. GI: Abdomen is soft, nontender, nondistended, no abdominal masses Skin: No rashes, bruises or suspicious lesions. Neurologic: Grossly intact, no focal deficits, moving all 4 extremities. Psychiatric: Normal mood and affect.  Laboratory Data: Lab Results  Component Value Date   WBC 6.2 10/04/2016   HGB 16.2 (H) 10/04/2016   HCT 46.2 10/04/2016   MCV 91.8 10/04/2016   PLT 111 (L) 10/04/2016    Lab Results  Component Value Date   CREATININE 0.59 07/03/2016    No results found for: PSA  No results found for: TESTOSTERONE  No results found for: HGBA1C  Urinalysis    Component Value Date/Time   COLORURINE YELLOW (A) 02/16/2015 0533   APPEARANCEUR Clear 02/17/2015 1551   LABSPEC 1.023 02/16/2015 0533   LABSPEC 1.020 02/24/2014 1302   PHURINE 6.0 02/16/2015 0533   GLUCOSEU Negative 02/17/2015 1551   GLUCOSEU Negative 02/24/2014 1302   HGBUR 2+ (A) 02/16/2015 0533   BILIRUBINUR Negative 02/17/2015 1551   BILIRUBINUR Negative 02/24/2014 1302   KETONESUR NEGATIVE 02/16/2015 0533   PROTEINUR Negative 02/17/2015 1551   PROTEINUR 30 (A) 02/16/2015 0533   NITRITE Negative 02/17/2015 1551   NITRITE NEGATIVE 02/16/2015 0533   LEUKOCYTESUR Negative 02/17/2015 1551     LEUKOCYTESUR Negative 02/24/2014 1302    Pertinent Imaging: Abd u/s   Assessment & Plan:    1. Hematuria, unspecified type - microscopic  - discussed with patient the nature r/b/a to CT and cystoscopy and she elects to proceed.   - Urinalysis, Complete   No Follow-up on file.  Festus Aloe, [facility] [address], [place], [place] [address] [phone]

## 2016-12-15 ENCOUNTER — Ambulatory Visit
Admission: RE | Admit: 2016-12-15 | Discharge: 2016-12-15 | Disposition: A | Discharge status: Home / Self Care | Payer: Medicare Other | Source: Ambulatory Visit | Attending: Urology | Admitting: Urology

## 2016-12-15 DIAGNOSIS — I700 Atherosclerosis of aorta: Secondary | ICD-10-CM | POA: Diagnosis not present

## 2016-12-15 DIAGNOSIS — N200 Calculus of kidney: Secondary | ICD-10-CM | POA: Diagnosis not present

## 2016-12-15 DIAGNOSIS — R3121 Asymptomatic microscopic hematuria: Secondary | ICD-10-CM | POA: Diagnosis not present

## 2016-12-15 DIAGNOSIS — K7460 Unspecified cirrhosis of liver: Secondary | ICD-10-CM | POA: Diagnosis not present

## 2016-12-15 MED ORDER — IOPAMIDOL (ISOVUE-300) INJECTION 61%
125.0000 mL | Freq: Once | INTRAVENOUS | Status: AC | PRN
  Administered 2016-12-15: 125 mL via INTRAVENOUS

## 2016-12-19 ENCOUNTER — Telehealth: Payer: Self-pay

## 2016-12-19 NOTE — Telephone Encounter (Signed)
Festus Aloe, MD  Lestine Box, LPN        Notify patient her CT scan showed no new findings and no worrisome findings. F/u for cystoscopy as planned    Spoke with pt in reference to CT results and cysto. Pt voiced understanding.

## 2016-12-28 ENCOUNTER — Ambulatory Visit (INDEPENDENT_AMBULATORY_CARE_PROVIDER_SITE_OTHER): Payer: Medicare Other | Admitting: Urology

## 2016-12-28 ENCOUNTER — Encounter: Payer: Self-pay | Admitting: Urology

## 2016-12-28 VITALS — BP 147/81 | HR 89 | Ht 65.0 in | Wt 164.5 lb

## 2016-12-28 DIAGNOSIS — R3129 Other microscopic hematuria: Secondary | ICD-10-CM | POA: Diagnosis not present

## 2016-12-28 LAB — URINALYSIS, COMPLETE
Bilirubin, UA: NEGATIVE
Glucose, UA: NEGATIVE
Ketones, UA: NEGATIVE
Leukocytes, UA: NEGATIVE
NITRITE UA: NEGATIVE
PH UA: 5.5 (ref 5.0–7.5)
Protein, UA: NEGATIVE
Specific Gravity, UA: >1.03 — ABNORMAL HIGH (ref 1.005–1.030)
Urobilinogen, Ur: 0.2 mg/dL (ref 0.2–1.0)

## 2016-12-28 LAB — MICROSCOPIC EXAMINATION: Bacteria, UA: NONE SEEN

## 2016-12-28 MED ORDER — LIDOCAINE HCL 2 % EX GEL
1.0000 "application " | Freq: Once | CUTANEOUS | Status: AC
  Administered 2016-12-28: 1 via URETHRAL

## 2016-12-28 MED ORDER — CIPROFLOXACIN HCL 500 MG PO TABS
500.0000 mg | ORAL_TABLET | Freq: Once | ORAL | Status: AC
  Administered 2016-12-28: 500 mg via ORAL

## 2016-12-28 NOTE — Progress Notes (Signed)
   12/28/16  CC:  Chief Complaint  Patient presents with  . Cysto    microscopic hematuria     HPI: F/u MH for cystoscopy. She was seen Apr 2018 for microhematuria. UA on 11/27/2016 showed large blood on dip. Ua at [facility] showed 3-10 rbc / hpf. She's had no gross hematuria. No dysuria, flank pain or stone passage.   She had Retuxan for thrombocytopenia in 2015. No chemo or XRT. She is a smoker.   Abd [place] Nov 2017 showed normal kidneys, no mass or hydro. She had lithotripsy for a left distal stone in 2016.   She is well today. She underwent CT hematuria. I reviewed the images. CT benign. Liver cirrhosis present (known history).   There were no vitals taken for this visit. Chaperone - Kalita  NED. A&Ox3.   No respiratory distress   Abd soft, NT, ND Normal external genitalia with patent urethral meatus Bladder and urethra palpably normal.   Cystoscopy Procedure Note  Patient identification was confirmed, informed consent was obtained, and patient was prepped using Betadine solution.  Lidocaine jelly was administered per urethral meatus.    Preoperative abx where taken by the patient -- she's on NF from her PCP.   Procedure: - Flexible cystoscope introduced, without any difficulty.   - Thorough search of the bladder revealed:    normal urethral meatus    normal urothelium    no stones    no ulcers     no tumors    no urethral polyps    no trabeculation  - Ureteral orifices were normal in position and appearance.  Post-Procedure: - Patient tolerated the procedure well  Assessment/ Plan:  -f/u 1 year per [facility] guidelines for UA and symptom check or sooner if any issues arise.    Festus Aloe, MD

## 2017-02-01 DIAGNOSIS — F17210 Nicotine dependence, cigarettes, uncomplicated: Secondary | ICD-10-CM | POA: Diagnosis not present

## 2017-02-23 DIAGNOSIS — D045 Carcinoma in situ of skin of trunk: Secondary | ICD-10-CM | POA: Diagnosis not present

## 2017-02-23 DIAGNOSIS — D225 Melanocytic nevi of trunk: Secondary | ICD-10-CM | POA: Diagnosis not present

## 2017-02-23 DIAGNOSIS — D485 Neoplasm of uncertain behavior of skin: Secondary | ICD-10-CM | POA: Diagnosis not present

## 2017-02-23 DIAGNOSIS — X32XXXA Exposure to sunlight, initial encounter: Secondary | ICD-10-CM | POA: Diagnosis not present

## 2017-02-23 DIAGNOSIS — L814 Other melanin hyperpigmentation: Secondary | ICD-10-CM | POA: Diagnosis not present

## 2017-02-23 DIAGNOSIS — D2261 Melanocytic nevi of right upper limb, including shoulder: Secondary | ICD-10-CM | POA: Diagnosis not present

## 2017-03-20 ENCOUNTER — Ambulatory Visit: Payer: Medicaid Other | Admitting: Oncology

## 2017-03-20 ENCOUNTER — Other Ambulatory Visit: Payer: Medicaid Other

## 2017-03-27 ENCOUNTER — Other Ambulatory Visit: Payer: Self-pay

## 2017-03-27 ENCOUNTER — Other Ambulatory Visit: Payer: Medicare HMO

## 2017-03-27 ENCOUNTER — Inpatient Hospital Stay: Payer: Medicare Other

## 2017-03-27 ENCOUNTER — Encounter: Payer: Self-pay | Admitting: Oncology

## 2017-03-27 ENCOUNTER — Inpatient Hospital Stay: Payer: Medicare Other | Attending: Oncology | Admitting: Oncology

## 2017-03-27 ENCOUNTER — Ambulatory Visit: Payer: Self-pay | Admitting: Oncology

## 2017-03-27 VITALS — BP 145/91 | HR 71 | Temp 97.1°F | Wt 165.5 lb

## 2017-03-27 DIAGNOSIS — Z87442 Personal history of urinary calculi: Secondary | ICD-10-CM | POA: Diagnosis not present

## 2017-03-27 DIAGNOSIS — Z79899 Other long term (current) drug therapy: Secondary | ICD-10-CM | POA: Insufficient documentation

## 2017-03-27 DIAGNOSIS — R319 Hematuria, unspecified: Secondary | ICD-10-CM | POA: Diagnosis not present

## 2017-03-27 DIAGNOSIS — D693 Immune thrombocytopenic purpura: Secondary | ICD-10-CM

## 2017-03-27 DIAGNOSIS — Z87891 Personal history of nicotine dependence: Secondary | ICD-10-CM | POA: Insufficient documentation

## 2017-03-27 DIAGNOSIS — Z809 Family history of malignant neoplasm, unspecified: Secondary | ICD-10-CM | POA: Diagnosis not present

## 2017-03-27 DIAGNOSIS — Z96659 Presence of unspecified artificial knee joint: Secondary | ICD-10-CM | POA: Insufficient documentation

## 2017-03-27 DIAGNOSIS — I10 Essential (primary) hypertension: Secondary | ICD-10-CM

## 2017-03-27 DIAGNOSIS — M1990 Unspecified osteoarthritis, unspecified site: Secondary | ICD-10-CM | POA: Insufficient documentation

## 2017-03-27 DIAGNOSIS — D696 Thrombocytopenia, unspecified: Secondary | ICD-10-CM

## 2017-03-27 DIAGNOSIS — Z8619 Personal history of other infectious and parasitic diseases: Secondary | ICD-10-CM | POA: Diagnosis not present

## 2017-03-27 LAB — CBC WITH DIFFERENTIAL/PLATELET
Basophils Absolute: 0 10*3/uL (ref 0–0.1)
Basophils Relative: 0 %
EOS PCT: 2 %
Eosinophils Absolute: 0.1 10*3/uL (ref 0–0.7)
HCT: 40.9 % (ref 35.0–47.0)
Hemoglobin: 14.5 g/dL (ref 12.0–16.0)
LYMPHS ABS: 2.4 10*3/uL (ref 1.0–3.6)
Lymphocytes Relative: 35 %
MCH: 33.3 pg (ref 26.0–34.0)
MCHC: 35.6 g/dL (ref 32.0–36.0)
MCV: 93.7 fL (ref 80.0–100.0)
MONOS PCT: 9 %
Monocytes Absolute: 0.6 10*3/uL (ref 0.2–0.9)
Neutro Abs: 3.7 10*3/uL (ref 1.4–6.5)
Neutrophils Relative %: 54 %
PLATELETS: 117 10*3/uL — AB (ref 150–440)
RBC: 4.36 MIL/uL (ref 3.80–5.20)
RDW: 12.6 % (ref 11.5–14.5)
WBC: 6.8 10*3/uL (ref 3.6–11.0)

## 2017-03-27 NOTE — Progress Notes (Signed)
[facility]  [phone] [phone]  ID: Criss Alvine OB: 02/13/1956  MR#: [id number]  TDV#:[id number]  Patient Care Team: Lavera Guise, MD as PCP - General (Internal Medicine)  CHIEF COMPLAINT: Autoimmune thrombocytopenia.  INTERVAL HISTORY: Patient returns to clinic today for laboratory work and further evaluation. She was seen by [facility] urology for hematuria in April 2018. A cystoscopy was performed and she was negative for a stone. She denies any further hematuria. She continues to feel well and is asymptomatic. She denies any easy bleeding or bruising. She has no neurologic complaints. She denies any recent fevers or illnesses. She has a good appetite and denies weight loss. She has no chest pain or shortness of breath. She denies any nausea, vomiting, constipation, or diarrhea. She denies melena or hematochezia.  She has no urinary complaints. Patient offers no specific complaints today.  REVIEW OF SYSTEMS:   Review of Systems  Constitutional: Negative.   Respiratory: Negative.  Negative for sputum production.   Cardiovascular: Negative.  Negative for chest pain.  Gastrointestinal: Negative.  Negative for abdominal pain, blood in stool and melena.  Musculoskeletal: Negative.   Neurological: Negative.   Endo/Heme/Allergies: Does not bruise/bleed easily.    As per HPI. Otherwise, a complete review of systems is negatve.  PAST MEDICAL HISTORY: Past Medical History:  Diagnosis Date  . Hepatitis C   . Kidney stone   . Osteoarthritis   . Sepsis due to urinary tract infection ([place]) 2014    PAST SURGICAL HISTORY: Past Surgical History:  Procedure Laterality Date  . COLONOSCOPY WITH PROPOFOL N/A 10/04/2016   Procedure: COLONOSCOPY WITH PROPOFOL;  Surgeon: Lollie Sails, MD;  Location[facility] ENDOSCOPY;  Service: Endoscopy;  Laterality: N/A;  . ESOPHAGOGASTRODUODENOSCOPY (EGD) WITH PROPOFOL N/A 05/13/2016   Procedure:  ESOPHAGOGASTRODUODENOSCOPY (EGD) WITH PROPOFOL;  Surgeon: Lollie Sails, MD;  Location: [facility] ENDOSCOPY;  Service: Endoscopy;  Laterality: N/A;  . JOINT REPLACEMENT Left 2015  . TONSILLECTOMY    . TONSILLECTOMY    . TOTAL KNEE ARTHROPLASTY      FAMILY HISTORY Family History  Problem Relation Age of Onset  . Liver cancer Father        ADVANCED DIRECTIVES:    HEALTH MAINTENANCE: Social History  Substance Use Topics  . Smoking status: Former Smoker    Types: Cigarettes  . Smokeless tobacco: Never Used  . Alcohol use 0.6 oz/week    1 Glasses of wine per week     Comment: occasionally last week     Colonoscopy:  PAP:  Bone density:  Lipid panel:  Allergies  Allergen Reactions  . Aspirin Nausea And Vomiting  . Nsaids Other (See Comments)    Liver function/plateles    Current Outpatient Prescriptions  Medication Sig Dispense Refill  . nitrofurantoin (MACRODANTIN) 100 MG capsule Take 100 mg by mouth 4 (four) times daily.    . fluorouracil (EFUDEX) 5 % cream     . lisinopril (PRINIVIL,ZESTRIL) 5 MG tablet Take 5 mg by mouth daily.    . pantoprazole (PROTONIX) 40 MG tablet     . RABEprazole (ACIPHEX) 20 MG tablet Take 20 mg by mouth daily.    . traMADol (ULTRAM) 50 MG tablet      No current facility-administered medications for this visit.     OBJECTIVE: There were no vitals filed for this visit.   There is no height or weight on file to calculate BMI.    ECOG FS:0 - Asymptomatic  General: Well-developed,  well-nourished, no acute distress. Eyes: anicteric sclera. Lungs: Clear to auscultation bilaterally. Heart: Regular rate and rhythm. No rubs, murmurs, or gallops. Abdomen: Soft, nontender, nondistended. No organomegaly noted, normoactive bowel sounds. Musculoskeletal: No edema, cyanosis, or clubbing. Neuro: Alert, answering all questions appropriately. Cranial nerves grossly intact. Skin: No rashes or petechiae noted. Psych: Normal affect.   LAB  RESULTS:  Lab Results  Component Value Date   NA 137 07/03/2016   K 4.1 07/03/2016   CL 103 07/03/2016   CO2 27 07/03/2016   GLUCOSE 122 (H) 07/03/2016   BUN 18 07/03/2016   CREATININE 0.59 07/03/2016   CALCIUM 9.6 07/03/2016   PROT 7.6 02/16/2015   ALBUMIN 4.6 02/16/2015   AST 29 02/16/2015   ALT 30 02/16/2015   ALKPHOS 110 02/16/2015   BILITOT 0.4 02/16/2015   GFRNONAA >60 07/03/2016   GFRAA >60 07/03/2016    Lab Results  Component Value Date   WBC 6.8 03/27/2017   NEUTROABS 3.7 03/27/2017   HGB 14.5 03/27/2017   HCT 40.9 03/27/2017   MCV 93.7 03/27/2017   PLT 117 (L) 03/27/2017     STUDIES: No results found.  ASSESSMENT: Autoimmune thrombocytopenia.  PLAN:    1. Autoimmune thrombocytopenia: Patient's platelet antibodies are positive.  She also had a positive ANA, which was evaluated by rheumatology and determined to be clinically insignificant.  Patient completed 4 cycles of weekly Rituxan on Jan 17, 2014. The remainder of her laboratory work was either negative or within normal limits. She does not have splenomegaly. Her platelets continue to be decreased, but approximately her baseline. No intervention is needed at this time. Return to clinic in 6 months for laboratory work only and then in one year for laboratory work and further evaluation. Patient understands she can return to clinic at any time if she has any questions, concerns, or complaints. 2. Knee replacement: Completed. Further treatment per orthopedics.  3. Hypertension: Patient's blood pressure mildly elevated today. Continue evaluation and treatment per primary care. 4. Hematuria: Continue follow-up as needed with [place].  Patient expressed understanding and was in agreement with this plan. She also understands that She can call clinic at any time with any questions, concerns, or complaints.    Jacquelin Hawking, NP   03/27/2017 2:49 PM

## 2017-04-10 ENCOUNTER — Emergency Department
Admission: EM | Admit: 2017-04-10 | Discharge: 2017-04-10 | Disposition: A | Discharge status: Home / Self Care | Payer: Medicare Other | Attending: Emergency Medicine | Admitting: Emergency Medicine

## 2017-04-10 DIAGNOSIS — R110 Nausea: Secondary | ICD-10-CM | POA: Diagnosis not present

## 2017-04-10 DIAGNOSIS — Z5321 Procedure and treatment not carried out due to patient leaving prior to being seen by health care provider: Secondary | ICD-10-CM | POA: Insufficient documentation

## 2017-04-10 HISTORY — DX: Essential (primary) hypertension: I10

## 2017-04-10 HISTORY — DX: Malignant melanoma of skin, unspecified: C43.9

## 2017-04-10 LAB — CBC
HCT: 42.7 % (ref 35.0–47.0)
Hemoglobin: 15 g/dL (ref 12.0–16.0)
MCH: 33.4 pg (ref 26.0–34.0)
MCHC: 35.1 g/dL (ref 32.0–36.0)
MCV: 95.3 fL (ref 80.0–100.0)
PLATELETS: 124 10*3/uL — AB (ref 150–440)
RBC: 4.48 MIL/uL (ref 3.80–5.20)
RDW: 12.5 % (ref 11.5–14.5)
WBC: 7 10*3/uL (ref 3.6–11.0)

## 2017-04-10 LAB — URINALYSIS, COMPLETE (UACMP) WITH MICROSCOPIC
Bacteria, UA: NONE SEEN
Bilirubin Urine: NEGATIVE
GLUCOSE, UA: NEGATIVE mg/dL
Ketones, ur: NEGATIVE mg/dL
Leukocytes, UA: NEGATIVE
Nitrite: NEGATIVE
PH: 5 (ref 5.0–8.0)
Protein, ur: NEGATIVE mg/dL
SPECIFIC GRAVITY, URINE: 1.026 (ref 1.005–1.030)

## 2017-04-10 LAB — COMPREHENSIVE METABOLIC PANEL
ALK PHOS: 86 U/L (ref 38–126)
ALT: 28 U/L (ref 14–54)
AST: 31 U/L (ref 15–41)
Albumin: 4.6 g/dL (ref 3.5–5.0)
Anion gap: 10 (ref 5–15)
BUN: 24 mg/dL — AB (ref 6–20)
CALCIUM: 9.8 mg/dL (ref 8.9–10.3)
CO2: 24 mmol/L (ref 22–32)
CREATININE: 0.74 mg/dL (ref 0.44–1.00)
Chloride: 105 mmol/L (ref 101–111)
GFR calc non Af Amer: >60 mL/min (ref >60)
GLUCOSE: 145 mg/dL — AB (ref 65–99)
Potassium: 4.3 mmol/L (ref 3.5–5.1)
SODIUM: 139 mmol/L (ref 135–145)
Total Bilirubin: 0.7 mg/dL (ref 0.3–1.2)
Total Protein: 7.7 g/dL (ref 6.5–8.1)

## 2017-04-10 LAB — LIPASE, BLOOD: Lipase: 27 U/L (ref 11–51)

## 2017-04-10 NOTE — ED Notes (Signed)
Patient came up to the desk and stated that she was leaving, but wasn't sure yet. Patient states she will follow up with her doctor. Patient denies nausea.

## 2017-04-10 NOTE — ED Notes (Signed)
Patient states nausea has resolved

## 2017-04-10 NOTE — ED Triage Notes (Addendum)
Pt presents to ED with c/o of nausea and lightheadedness that began this morning. Pt states she feels shaky and hands are tingling, just "feels off" denies pain. visitor with pt states BP was 614 systolic at home. Ambulatory, alert and oriented.

## 2017-04-10 NOTE — ED Notes (Addendum)
Patient came up to the desk and stated that she was leaving, but wasn't sure yet. Patient states she will follow up with her doctor. Patient denies nausea. This note was charted in error under [place] name.

## 2017-04-13 DIAGNOSIS — M1380 Other specified arthritis, unspecified site: Secondary | ICD-10-CM | POA: Diagnosis not present

## 2017-04-13 DIAGNOSIS — Z0001 Encounter for general adult medical examination with abnormal findings: Secondary | ICD-10-CM | POA: Diagnosis not present

## 2017-04-13 DIAGNOSIS — I10 Essential (primary) hypertension: Secondary | ICD-10-CM | POA: Diagnosis not present

## 2017-04-13 DIAGNOSIS — F17210 Nicotine dependence, cigarettes, uncomplicated: Secondary | ICD-10-CM | POA: Diagnosis not present

## 2017-05-04 DIAGNOSIS — I10 Essential (primary) hypertension: Secondary | ICD-10-CM | POA: Diagnosis not present

## 2017-05-04 DIAGNOSIS — R079 Chest pain, unspecified: Secondary | ICD-10-CM | POA: Diagnosis not present

## 2017-05-04 DIAGNOSIS — F17210 Nicotine dependence, cigarettes, uncomplicated: Secondary | ICD-10-CM | POA: Diagnosis not present

## 2017-05-08 ENCOUNTER — Other Ambulatory Visit: Payer: Self-pay | Admitting: Nurse Practitioner

## 2017-05-08 ENCOUNTER — Ambulatory Visit
Admission: RE | Admit: 2017-05-08 | Discharge: 2017-05-08 | Disposition: A | Discharge status: Home / Self Care | Payer: Medicare Other | Source: Ambulatory Visit | Attending: Nurse Practitioner | Admitting: Nurse Practitioner

## 2017-05-08 DIAGNOSIS — R52 Pain, unspecified: Secondary | ICD-10-CM

## 2017-05-08 DIAGNOSIS — R079 Chest pain, unspecified: Secondary | ICD-10-CM | POA: Diagnosis not present

## 2017-05-10 DIAGNOSIS — I10 Essential (primary) hypertension: Secondary | ICD-10-CM | POA: Diagnosis not present

## 2017-05-10 DIAGNOSIS — R079 Chest pain, unspecified: Secondary | ICD-10-CM | POA: Diagnosis not present

## 2017-06-05 DIAGNOSIS — D0472 Carcinoma in situ of skin of left lower limb, including hip: Secondary | ICD-10-CM | POA: Diagnosis not present

## 2017-06-05 DIAGNOSIS — D485 Neoplasm of uncertain behavior of skin: Secondary | ICD-10-CM | POA: Diagnosis not present

## 2017-06-05 DIAGNOSIS — Z08 Encounter for follow-up examination after completed treatment for malignant neoplasm: Secondary | ICD-10-CM | POA: Diagnosis not present

## 2017-06-05 DIAGNOSIS — Z85828 Personal history of other malignant neoplasm of skin: Secondary | ICD-10-CM | POA: Diagnosis not present

## 2017-06-21 DIAGNOSIS — Z23 Encounter for immunization: Secondary | ICD-10-CM | POA: Diagnosis not present

## 2017-07-10 DIAGNOSIS — H2513 Age-related nuclear cataract, bilateral: Secondary | ICD-10-CM | POA: Diagnosis not present

## 2017-07-27 ENCOUNTER — Other Ambulatory Visit: Payer: Self-pay | Admitting: Gastroenterology

## 2017-07-27 DIAGNOSIS — K7460 Unspecified cirrhosis of liver: Secondary | ICD-10-CM | POA: Diagnosis not present

## 2017-08-02 ENCOUNTER — Other Ambulatory Visit: Payer: Self-pay | Admitting: Gastroenterology

## 2017-08-02 ENCOUNTER — Ambulatory Visit: Payer: Medicare Other

## 2017-08-02 DIAGNOSIS — K7460 Unspecified cirrhosis of liver: Secondary | ICD-10-CM

## 2017-08-03 ENCOUNTER — Ambulatory Visit
Admission: RE | Admit: 2017-08-03 | Discharge: 2017-08-03 | Disposition: A | Discharge status: Home / Self Care | Payer: Medicare Other | Source: Ambulatory Visit | Attending: Gastroenterology | Admitting: Gastroenterology

## 2017-08-03 DIAGNOSIS — K7460 Unspecified cirrhosis of liver: Secondary | ICD-10-CM | POA: Diagnosis not present

## 2017-08-11 ENCOUNTER — Ambulatory Visit: Payer: Self-pay | Admitting: Nurse Practitioner

## 2017-09-18 ENCOUNTER — Other Ambulatory Visit: Payer: Self-pay

## 2017-09-19 ENCOUNTER — Other Ambulatory Visit: Payer: Self-pay | Admitting: Nurse Practitioner

## 2017-09-19 ENCOUNTER — Ambulatory Visit: Payer: Self-pay | Admitting: Nurse Practitioner

## 2017-09-19 DIAGNOSIS — G8929 Other chronic pain: Secondary | ICD-10-CM

## 2017-09-19 DIAGNOSIS — M5442 Lumbago with sciatica, left side: Principal | ICD-10-CM

## 2017-09-19 DIAGNOSIS — M5441 Lumbago with sciatica, right side: Principal | ICD-10-CM

## 2017-09-19 MED ORDER — TRAMADOL HCL 50 MG PO TABS: 100.0000 mg | ORAL_TABLET | Freq: Two times a day (BID) | ORAL | 3 refills | Status: DC

## 2017-09-20 ENCOUNTER — Other Ambulatory Visit: Payer: Self-pay

## 2017-09-20 MED ORDER — LISINOPRIL-HYDROCHLOROTHIAZIDE 10-12.5 MG PO TABS: ORAL_TABLET | ORAL | 5 refills | Status: DC

## 2017-09-21 ENCOUNTER — Ambulatory Visit: Payer: Self-pay | Admitting: Nurse Practitioner

## 2017-09-22 ENCOUNTER — Encounter: Payer: Self-pay | Admitting: Nurse Practitioner

## 2017-09-22 ENCOUNTER — Ambulatory Visit: Payer: Medicare Other | Admitting: Nurse Practitioner

## 2017-09-22 VITALS — BP 158/98 | HR 90 | Resp 16 | Ht 65.0 in | Wt 172.4 lb

## 2017-09-22 DIAGNOSIS — G8929 Other chronic pain: Secondary | ICD-10-CM | POA: Diagnosis not present

## 2017-09-22 DIAGNOSIS — M5440 Lumbago with sciatica, unspecified side: Secondary | ICD-10-CM

## 2017-09-22 DIAGNOSIS — K644 Residual hemorrhoidal skin tags: Secondary | ICD-10-CM

## 2017-09-22 DIAGNOSIS — R635 Abnormal weight gain: Secondary | ICD-10-CM

## 2017-09-22 DIAGNOSIS — I10 Essential (primary) hypertension: Secondary | ICD-10-CM | POA: Diagnosis not present

## 2017-09-22 DIAGNOSIS — J019 Acute sinusitis, unspecified: Secondary | ICD-10-CM

## 2017-09-22 MED ORDER — LISINOPRIL-HYDROCHLOROTHIAZIDE 10-12.5 MG PO TABS
ORAL_TABLET | ORAL | 5 refills | Status: DC
Start: 2017-09-22 — End: 2018-09-07

## 2017-09-22 MED ORDER — AMOXICILLIN-POT CLAVULANATE 875-125 MG PO TABS: 1.0000 | ORAL_TABLET | Freq: Two times a day (BID) | ORAL | 0 refills | Status: DC

## 2017-09-22 NOTE — Progress Notes (Signed)
[facility] [place], [place] [address]  Internal MEDICINE  Office Visit Note  Patient Name: Angie [name]  [id number]  [id number]  Date of Service: 10/04/2017  Chief Complaint  Patient presents with  . Weight Loss    wants to do something for her weight  . Hemorrhoids  . Sinus Problem    green mucous, sinus pressure    The patient is here for routine follow up exam. She states that she has a large hemorrhoid. She has noted some significatn bleeding associated with the hemorrhoid. Has used multiple OTC treatments for it. Inflammation with improve, but always comes right back. She states that bleeding can be so severe, that looks like menstrual cycle.    Sinus Problem  This is a new problem. The current episode started more than 1 month ago. The problem has been waxing and waning since onset. There has been no fever. Associated symptoms include congestion, coughing, ear pain, headaches, sinus pressure and a sore throat. Pertinent negatives include no shortness of breath. Treatments tried: otc cough and cold medication. The treatment provided mild relief.    Pt is here for routine follow up.    Current Medication: Outpatient Encounter Medications as of 09/22/2017  Medication Sig  . cyclobenzaprine (FLEXERIL) 10 MG tablet Take 10 mg by mouth as needed for muscle spasms.  Marland Kitchen lisinopril-hydrochlorothiazide (PRINZIDE,ZESTORETIC) 10-12.5 MG tablet Take 1 tab po daily  . traMADol (ULTRAM) 50 MG tablet Take 2 tablets (100 mg total) by mouth 2 (two) times daily.  . [DISCONTINUED] lisinopril-hydrochlorothiazide (PRINZIDE,ZESTORETIC) 10-12.5 MG tablet Take 1 tab po daily  . amoxicillin-clavulanate (AUGMENTIN) 875-125 MG tablet Take 1 tablet by mouth 2 (two) times daily.  . fluorouracil (EFUDEX) 5 % cream   . pantoprazole (PROTONIX) 40 MG tablet    No facility-administered encounter medications on file as of 09/22/2017.     Surgical History: Past Surgical History:   Procedure Laterality Date  . COLONOSCOPY WITH PROPOFOL N/A 10/04/2016   Procedure: COLONOSCOPY WITH PROPOFOL;  Surgeon: Lollie Sails, MD;  Location: [facility] ENDOSCOPY;  Service: Endoscopy;  Laterality: N/A;  . ESOPHAGOGASTRODUODENOSCOPY (EGD) WITH PROPOFOL N/A 05/13/2016   Procedure: ESOPHAGOGASTRODUODENOSCOPY (EGD) WITH PROPOFOL;  Surgeon: Lollie Sails, MD;  Location: [facility] ENDOSCOPY;  Service: Endoscopy;  Laterality: N/A;  . JOINT REPLACEMENT Left 2015  . TONSILLECTOMY    . TONSILLECTOMY    . TOTAL KNEE ARTHROPLASTY      Medical History: Past Medical History:  Diagnosis Date  . Hepatitis C   . Hypertension   . Kidney stone   . Melanoma ([place])   . Osteoarthritis   . Sepsis due to urinary tract infection ([place]) 2014    Family History: Family History  Problem Relation Age of Onset  . Liver cancer Father   . Cancer Father   . COPD Father   . Heart disease Father   . COPD Mother   . Hypertension Mother   . Diabetes Sister   . Diabetes Paternal Grandmother   . Heart disease Paternal Grandmother   . Hypertension Paternal Grandmother   . Diabetes Paternal Grandfather   . Heart disease Paternal Grandfather   . Hypertension Paternal Grandfather     Social History   Socioeconomic History  . Marital status: Single    Spouse name: Not on file  . Number of children: Not on file  . Years of education: Not on file  . Highest education level: Not on file  Social Needs  .  Financial resource strain: Not on file  . Food insecurity - worry: Not on file  . Food insecurity - inability: Not on file  . Transportation needs - medical: Not on file  . Transportation needs - non-medical: Not on file  Occupational History  . Not on file  Tobacco Use  . Smoking status: Current Some Day Smoker    Types: Cigarettes  . Smokeless tobacco: Never Used  Substance and Sexual Activity  . Alcohol use: Yes    Alcohol/week: 0.6 oz    Types: 1 Glasses of wine per week    Comment:  occasionally last week  . Drug use: No  . Sexual activity: Yes    Birth control/protection: None, Post-menopausal  Other Topics Concern  . Not on file  Social History Narrative  . Not on file      Review of Systems  Constitutional: Positive for fatigue, fever and unexpected weight change. Negative for activity change and appetite change.  HENT: Positive for congestion, ear pain, sinus pressure, sore throat and voice change.   Eyes: Negative.   Respiratory: Positive for cough and wheezing. Negative for shortness of breath.   Cardiovascular: Negative for chest pain and palpitations.  Gastrointestinal: Positive for nausea. Negative for abdominal distention, constipation, diarrhea and vomiting.       Significant hemorrhoid.  Endocrine: Negative for cold intolerance, heat intolerance, polydipsia, polyphagia and polyuria.  Genitourinary:       Significant hemorrhoid, getting worse. Hard to sit. Bleeding often, close to menstrual bleeding in severity.   Musculoskeletal: Positive for arthralgias and back pain.  Skin: Negative for color change, pallor, rash and wound.  Allergic/Immunologic: Positive for environmental allergies. Negative for food allergies and immunocompromised state.  Neurological: Positive for weakness and headaches.  Hematological: Positive for adenopathy.  Psychiatric/Behavioral: Negative for behavioral problems. The patient is not nervous/anxious.     Vital Signs: Pulse 90   Resp 16   Ht 5\' 5"  (1.651 m)   Wt 172 lb 6.4 oz (78.2 kg)   SpO2 94%   BMI 28.69 kg/m    Physical Exam  Constitutional: She is oriented to person, place, and time. She appears well-developed and well-nourished.  HENT:  Head: Normocephalic.  Right Ear: Tympanic membrane is bulging.  Left Ear: Tympanic membrane is bulging.  Nose: Rhinorrhea present. Right sinus exhibits frontal sinus tenderness. Left sinus exhibits frontal sinus tenderness.  Mouth/Throat: Posterior oropharyngeal edema  present.  Eyes: Pupils are equal, round, and reactive to light.  Neck: Normal range of motion. Neck supple.  Cardiovascular: Normal rate, regular rhythm and normal heart sounds.  Pulmonary/Chest: Effort normal and breath sounds normal. She has no wheezes.  Congested, non-productive cough   Abdominal: Soft. Bowel sounds are normal. There is no tenderness.  Genitourinary: Rectal exam shows external hemorrhoid and tenderness.    Pelvic exam was performed with patient in the knee-chest position.  Musculoskeletal: Normal range of motion.  Chronic lower back pain which can be worse with bending and twisting at the waist. No visible or papable abnormalities or deformities noted at theis time.   Lymphadenopathy:    She has cervical adenopathy.  Neurological: She is alert and oriented to person, place, and time.  Skin: Skin is warm and dry.  Psychiatric: She has a normal mood and affect. Her behavior is normal. Judgment and thought content normal.  Nursing note and vitals reviewed.  Assessment/Plan:  1. Acute non-recurrent sinusitis, unspecified location - amoxicillin-clavulanate (AUGMENTIN) 875-125 MG tablet; Take 1 tablet by mouth  2 (two) times daily.  Dispense: 20 tablet; Refill: 0 Recommend use of OTC mucinex to help with congestion. Delsym twice daily as needed and as indicated for cough.   2. Essential hypertension Generally stable. Continue bp medication as prescribe.  - lisinopril-hydrochlorothiazide (PRINZIDE,ZESTORETIC) 10-12.5 MG tablet; Take 1 tab po daily  Dispense: 30 tablet; Refill: 5  3. Bleeding external hemorrhoids - Ambulatory referral to General Surgery  4. Abnormal weight gain Check labs including thyroid panel for further evaluation.   5. Chronic low back pain with sciatica, sciatica laterality unspecified, unspecified back pain laterality May continue tramadol as needed and as prsecribed.    General Counseling: nijah tejera understanding of the findings of  todays visit and agrees with plan of treatment. I have discussed any further diagnostic evaluation that may be needed or ordered today. We also reviewed her medications today. she has been encouraged to call the office with any questions or concerns that should arise related to todays visit.  This patient was seen by Leretha Pol, FNP- C in Collaboration with Dr Lavera Guise as a part of collaborative care agreement    Orders Placed This Encounter  Procedures  . Ambulatory referral to General Surgery    Meds ordered this encounter  Medications  . lisinopril-hydrochlorothiazide (PRINZIDE,ZESTORETIC) 10-12.5 MG tablet    Sig: Take 1 tab po daily    Dispense:  30 tablet    Refill:  5    Order Specific Question:   Supervising Provider    Answer:   Lavera Guise Gerald  . amoxicillin-clavulanate (AUGMENTIN) 875-125 MG tablet    Sig: Take 1 tablet by mouth 2 (two) times daily.    Dispense:  20 tablet    Refill:  0    Order Specific Question:   Supervising Provider    Answer:   Lavera Guise [[id number]]    Time spent: 2 Minutes   Dr Lavera Guise Internal medicine

## 2017-09-25 ENCOUNTER — Inpatient Hospital Stay: Payer: Medicare Other | Attending: Oncology

## 2017-09-25 ENCOUNTER — Other Ambulatory Visit: Payer: Self-pay | Admitting: *Deleted

## 2017-09-25 ENCOUNTER — Encounter: Payer: Self-pay | Admitting: *Deleted

## 2017-09-25 DIAGNOSIS — D693 Immune thrombocytopenic purpura: Secondary | ICD-10-CM | POA: Diagnosis not present

## 2017-09-25 DIAGNOSIS — D696 Thrombocytopenia, unspecified: Secondary | ICD-10-CM

## 2017-09-25 LAB — CBC WITH DIFFERENTIAL/PLATELET
BASOS ABS: 0 10*3/uL (ref 0–0.1)
BASOS PCT: 1 %
EOS ABS: 0.2 10*3/uL (ref 0–0.7)
Eosinophils Relative: 2 %
HCT: 43 % (ref 35.0–47.0)
Hemoglobin: 14.7 g/dL (ref 12.0–16.0)
Lymphocytes Relative: 40 %
Lymphs Abs: 2.7 10*3/uL (ref 1.0–3.6)
MCH: 32.3 pg (ref 26.0–34.0)
MCHC: 34.3 g/dL (ref 32.0–36.0)
MCV: 94.3 fL (ref 80.0–100.0)
MONO ABS: 0.7 10*3/uL (ref 0.2–0.9)
MONOS PCT: 10 %
NEUTROS ABS: 3.3 10*3/uL (ref 1.4–6.5)
Neutrophils Relative %: 47 %
PLATELETS: 145 10*3/uL — AB (ref 150–440)
RBC: 4.56 MIL/uL (ref 3.80–5.20)
RDW: 12.3 % (ref 11.5–14.5)
WBC: 6.8 10*3/uL (ref 3.6–11.0)

## 2017-10-04 DIAGNOSIS — M5440 Lumbago with sciatica, unspecified side: Secondary | ICD-10-CM

## 2017-10-04 DIAGNOSIS — R635 Abnormal weight gain: Secondary | ICD-10-CM | POA: Insufficient documentation

## 2017-10-04 DIAGNOSIS — M5441 Lumbago with sciatica, right side: Secondary | ICD-10-CM | POA: Insufficient documentation

## 2017-10-04 DIAGNOSIS — K644 Residual hemorrhoidal skin tags: Secondary | ICD-10-CM | POA: Insufficient documentation

## 2017-10-04 DIAGNOSIS — J019 Acute sinusitis, unspecified: Secondary | ICD-10-CM | POA: Insufficient documentation

## 2017-10-04 DIAGNOSIS — I10 Essential (primary) hypertension: Secondary | ICD-10-CM | POA: Insufficient documentation

## 2017-10-04 DIAGNOSIS — G8929 Other chronic pain: Secondary | ICD-10-CM | POA: Insufficient documentation

## 2017-10-17 ENCOUNTER — Telehealth: Payer: Self-pay | Admitting: General Surgery

## 2017-10-17 ENCOUNTER — Ambulatory Visit (INDEPENDENT_AMBULATORY_CARE_PROVIDER_SITE_OTHER): Payer: Medicare Other | Admitting: General Surgery

## 2017-10-17 ENCOUNTER — Encounter: Payer: Self-pay | Admitting: General Surgery

## 2017-10-17 VITALS — BP 154/94 | HR 83 | Resp 14 | Ht 65.0 in | Wt 169.0 lb

## 2017-10-17 DIAGNOSIS — K644 Residual hemorrhoidal skin tags: Secondary | ICD-10-CM

## 2017-10-17 MED ORDER — HYDROCORTISONE 2.5 % RE CREA: 1.0000 "application " | TOPICAL_CREAM | Freq: Two times a day (BID) | RECTAL | 0 refills | Status: DC

## 2017-10-17 MED ORDER — HYDROCORTISONE ACETATE 25 MG RE SUPP: 25.0000 mg | Freq: Two times a day (BID) | RECTAL | 0 refills | Status: DC

## 2017-10-17 NOTE — Telephone Encounter (Signed)
New RX sent, pt aware

## 2017-10-17 NOTE — Patient Instructions (Addendum)
  Recommend Anusol suppositories twice a day for a week and continue activia Follow up as needed or is symptoms worsen To call in one month with progress report    Hemorrhoids Hemorrhoids are swollen veins in and around the rectum or anus. Hemorrhoids can cause pain, itching, or bleeding. Most of the time, they do not cause serious problems. They usually get better with diet changes, lifestyle changes, and other home treatments. Follow these instructions at home: Eating and drinking  Eat foods that have fiber, such as whole grains, beans, nuts, fruits, and vegetables. Ask your doctor about taking products that have added fiber (fibersupplements).  Drink enough fluid to keep your pee (urine) clear or pale yellow. For Pain and Swelling  Take a warm-water bath (sitz bath) for 20 minutes to ease pain. Do this 3-4 times a day.  If directed, put ice on the painful area. It may be helpful to use ice between your warm baths. ? Put ice in a plastic bag. ? Place a towel between your skin and the bag. ? Leave the ice on for 20 minutes, 2-3 times a day. General instructions  Take over-the-counter and prescription medicines only as told by your doctor. ? Medicated creams and medicines that are inserted into the anus (suppositories) may be used or applied as told.  Exercise often.  Go to the bathroom when you have the urge to poop (to have a bowel movement). Do not wait.  Avoid pushing too hard (straining) when you poop.  Keep the butt area dry and clean. Use wet toilet paper or moist paper towels.  Do not sit on the toilet for a long time. Contact a doctor if:  You have any of these: ? Pain and swelling that do not get better with treatment or medicine. ? Bleeding that will not stop. ? Trouble pooping or you cannot poop. ? Pain or swelling outside the area of the hemorrhoids. This information is not intended to replace advice given to you by your health care provider. Make sure you  discuss any questions you have with your health care provider. Document Released: 05/17/2008 Document Revised: 01/14/2016 Document Reviewed: 04/22/2015 Elsevier Interactive Patient Education  [employer].

## 2017-10-17 NOTE — Telephone Encounter (Signed)
I SPOKE TO Paullina THE RN & SHE HAD CALLED IN THE CREAM FOR THE PATIENT.SINCE THE INSURANCE WOULD NOT COVER THE SUPPOSITORIES. I CALLED THE PATIENT & RELAYED THE MESSAGE

## 2017-10-17 NOTE — Telephone Encounter (Signed)
CHECKING TO SEE IF A NEW PRESCRIPTION WAS CALLED IN TO [place]

## 2017-10-17 NOTE — Progress Notes (Signed)
Patient ID: Angie [name], female   DOB: September 30, 1955, 62 y.o.   MRN: [id number]  Chief Complaint  Patient presents with  . Hemorrhoids    HPI Angie [name] is a 62 y.o. female.  Here for evaluation of hemorrhoids referred by Juliette Alcide NP. She has been troubled by her hemoorhoids on and off for over a year. She notices bleeding, pain with swelling about 2-3 times a month. The pain is with the BM. She states that when she was drinking Activia yogurt with probiotics it seemed to be better. She is using cleaning wipes and preparation H to help.   HPI  Past Medical History:  Diagnosis Date  . Arthritis   . Hepatitis C 2014   treated with Harvoni  . Hypertension   . Kidney stone   . Melanoma ([place])   . Osteoarthritis   . Sepsis due to urinary tract infection ([place]) 2014  . Thrombocytopenia, primary ([facility])    Dr Grayland Ormond    Past Surgical History:  Procedure Laterality Date  . COLONOSCOPY WITH PROPOFOL N/A 10/04/2016   Procedure: COLONOSCOPY WITH PROPOFOL;  Surgeon: Lollie Sails, MD;  Location: [facility] ENDOSCOPY;  Service: Endoscopy;  Laterality: N/A;  . ESOPHAGOGASTRODUODENOSCOPY (EGD) WITH PROPOFOL N/A 05/13/2016   Procedure: ESOPHAGOGASTRODUODENOSCOPY (EGD) WITH PROPOFOL;  Surgeon: Lollie Sails, MD;  Location: [facility] ENDOSCOPY;  Service: Endoscopy;  Laterality: N/A;  . JOINT REPLACEMENT Left 2015  . TONSILLECTOMY    . TONSILLECTOMY    . TOTAL KNEE ARTHROPLASTY      Family History  Problem Relation Age of Onset  . Liver cancer Father   . Cancer Father   . COPD Father   . Heart disease Father   . COPD Mother   . Hypertension Mother   . Diabetes Sister   . Diabetes Paternal Grandmother   . Heart disease Paternal Grandmother   . Hypertension Paternal Grandmother   . Diabetes Paternal Grandfather   . Heart disease Paternal Grandfather   . Hypertension Paternal Grandfather     Social History Social History   Tobacco Use  . Smoking status: Current Some Day Smoker     Types: Cigarettes  . Smokeless tobacco: Never Used  Substance Use Topics  . Alcohol use: Yes    Alcohol/week: 0.6 oz    Types: 1 Glasses of wine per week    Comment: occasionally last week  . Drug use: No    Allergies  Allergen Reactions  . Aspirin Nausea And Vomiting  . Nsaids Other (See Comments)    Liver function/[place]    Current Outpatient Medications  Medication Sig Dispense Refill  . Biotin 1 MG CAPS Take 1,000 mcg by mouth daily.    . cyclobenzaprine (FLEXERIL) 10 MG tablet Take 10 mg by mouth as needed for muscle spasms.    Marland Kitchen lisinopril-hydrochlorothiazide (PRINZIDE,ZESTORETIC) 10-12.5 MG tablet Take 1 tab po daily 30 tablet 5  . pantoprazole (PROTONIX) 40 MG tablet     . traMADol (ULTRAM) 50 MG tablet Take 2 tablets (100 mg total) by mouth 2 (two) times daily. 120 tablet 3  . hydrocortisone (ANUSOL-HC) 25 MG suppository Place 1 suppository (25 mg total) rectally 2 (two) times daily. 12 suppository 0  . hydrocortisone (PROCTOSOL HC) 2.5 % rectal cream Place 1 application rectally 2 (two) times daily. 30 g 0   No current facility-administered medications for this visit.     Review of Systems Review of Systems  Constitutional: Negative.   Respiratory: Negative.   Cardiovascular:  Negative.   Gastrointestinal: Negative for constipation and diarrhea.    Blood pressure (!) 154/94, pulse 83, resp. rate 14, height 5\' 5"  (1.651 m), weight 169 lb (76.7 kg), SpO2 98 %.  Physical Exam Physical Exam  Constitutional: She is oriented to person, place, and time. She appears well-developed and well-nourished.  HENT:  Mouth/Throat: Oropharynx is clear and moist.  Eyes: Conjunctivae are normal. No scleral icterus.  Neck: Neck supple.  Cardiovascular: Normal rate, regular rhythm, normal heart sounds and intact distal pulses.  No lower leg edema  Pulmonary/Chest: Effort normal and breath sounds normal.  Abdominal: Soft. Normal appearance. There is no tenderness.   Genitourinary: Rectal exam shows external hemorrhoid and internal hemorrhoid.  Genitourinary Comments: Dominant hemorrhoid right anterior   Lymphadenopathy:    She has no cervical adenopathy.  Neurological: She is alert and oriented to person, place, and time.  Skin: Skin is warm and dry.  Psychiatric: Her behavior is normal.    Data Reviewed Anoscopy shows both internal and external hemorrhoids.  Focal swelling of the right anterior hemorrhoid without active bleeding.  Otherwise unremarkable anoscopy.  Normal sphincter tone.  Assessment    Symptomatic hemorrhoids, improved with the use of probiotics and activity. Focal swelling    Plan    Recommend Anusol suppositories twice a day for a week and continue [place]. Anusol suppositories not covered with insurance ($90) proctosol HC 2.5% cream RX sent for substitution. Follow up as needed or is symptoms worsen Patient will call in one month with progress report     HPI, Physical Exam, Assessment and Plan have been scribed under the direction and in the presence of Robert Bellow, MD. Karie Fetch, RN  I have completed the exam and reviewed the above documentation for accuracy and completeness.  I agree with the above.  Haematologist has been used and any errors in dictation or transcription are unintentional.  Hervey Ard, M.D., F.A.C.S.  Forest Gleason Byrnett 10/19/2017, 8:58 PM

## 2017-10-23 ENCOUNTER — Ambulatory Visit: Payer: Self-pay | Admitting: Nurse Practitioner

## 2017-11-17 ENCOUNTER — Ambulatory Visit: Payer: Self-pay | Admitting: Nurse Practitioner

## 2017-12-12 ENCOUNTER — Encounter: Payer: Self-pay | Admitting: Nurse Practitioner

## 2017-12-12 ENCOUNTER — Ambulatory Visit: Payer: Medicare Other | Admitting: Nurse Practitioner

## 2017-12-12 VITALS — BP 128/74 | HR 82 | Resp 16 | Ht 65.5 in | Wt 172.0 lb

## 2017-12-12 DIAGNOSIS — Z1231 Encounter for screening mammogram for malignant neoplasm of breast: Secondary | ICD-10-CM | POA: Diagnosis not present

## 2017-12-12 DIAGNOSIS — Z1239 Encounter for other screening for malignant neoplasm of breast: Secondary | ICD-10-CM

## 2017-12-12 DIAGNOSIS — R635 Abnormal weight gain: Secondary | ICD-10-CM

## 2017-12-12 DIAGNOSIS — I10 Essential (primary) hypertension: Secondary | ICD-10-CM | POA: Diagnosis not present

## 2017-12-12 DIAGNOSIS — F17210 Nicotine dependence, cigarettes, uncomplicated: Secondary | ICD-10-CM | POA: Diagnosis not present

## 2017-12-12 MED ORDER — VARENICLINE TARTRATE 0.5 MG X 11 & 1 MG X 42 PO MISC
ORAL | 0 refills | Status: DC
Start: 2017-12-12 — End: 2018-06-13

## 2017-12-12 MED ORDER — PHENTERMINE HCL 37.5 MG PO TABS: 37.5000 mg | ORAL_TABLET | Freq: Every day | ORAL | 1 refills | Status: DC

## 2017-12-12 NOTE — Progress Notes (Signed)
[facility] [place], [place] [address]  Internal MEDICINE  Office Visit Note  Patient Name: Angie [name]  [id number]  [id number]  Date of Service: 12/31/2017  Pt is here for routine follow up.   Chief Complaint  Patient presents with  . Hypertension  . Obesity  . Nicotine Dependence    Improved blood pressure since her last visit. Has improved her diet and started routine exercise program. Continues to be concerned about  Inability to lose weight. She would also like to quit smoking. Has tried chantix in the past and it did work. Would like to try this again.       Current Medication: Outpatient Encounter Medications as of 12/12/2017  Medication Sig  . Biotin 1 MG CAPS Take 1,000 mcg by mouth daily.  . cyclobenzaprine (FLEXERIL) 10 MG tablet Take 10 mg by mouth as needed for muscle spasms.  . hydrocortisone (ANUSOL-HC) 25 MG suppository Place 1 suppository (25 mg total) rectally 2 (two) times daily.  . hydrocortisone (PROCTOSOL HC) 2.5 % rectal cream Place 1 application rectally 2 (two) times daily.  Marland Kitchen lisinopril-hydrochlorothiazide (PRINZIDE,ZESTORETIC) 10-12.5 MG tablet Take 1 tab po daily  . pantoprazole (PROTONIX) 40 MG tablet   . phentermine (ADIPEX-P) 37.5 MG tablet Take 1 tablet (37.5 mg total) by mouth daily before breakfast.  . traMADol (ULTRAM) 50 MG tablet Take 2 tablets (100 mg total) by mouth 2 (two) times daily.  . varenicline (CHANTIX STARTING MONTH PAK) 0.5 MG X 11 & 1 MG X 42 tablet Take by mouth as directed   No facility-administered encounter medications on file as of 12/12/2017.     Surgical History: Past Surgical History:  Procedure Laterality Date  . COLONOSCOPY WITH PROPOFOL N/A 10/04/2016   Procedure: COLONOSCOPY WITH PROPOFOL;  Surgeon: Lollie Sails, MD;  Location[facility] ENDOSCOPY;  Service: Endoscopy;  Laterality: N/A;  . ESOPHAGOGASTRODUODENOSCOPY (EGD) WITH PROPOFOL N/A 05/13/2016   Procedure:  ESOPHAGOGASTRODUODENOSCOPY (EGD) WITH PROPOFOL;  Surgeon: Lollie Sails, MD;  Location: [facility] ENDOSCOPY;  Service: Endoscopy;  Laterality: N/A;  . JOINT REPLACEMENT Left 2015  . TONSILLECTOMY    . TONSILLECTOMY    . TOTAL KNEE ARTHROPLASTY      Medical History: Past Medical History:  Diagnosis Date  . Arthritis   . Hepatitis C 2014   treated with Harvoni  . Hypertension   . Kidney stone   . Melanoma ([place])   . Osteoarthritis   . Sepsis due to urinary tract infection ([place]) 2014  . Thrombocytopenia, primary (HCC)    Dr Grayland Ormond    Family History: Family History  Problem Relation Age of Onset  . Liver cancer Father   . Cancer Father   . COPD Father   . Heart disease Father   . COPD Mother   . Hypertension Mother   . Diabetes Sister   . Diabetes Paternal Grandmother   . Heart disease Paternal Grandmother   . Hypertension Paternal Grandmother   . Diabetes Paternal Grandfather   . Heart disease Paternal Grandfather   . Hypertension Paternal Grandfather     Social History   Socioeconomic History  . Marital status: Single    Spouse name: Not on file  . Number of children: Not on file  . Years of education: Not on file  . Highest education level: Not on file  Occupational History  . Not on file  Social Needs  . Financial resource strain: Not on file  . Food insecurity:  Worry: Not on file    Inability: Not on file  . Transportation needs:    Medical: Not on file    Non-medical: Not on file  Tobacco Use  . Smoking status: Current Some Day Smoker    Types: Cigarettes  . Smokeless tobacco: Never Used  Substance and Sexual Activity  . Alcohol use: Yes    Alcohol/week: 0.6 oz    Types: 1 Glasses of wine per week    Comment: occasionally last week  . Drug use: No  . Sexual activity: Yes    Birth control/protection: None, Post-menopausal  Lifestyle  . Physical activity:    Days per week: Not on file    Minutes per session: Not on file  . Stress: Not on  file  Relationships  . Social connections:    Talks on phone: Not on file    Gets together: Not on file    Attends religious service: Not on file    Active member of club or organization: Not on file    Attends meetings of clubs or organizations: Not on file    Relationship status: Not on file  . Intimate partner violence:    Fear of current or ex partner: Not on file    Emotionally abused: Not on file    Physically abused: Not on file    Forced sexual activity: Not on file  Other Topics Concern  . Not on file  Social History Narrative  . Not on file      Review of Systems  Constitutional: Negative for activity change, appetite change, fatigue, fever and unexpected weight change.  HENT: Positive for sore throat. Negative for congestion, ear pain, sinus pressure and voice change.   Eyes: Negative.   Respiratory: Positive for wheezing. Negative for cough, chest tightness and shortness of breath.   Cardiovascular: Negative for chest pain and palpitations.  Gastrointestinal: Negative for abdominal distention, constipation, diarrhea, nausea and vomiting.       Significant hemorrhoid.  Endocrine: Negative for cold intolerance, heat intolerance, polydipsia, polyphagia and polyuria.  Genitourinary:       Significant hemorrhoid, getting worse. Hard to sit. Bleeding often, close to menstrual bleeding in severity.   Musculoskeletal: Positive for arthralgias and back pain.  Skin: Negative for color change, pallor, rash and wound.  Allergic/Immunologic: Positive for environmental allergies. Negative for food allergies and immunocompromised state.  Neurological: Positive for headaches. Negative for weakness.  Hematological: Negative for adenopathy.  Psychiatric/Behavioral: Negative for behavioral problems. The patient is not nervous/anxious.     Today's Vitals   12/12/17 1513  BP: 128/74  Pulse: 82  Resp: 16  SpO2: 94%  Weight: 172 lb (78 kg)  Height: 5' 5.5" (1.664 m)     Physical Exam  Constitutional: She is oriented to person, place, and time. She appears well-developed and well-nourished.  HENT:  Head: Normocephalic.  Right Ear: Tympanic membrane is bulging.  Left Ear: Tympanic membrane is bulging.  Nose: Nose normal. No rhinorrhea. Right sinus exhibits no frontal sinus tenderness. Left sinus exhibits no frontal sinus tenderness.  Mouth/Throat: Oropharynx is clear and moist. No posterior oropharyngeal edema.  Eyes: Pupils are equal, round, and reactive to light. Conjunctivae are normal.  Neck: Normal range of motion. Neck supple. No thyromegaly present.  Cardiovascular: Normal rate, regular rhythm and normal heart sounds.  Pulmonary/Chest: Effort normal and breath sounds normal. She has no wheezes.     Abdominal: Soft. Bowel sounds are normal. There is no tenderness.  Genitourinary: Rectal  exam shows external hemorrhoid and tenderness. Pelvic exam was performed with patient in the knee-chest position.  Musculoskeletal: Normal range of motion.  Chronic lower back pain which can be worse with bending and twisting at the waist. No visible or papable abnormalities or deformities noted at theis time.   Lymphadenopathy:    She has no cervical adenopathy.  Neurological: She is alert and oriented to person, place, and time.  Skin: Skin is warm and dry.  Psychiatric: She has a normal mood and affect. Her behavior is normal. Judgment and thought content normal.  Nursing note and vitals reviewed.  Assessment/Plan: 1. Essential hypertension Improved. Continue blood pressure medication as prescribed.   2. Abnormal weight gain Will start phentermine daily. Limit calorie intake to 1200-1500 calories per day. Encouraged regular physical activity to help with weight loss.  - phentermine (ADIPEX-P) 37.5 MG tablet; Take 1 tablet (37.5 mg total) by mouth daily before breakfast.  Dispense: 30 tablet; Refill: 1  3. Cigarette smoker Start chantix. Prescription sent  to pharmacy for starting month pack. Written prescriptions for chantix continuiing month packs provided.  - varenicline (CHANTIX STARTING MONTH PAK) 0.5 MG X 11 & 1 MG X 42 tablet; Take by mouth as directed  Dispense: 53 tablet; Refill: 0  4. Screening for breast cancer - MM DIGITAL SCREENING BILATERAL; Future    General Counseling: tationa stech understanding of the findings of todays visit and agrees with plan of treatment. I have discussed any further diagnostic evaluation that may be needed or ordered today. We also reviewed her medications today. she has been encouraged to call the office with any questions or concerns that should arise related to todays visit.    There is a liability release in patients' chart. There has been a 10 minute discussion about the side effects including but not limited to elevated blood pressure, anxiety, lack of sleep and dry mouth. Pt understands and will like to start/continue on appetite suppressant at this time. There will be one month RX given at the time of visit with proper follow up. Nova diet plan with restricted calories is given to the pt. Pt understands and agrees with  plan of treatment  This patient was seen by Leretha Pol, FNP- C in Collaboration with Dr Lavera Guise as a part of collaborative care agreement    Orders Placed This Encounter  Procedures  . MM DIGITAL SCREENING BILATERAL    Meds ordered this encounter  Medications  . varenicline (CHANTIX STARTING MONTH PAK) 0.5 MG X 11 & 1 MG X 42 tablet    Sig: Take by mouth as directed    Dispense:  53 tablet    Refill:  0    Order Specific Question:   Supervising Provider    Answer:   Lavera Guise [place]  . phentermine (ADIPEX-P) 37.5 MG tablet    Sig: Take 1 tablet (37.5 mg total) by mouth daily before breakfast.    Dispense:  30 tablet    Refill:  1    Order Specific Question:   Supervising Provider    Answer:   Lavera Guise [[id number]]    Time spent: 2  Minutes     Dr Lavera Guise Internal medicine

## 2017-12-22 ENCOUNTER — Other Ambulatory Visit
Admission: RE | Admit: 2017-12-22 | Discharge: 2017-12-22 | Disposition: A | Discharge status: Home / Self Care | Payer: Medicare Other | Source: Ambulatory Visit | Attending: Internal Medicine | Admitting: Internal Medicine

## 2017-12-22 DIAGNOSIS — I10 Essential (primary) hypertension: Secondary | ICD-10-CM | POA: Diagnosis not present

## 2017-12-22 DIAGNOSIS — Z0001 Encounter for general adult medical examination with abnormal findings: Secondary | ICD-10-CM | POA: Diagnosis not present

## 2017-12-22 DIAGNOSIS — R635 Abnormal weight gain: Secondary | ICD-10-CM | POA: Insufficient documentation

## 2017-12-22 LAB — COMPREHENSIVE METABOLIC PANEL
ALBUMIN: 4.4 g/dL (ref 3.5–5.0)
ALT: 31 U/L (ref 14–54)
ANION GAP: 8 (ref 5–15)
AST: 30 U/L (ref 15–41)
Alkaline Phosphatase: 73 U/L (ref 38–126)
BILIRUBIN TOTAL: 0.7 mg/dL (ref 0.3–1.2)
BUN: 20 mg/dL (ref 6–20)
CO2: 23 mmol/L (ref 22–32)
Calcium: 9.1 mg/dL (ref 8.9–10.3)
Chloride: 107 mmol/L (ref 101–111)
Creatinine, Ser: 0.62 mg/dL (ref 0.44–1.00)
GFR calc Af Amer: >60 mL/min (ref >60)
Glucose, Bld: 100 mg/dL — ABNORMAL HIGH (ref 65–99)
POTASSIUM: 4.1 mmol/L (ref 3.5–5.1)
Sodium: 138 mmol/L (ref 135–145)
TOTAL PROTEIN: 7.2 g/dL (ref 6.5–8.1)

## 2017-12-22 LAB — CBC
HEMATOCRIT: 41.7 % (ref 35.0–47.0)
Hemoglobin: 14.5 g/dL (ref 12.0–16.0)
MCH: 32.9 pg (ref 26.0–34.0)
MCHC: 34.7 g/dL (ref 32.0–36.0)
MCV: 94.7 fL (ref 80.0–100.0)
PLATELETS: 138 10*3/uL — AB (ref 150–440)
RBC: 4.4 MIL/uL (ref 3.80–5.20)
RDW: 13.1 % (ref 11.5–14.5)
WBC: 6.1 10*3/uL (ref 3.6–11.0)

## 2017-12-22 LAB — TSH: TSH: 2.209 u[IU]/mL (ref 0.350–4.500)

## 2017-12-22 LAB — LIPID PANEL
CHOLESTEROL: 204 mg/dL — AB (ref 0–200)
HDL: 67 mg/dL (ref >40)
LDL Cholesterol: 106 mg/dL — ABNORMAL HIGH (ref 0–99)
Total CHOL/HDL Ratio: 3 RATIO
Triglycerides: 154 mg/dL — ABNORMAL HIGH (ref <150)
VLDL: 31 mg/dL (ref 0–40)

## 2017-12-22 LAB — T4, FREE: FREE T4: 0.76 ng/dL — AB (ref 0.82–1.77)

## 2017-12-28 ENCOUNTER — Ambulatory Visit: Payer: Medicare Other | Admitting: Urology

## 2017-12-31 DIAGNOSIS — Z124 Encounter for screening for malignant neoplasm of cervix: Secondary | ICD-10-CM | POA: Insufficient documentation

## 2017-12-31 DIAGNOSIS — F17210 Nicotine dependence, cigarettes, uncomplicated: Secondary | ICD-10-CM | POA: Insufficient documentation

## 2018-01-11 ENCOUNTER — Ambulatory Visit (INDEPENDENT_AMBULATORY_CARE_PROVIDER_SITE_OTHER): Payer: Medicare Other | Admitting: Nurse Practitioner

## 2018-01-11 ENCOUNTER — Encounter: Payer: Self-pay | Admitting: Nurse Practitioner

## 2018-01-11 VITALS — BP 142/80 | HR 96 | Resp 16 | Ht 65.0 in | Wt 171.4 lb

## 2018-01-11 DIAGNOSIS — R635 Abnormal weight gain: Secondary | ICD-10-CM

## 2018-01-11 DIAGNOSIS — I10 Essential (primary) hypertension: Secondary | ICD-10-CM | POA: Diagnosis not present

## 2018-01-11 MED ORDER — PHENTERMINE HCL 37.5 MG PO TABS: 37.5000 mg | ORAL_TABLET | Freq: Every day | ORAL | 1 refills | Status: DC

## 2018-01-11 NOTE — Progress Notes (Signed)
[facility] [place], [place] [address]  Internal MEDICINE  Office Visit Note  Patient Name: Angie [name]  [id number]  [id number]  Date of Service: 02/03/2018   Pt is here for routine follow up.   Chief Complaint  Patient presents with  . weight management    1 pound weight loss since initially started on phentermine.     The patient has lost 1 pound since her last visit. Currently taking phentermine. Doing well. Has limited her calorie intake to 1200 calories per day. She is gradually increasing her low-impact exercise. She reports no negative side effects associated with taing phentermine.       Current Medication: Outpatient Encounter Medications as of 01/11/2018  Medication Sig  . Biotin 1 MG CAPS Take 1,000 mcg by mouth daily.  . cyclobenzaprine (FLEXERIL) 10 MG tablet Take 10 mg by mouth as needed for muscle spasms.  . hydrocortisone (ANUSOL-HC) 25 MG suppository Place 1 suppository (25 mg total) rectally 2 (two) times daily.  . hydrocortisone (PROCTOSOL HC) 2.5 % rectal cream Place 1 application rectally 2 (two) times daily.  Marland Kitchen lisinopril-hydrochlorothiazide (PRINZIDE,ZESTORETIC) 10-12.5 MG tablet Take 1 tab po daily  . pantoprazole (PROTONIX) 40 MG tablet   . phentermine (ADIPEX-P) 37.5 MG tablet Take 1 tablet (37.5 mg total) by mouth daily before breakfast.  . traMADol (ULTRAM) 50 MG tablet Take 2 tablets (100 mg total) by mouth 2 (two) times daily.  . varenicline (CHANTIX STARTING MONTH PAK) 0.5 MG X 11 & 1 MG X 42 tablet Take by mouth as directed  . [DISCONTINUED] phentermine (ADIPEX-P) 37.5 MG tablet Take 1 tablet (37.5 mg total) by mouth daily before breakfast.   No facility-administered encounter medications on file as of 01/11/2018.     Surgical History: Past Surgical History:  Procedure Laterality Date  . COLONOSCOPY WITH PROPOFOL N/A 10/04/2016   Procedure: COLONOSCOPY WITH PROPOFOL;  Surgeon: Lollie Sails, MD;  Location:  [facility] ENDOSCOPY;  Service: Endoscopy;  Laterality: N/A;  . ESOPHAGOGASTRODUODENOSCOPY (EGD) WITH PROPOFOL N/A 05/13/2016   Procedure: ESOPHAGOGASTRODUODENOSCOPY (EGD) WITH PROPOFOL;  Surgeon: Lollie Sails, MD;  Location: [facility] ENDOSCOPY;  Service: Endoscopy;  Laterality: N/A;  . JOINT REPLACEMENT Left 2015  . TONSILLECTOMY    . TONSILLECTOMY    . TOTAL KNEE ARTHROPLASTY      Medical History: Past Medical History:  Diagnosis Date  . Arthritis   . Hepatitis C 2014   treated with Harvoni  . Hypertension   . Kidney stone   . Melanoma ([place])   . Osteoarthritis   . Sepsis due to urinary tract infection ([place]) 2014  . Thrombocytopenia, primary (HCC)    Dr Grayland Ormond    Family History: Family History  Problem Relation Age of Onset  . Liver cancer Father   . Cancer Father   . COPD Father   . Heart disease Father   . COPD Mother   . Hypertension Mother   . Diabetes Sister   . Diabetes Paternal Grandmother   . Heart disease Paternal Grandmother   . Hypertension Paternal Grandmother   . Diabetes Paternal Grandfather   . Heart disease Paternal Grandfather   . Hypertension Paternal Grandfather     Social History   Socioeconomic History  . Marital status: Single    Spouse name: Not on file  . Number of children: Not on file  . Years of education: Not on file  . Highest education level: Not on file  Occupational History  .  Not on file  Social Needs  . Financial resource strain: Not on file  . Food insecurity:    Worry: Not on file    Inability: Not on file  . Transportation needs:    Medical: Not on file    Non-medical: Not on file  Tobacco Use  . Smoking status: Current Some Day Smoker    Types: Cigarettes  . Smokeless tobacco: Never Used  Substance and Sexual Activity  . Alcohol use: Yes    Alcohol/week: 0.6 oz    Types: 1 Glasses of wine per week    Comment: occasionally last week  . Drug use: No  . Sexual activity: Yes    Birth control/protection: None,  Post-menopausal  Lifestyle  . Physical activity:    Days per week: Not on file    Minutes per session: Not on file  . Stress: Not on file  Relationships  . Social connections:    Talks on phone: Not on file    Gets together: Not on file    Attends religious service: Not on file    Active member of club or organization: Not on file    Attends meetings of clubs or organizations: Not on file    Relationship status: Not on file  . Intimate partner violence:    Fear of current or ex partner: Not on file    Emotionally abused: Not on file    Physically abused: Not on file    Forced sexual activity: Not on file  Other Topics Concern  . Not on file  Social History Narrative  . Not on file      Review of Systems  Constitutional: Negative for activity change, appetite change, fatigue, fever and unexpected weight change.       One pound weight loss since most recent visit.   HENT: Negative for congestion, ear pain, sinus pressure, sore throat and voice change.   Eyes: Negative.   Respiratory: Negative for cough, chest tightness, shortness of breath and wheezing.   Cardiovascular: Negative for chest pain and palpitations.  Gastrointestinal: Negative for abdominal distention, constipation, diarrhea, nausea and vomiting.       Significant hemorrhoid.  Endocrine: Negative for cold intolerance, heat intolerance, polydipsia, polyphagia and polyuria.  Musculoskeletal: Positive for arthralgias and back pain.  Skin: Negative for color change, pallor, rash and wound.  Allergic/Immunologic: Positive for environmental allergies. Negative for food allergies and immunocompromised state.  Neurological: Positive for headaches. Negative for weakness.  Hematological: Negative for adenopathy.  Psychiatric/Behavioral: Negative for behavioral problems. The patient is not nervous/anxious.     Today's Vitals   01/11/18 1130  BP: (!) 142/80  Pulse: 96  Resp: 16  SpO2: 97%  Weight: 171 lb 6.4 oz (77.7  kg)  Height: 5\' 5"  (1.651 m)    Physical Exam  Constitutional: She is oriented to person, place, and time. She appears well-developed and well-nourished.  HENT:  Head: Normocephalic and atraumatic.  Right Ear: Tympanic membrane is bulging.  Left Ear: Tympanic membrane is bulging.  Nose: Nose normal. No rhinorrhea. Right sinus exhibits no frontal sinus tenderness. Left sinus exhibits no frontal sinus tenderness.  Mouth/Throat: Oropharynx is clear and moist. No posterior oropharyngeal edema.  Eyes: Pupils are equal, round, and reactive to light. Conjunctivae are normal.  Neck: Normal range of motion. Neck supple. No thyromegaly present.  Cardiovascular: Normal rate, regular rhythm and normal heart sounds.  Pulmonary/Chest: Effort normal and breath sounds normal. She has no wheezes.  Abdominal: Soft. Bowel  sounds are normal. There is no tenderness.  Genitourinary: Rectal exam shows external hemorrhoid and tenderness. Pelvic exam was performed with patient in the knee-chest position.  Musculoskeletal: Normal range of motion.  Chronic lower back pain which can be worse with bending and twisting at the waist. No visible or papable abnormalities or deformities noted at theis time.   Lymphadenopathy:    She has no cervical adenopathy.  Neurological: She is alert and oriented to person, place, and time.  Skin: Skin is warm and dry. Capillary refill takes less than 2 seconds.  Psychiatric: She has a normal mood and affect. Her behavior is normal. Judgment and thought content normal.  Nursing note and vitals reviewed.  Assessment/Plan: 1. Essential hypertension Stable. Continue bp medication as prescribed.   2. Abnormal weight gain Continue phentermine daily. Limit calorie intake to 1200-1500 calories per day and continue with low-impact exercise.  - phentermine (ADIPEX-P) 37.5 MG tablet; Take 1 tablet (37.5 mg total) by mouth daily before breakfast.  Dispense: 30 tablet; Refill: 1  General  Counseling: Kayelyn verbalizes understanding of the findings of todays visit and agrees with plan of treatment. I have discussed any further diagnostic evaluation that may be needed or ordered today. We also reviewed her medications today. she has been encouraged to call the office with any questions or concerns that should arise related to todays visit.    Counseling:   There is a liability release in patients' chart. There has been a 10 minute discussion about the side effects including but not limited to elevated blood pressure, anxiety, lack of sleep and dry mouth. Pt understands and will like to start/continue on appetite suppressant at this time. There will be one month RX given at the time of visit with proper follow up. Nova diet plan with restricted calories is given to the pt. Pt understands and agrees with  plan of treatment  This patient was seen by Leretha Pol, FNP- C in Collaboration with Dr Lavera Guise as a part of collaborative care agreement    Meds ordered this encounter  Medications  . phentermine (ADIPEX-P) 37.5 MG tablet    Sig: Take 1 tablet (37.5 mg total) by mouth daily before breakfast.    Dispense:  30 tablet    Refill:  1    Order Specific Question:   Supervising Provider    Answer:   Lavera Guise [[id number]]    Time spent: 31 Minutes    Dr Lavera Guise Internal medicine

## 2018-01-22 ENCOUNTER — Ambulatory Visit: Payer: Medicare Other

## 2018-01-30 ENCOUNTER — Ambulatory Visit
Admission: RE | Admit: 2018-01-30 | Discharge: 2018-01-30 | Disposition: A | Discharge status: Home / Self Care | Payer: Medicare Other | Source: Ambulatory Visit | Attending: Gastroenterology | Admitting: Gastroenterology

## 2018-01-30 DIAGNOSIS — K7460 Unspecified cirrhosis of liver: Secondary | ICD-10-CM | POA: Diagnosis not present

## 2018-02-08 ENCOUNTER — Ambulatory Visit: Payer: Self-pay | Admitting: Nurse Practitioner

## 2018-02-19 ENCOUNTER — Ambulatory Visit: Payer: Medicare Other | Admitting: Nurse Practitioner

## 2018-02-19 ENCOUNTER — Encounter: Payer: Self-pay | Admitting: Nurse Practitioner

## 2018-02-19 VITALS — BP 147/71 | HR 93 | Resp 16 | Ht 65.5 in | Wt 167.0 lb

## 2018-02-19 DIAGNOSIS — E663 Overweight: Secondary | ICD-10-CM

## 2018-02-19 DIAGNOSIS — G8929 Other chronic pain: Secondary | ICD-10-CM

## 2018-02-19 DIAGNOSIS — M5442 Lumbago with sciatica, left side: Secondary | ICD-10-CM | POA: Diagnosis not present

## 2018-02-19 DIAGNOSIS — I10 Essential (primary) hypertension: Secondary | ICD-10-CM

## 2018-02-19 DIAGNOSIS — M5441 Lumbago with sciatica, right side: Secondary | ICD-10-CM

## 2018-02-19 MED ORDER — PHENTERMINE HCL 37.5 MG PO TABS: 37.5000 mg | ORAL_TABLET | Freq: Every day | ORAL | 1 refills | Status: DC

## 2018-02-19 MED ORDER — TRAMADOL HCL 50 MG PO TABS: 100.0000 mg | ORAL_TABLET | Freq: Two times a day (BID) | ORAL | 3 refills | Status: DC

## 2018-02-19 NOTE — Progress Notes (Signed)
[facility] [place], [place] [address]  Internal MEDICINE  Office Visit Note  Patient Name: Angie [name]  [id number]  [id number]  Date of Service: 03/14/2018     Pt is here for routine follow up.   Chief Complaint  Patient presents with  . Weight Loss    The patient has lost 4 pounds since last visit. Currently on phentermine. Doing well. No negative side effects.  Time to recheck thyroid panel as well as bmp and HgbA1c.     Current Medication: Outpatient Encounter Medications as of 02/19/2018  Medication Sig  . Biotin 1 MG CAPS Take 1,000 mcg by mouth daily.  . cyclobenzaprine (FLEXERIL) 10 MG tablet Take 10 mg by mouth as needed for muscle spasms.  . hydrocortisone (ANUSOL-HC) 25 MG suppository Place 1 suppository (25 mg total) rectally 2 (two) times daily.  . hydrocortisone (PROCTOSOL HC) 2.5 % rectal cream Place 1 application rectally 2 (two) times daily.  Marland Kitchen lisinopril-hydrochlorothiazide (PRINZIDE,ZESTORETIC) 10-12.5 MG tablet Take 1 tab po daily  . pantoprazole (PROTONIX) 40 MG tablet   . phentermine (ADIPEX-P) 37.5 MG tablet Take 1 tablet (37.5 mg total) by mouth daily before breakfast.  . traMADol (ULTRAM) 50 MG tablet Take 2 tablets (100 mg total) by mouth 2 (two) times daily.  . varenicline (CHANTIX STARTING MONTH PAK) 0.5 MG X 11 & 1 MG X 42 tablet Take by mouth as directed  . [DISCONTINUED] phentermine (ADIPEX-P) 37.5 MG tablet Take 1 tablet (37.5 mg total) by mouth daily before breakfast.  . [DISCONTINUED] traMADol (ULTRAM) 50 MG tablet Take 2 tablets (100 mg total) by mouth 2 (two) times daily.   No facility-administered encounter medications on file as of 02/19/2018.     Surgical History: Past Surgical History:  Procedure Laterality Date  . COLONOSCOPY WITH PROPOFOL N/A 10/04/2016   Procedure: COLONOSCOPY WITH PROPOFOL;  Surgeon: Lollie Sails, MD;  Location: [facility] ENDOSCOPY;  Service: Endoscopy;  Laterality: N/A;  .  ESOPHAGOGASTRODUODENOSCOPY (EGD) WITH PROPOFOL N/A 05/13/2016   Procedure: ESOPHAGOGASTRODUODENOSCOPY (EGD) WITH PROPOFOL;  Surgeon: Lollie Sails, MD;  Location: [facility] ENDOSCOPY;  Service: Endoscopy;  Laterality: N/A;  . JOINT REPLACEMENT Left 2015  . TONSILLECTOMY    . TONSILLECTOMY    . TOTAL KNEE ARTHROPLASTY      Medical History: Past Medical History:  Diagnosis Date  . Arthritis   . Hepatitis C 2014   treated with Harvoni  . Hypertension   . Kidney stone   . Melanoma ([place])   . Osteoarthritis   . Sepsis due to urinary tract infection ([place]) 2014  . Thrombocytopenia, primary ([facility])    Dr Grayland Ormond    Family History: Family History  Problem Relation Age of Onset  . Liver cancer Father   . Cancer Father   . COPD Father   . Heart disease Father   . COPD Mother   . Hypertension Mother   . Diabetes Sister   . Diabetes Paternal Grandmother   . Heart disease Paternal Grandmother   . Hypertension Paternal Grandmother   . Diabetes Paternal Grandfather   . Heart disease Paternal Grandfather   . Hypertension Paternal Grandfather     Social History   Socioeconomic History  . Marital status: Single    Spouse name: Not on file  . Number of children: Not on file  . Years of education: Not on file  . Highest education level: Not on file  Occupational History  . Not on file  Social  Needs  . Financial resource strain: Not on file  . Food insecurity:    Worry: Not on file    Inability: Not on file  . Transportation needs:    Medical: Not on file    Non-medical: Not on file  Tobacco Use  . Smoking status: Current Some Day Smoker    Types: Cigarettes  . Smokeless tobacco: Never Used  Substance and Sexual Activity  . Alcohol use: Yes    Alcohol/week: 0.6 oz    Types: 1 Glasses of wine per week    Comment: occasionally last week  . Drug use: No  . Sexual activity: Yes    Birth control/protection: None, Post-menopausal  Lifestyle  . Physical activity:    Days per  week: Not on file    Minutes per session: Not on file  . Stress: Not on file  Relationships  . Social connections:    Talks on phone: Not on file    Gets together: Not on file    Attends religious service: Not on file    Active member of club or organization: Not on file    Attends meetings of clubs or organizations: Not on file    Relationship status: Not on file  . Intimate partner violence:    Fear of current or ex partner: Not on file    Emotionally abused: Not on file    Physically abused: Not on file    Forced sexual activity: Not on file  Other Topics Concern  . Not on file  Social History Narrative  . Not on file      Review of Systems  Constitutional: Negative for activity change, appetite change, fatigue, fever and unexpected weight change.       Four pound weight loss since most recent visit.   HENT: Negative for congestion, ear pain, sinus pressure, sore throat and voice change.   Eyes: Negative.   Respiratory: Negative for cough, chest tightness, shortness of breath and wheezing.   Cardiovascular: Negative for chest pain and palpitations.  Gastrointestinal: Negative for abdominal distention, constipation, diarrhea, nausea and vomiting.  Endocrine: Negative for cold intolerance, heat intolerance, polydipsia, polyphagia and polyuria.  Genitourinary: Negative.   Musculoskeletal: Positive for arthralgias and back pain.  Skin: Negative for color change, pallor, rash and wound.  Allergic/Immunologic: Positive for environmental allergies. Negative for food allergies and immunocompromised state.  Neurological: Positive for headaches. Negative for weakness.  Hematological: Negative for adenopathy.  Psychiatric/Behavioral: Negative for behavioral problems. The patient is not nervous/anxious.     Today's Vitals   02/19/18 1555  BP: (!) 147/71  Pulse: 93  Resp: 16  SpO2: 96%  Weight: 167 lb (75.8 kg)  Height: 5' 5.5" (1.664 m)   Physical Exam  Constitutional: She  is oriented to person, place, and time. She appears well-developed and well-nourished.  HENT:  Head: Normocephalic and atraumatic.  Right Ear: Tympanic membrane is bulging.  Left Ear: Tympanic membrane is bulging.  Nose: Nose normal. No rhinorrhea. Right sinus exhibits no frontal sinus tenderness. Left sinus exhibits no frontal sinus tenderness.  Mouth/Throat: Oropharynx is clear and moist. No posterior oropharyngeal edema.  Eyes: Pupils are equal, round, and reactive to light. Conjunctivae are normal.  Neck: Normal range of motion. Neck supple. No thyromegaly present.  Cardiovascular: Normal rate, regular rhythm and normal heart sounds.  Pulmonary/Chest: Effort normal and breath sounds normal. She has no wheezes.  Abdominal: Soft. Bowel sounds are normal. There is no tenderness.  Genitourinary: Pelvic exam was  performed with patient in the knee-chest position.  Musculoskeletal: Normal range of motion.  Chronic lower back pain which can be worse with bending and twisting at the waist. No visible or papable abnormalities or deformities noted at theis time.   Lymphadenopathy:    She has no cervical adenopathy.  Neurological: She is alert and oriented to person, place, and time.  Skin: Skin is warm and dry. Capillary refill takes less than 2 seconds.  Psychiatric: She has a normal mood and affect. Her behavior is normal. Judgment and thought content normal.  Nursing note and vitals reviewed.  Assessment/Plan: 1. Essential hypertension Stable. Continue current bp medication  2. Overweight Improving. May continue phentermine 37.5mg  daily. limt calorie intake to 1200-1500 calories per day. Regular physical activity as tolerated.  - phentermine (ADIPEX-P) 37.5 MG tablet; Take 1 tablet (37.5 mg total) by mouth daily before breakfast.  Dispense: 30 tablet; Refill: 1  3. Chronic low back pain with bilateral sciatica, unspecified back pain laterality OK to continue tramadol as needed and as  prescribed. A new prescription was sent to pharmacy today. - traMADol (ULTRAM) 50 MG tablet; Take 2 tablets (100 mg total) by mouth 2 (two) times daily.  Dispense: 120 tablet; Refill: 3  General Counseling: Winefred verbalizes understanding of the findings of todays visit and agrees with plan of treatment. I have discussed any further diagnostic evaluation that may be needed or ordered today. We also reviewed her medications today. she has been encouraged to call the office with any questions or concerns that should arise related to todays visit.    Counseling:   There is a liability release in patients' chart. There has been a 10 minute discussion about the side effects including but not limited to elevated blood pressure, anxiety, lack of sleep and dry mouth. Pt understands and will like to start/continue on appetite suppressant at this time. There will be one month RX given at the time of visit with proper follow up. Nova diet plan with restricted calories is given to the pt. Pt understands and agrees with  plan of treatment  This patient was seen by Leretha Pol FNP Collaboration with Dr Lavera Guise as a part of collaborative care agreement  Meds ordered this encounter  Medications  . phentermine (ADIPEX-P) 37.5 MG tablet    Sig: Take 1 tablet (37.5 mg total) by mouth daily before breakfast.    Dispense:  30 tablet    Refill:  1    Order Specific Question:   Supervising Provider    Answer:   Lavera Guise [place]  . traMADol (ULTRAM) 50 MG tablet    Sig: Take 2 tablets (100 mg total) by mouth 2 (two) times daily.    Dispense:  120 tablet    Refill:  3    Order Specific Question:   Supervising Provider    Answer:   Lavera Guise [[id number]]    Time spent: 63 Minutes          Dr Lavera Guise Internal medicine

## 2018-03-08 ENCOUNTER — Other Ambulatory Visit
Admission: RE | Admit: 2018-03-08 | Discharge: 2018-03-08 | Disposition: A | Discharge status: Home / Self Care | Payer: Medicare Other | Source: Ambulatory Visit | Attending: Nurse Practitioner | Admitting: Nurse Practitioner

## 2018-03-08 DIAGNOSIS — R7301 Impaired fasting glucose: Secondary | ICD-10-CM | POA: Diagnosis not present

## 2018-03-08 DIAGNOSIS — E039 Hypothyroidism, unspecified: Secondary | ICD-10-CM | POA: Diagnosis not present

## 2018-03-08 DIAGNOSIS — K219 Gastro-esophageal reflux disease without esophagitis: Secondary | ICD-10-CM | POA: Diagnosis not present

## 2018-03-08 DIAGNOSIS — K7460 Unspecified cirrhosis of liver: Secondary | ICD-10-CM | POA: Diagnosis not present

## 2018-03-08 LAB — BASIC METABOLIC PANEL
Anion gap: 7 (ref 5–15)
BUN: 26 mg/dL — ABNORMAL HIGH (ref 6–20)
CO2: 27 mmol/L (ref 22–32)
Calcium: 9.5 mg/dL (ref 8.9–10.3)
Chloride: 106 mmol/L (ref 98–111)
Creatinine, Ser: 0.73 mg/dL (ref 0.44–1.00)
GFR calc Af Amer: >60 mL/min (ref >60)
GFR calc non Af Amer: >60 mL/min (ref >60)
Glucose, Bld: 108 mg/dL — ABNORMAL HIGH (ref 70–99)
Potassium: 4.3 mmol/L (ref 3.5–5.1)
Sodium: 140 mmol/L (ref 135–145)

## 2018-03-08 LAB — HEMOGLOBIN A1C
Hgb A1c MFr Bld: 5.7 % — ABNORMAL HIGH (ref 4.8–5.6)
Mean Plasma Glucose: 116.89 mg/dL

## 2018-03-08 LAB — TSH: TSH: 2.849 u[IU]/mL (ref 0.350–4.500)

## 2018-03-08 LAB — T4, FREE: FREE T4: 1.81 ng/dL — AB (ref 0.82–1.77)

## 2018-03-09 NOTE — Progress Notes (Signed)
PT WAS NOTIFED. 

## 2018-03-14 ENCOUNTER — Encounter: Payer: Self-pay | Admitting: Nurse Practitioner

## 2018-03-26 ENCOUNTER — Ambulatory Visit: Payer: Medicare Other | Admitting: Oncology

## 2018-03-26 ENCOUNTER — Other Ambulatory Visit: Payer: Medicare Other

## 2018-03-26 NOTE — Progress Notes (Signed)
[facility]  [phone] [phone]  ID: Angie [name] OB: 22-Aug-1956  MR#: [id number]  LKG#:[id number]  Patient Care Team: Lavera Guise, MD as PCP - General (Internal Medicine)  CHIEF COMPLAINT: Autoimmune thrombocytopenia.  INTERVAL HISTORY: Patient returns to clinic today for repeat laboratory work and routine yearly follow-up.  She continues to feel well and remains asymptomatic.  She has no neurologic complaints.  She denies any recent fevers or illnesses.  She has a good appetite and denies weight loss.  She has no chest pain or shortness of breath.  She denies any nausea, vomiting, constipation, or diarrhea.  She has no melena or hematochezia.  She has no urinary complaints.  Patient feels at her baseline offers no specific complaints today.  REVIEW OF SYSTEMS:   Review of Systems  Constitutional: Negative.  Negative for fever, malaise/fatigue and weight loss.  Respiratory: Negative.  Negative for cough, hemoptysis and shortness of breath.   Cardiovascular: Negative.  Negative for chest pain and leg swelling.  Gastrointestinal: Negative.  Negative for abdominal pain.  Genitourinary: Negative.  Negative for dysuria.  Musculoskeletal: Negative.  Negative for back pain.  Skin: Negative.  Negative for rash.  Neurological: Negative.  Negative for sensory change, focal weakness, weakness and headaches.  Psychiatric/Behavioral: Negative.  The patient is not nervous/anxious.     As per HPI. Otherwise, a complete review of systems is negative.  PAST MEDICAL HISTORY: Past Medical History:  Diagnosis Date  . Arthritis   . Hepatitis C 2014   treated with Harvoni  . Hypertension   . Kidney stone   . Melanoma ([place])   . Osteoarthritis   . Sepsis due to urinary tract infection ([place]) 2014  . Thrombocytopenia, primary ([place])    Dr Grayland Ormond    PAST SURGICAL HISTORY: Past Surgical History:  Procedure Laterality Date  . COLONOSCOPY WITH  PROPOFOL N/A 10/04/2016   Procedure: COLONOSCOPY WITH PROPOFOL;  Surgeon: Lollie Sails, MD;  Location[facility] ENDOSCOPY;  Service: Endoscopy;  Laterality: N/A;  . ESOPHAGOGASTRODUODENOSCOPY (EGD) WITH PROPOFOL N/A 05/13/2016   Procedure: ESOPHAGOGASTRODUODENOSCOPY (EGD) WITH PROPOFOL;  Surgeon: Lollie Sails, MD;  Location: [facility] ENDOSCOPY;  Service: Endoscopy;  Laterality: N/A;  . JOINT REPLACEMENT Left 2015  . TONSILLECTOMY    . TONSILLECTOMY    . TOTAL KNEE ARTHROPLASTY      FAMILY HISTORY: Family History  Problem Relation Age of Onset  . Liver cancer Father   . Cancer Father   . COPD Father   . Heart disease Father   . COPD Mother   . Hypertension Mother   . Diabetes Sister   . Diabetes Paternal Grandmother   . Heart disease Paternal Grandmother   . Hypertension Paternal Grandmother   . Diabetes Paternal Grandfather   . Heart disease Paternal Grandfather   . Hypertension Paternal Grandfather     ADVANCED DIRECTIVES (Y/N):  N  HEALTH MAINTENANCE: Social History   Tobacco Use  . Smoking status: Current Some Day Smoker    Types: Cigarettes  . Smokeless tobacco: Never Used  Substance Use Topics  . Alcohol use: Yes    Alcohol/week: 1.0 standard drinks    Types: 1 Glasses of wine per week    Comment: occasionally last week  . Drug use: No     Colonoscopy:  PAP:  Bone density:  Lipid panel:  Allergies  Allergen Reactions  . Aspirin Nausea And Vomiting  . Nsaids Other (See Comments)    Liver function/[place]  Current Outpatient Medications  Medication Sig Dispense Refill  . Biotin 1 MG CAPS Take 1,000 mcg by mouth daily.    Marland Kitchen lisinopril-hydrochlorothiazide (PRINZIDE,ZESTORETIC) 10-12.5 MG tablet Take 1 tab po daily 30 tablet 5  . pantoprazole (PROTONIX) 40 MG tablet     . phentermine (ADIPEX-P) 37.5 MG tablet Take 1 tablet (37.5 mg total) by mouth daily before breakfast. 30 tablet 1  . traMADol (ULTRAM) 50 MG tablet Take 2 tablets (100 mg total) by  mouth 2 (two) times daily. 120 tablet 3  . varenicline (CHANTIX STARTING MONTH PAK) 0.5 MG X 11 & 1 MG X 42 tablet Take by mouth as directed 53 tablet 0  . cyclobenzaprine (FLEXERIL) 10 MG tablet Take 10 mg by mouth as needed for muscle spasms.    . hydrocortisone (ANUSOL-HC) 25 MG suppository Place 1 suppository (25 mg total) rectally 2 (two) times daily. (Patient not taking: Reported on 03/27/2018) 12 suppository 0  . hydrocortisone (PROCTOSOL HC) 2.5 % rectal cream Place 1 application rectally 2 (two) times daily. (Patient not taking: Reported on 03/27/2018) 30 g 0   No current facility-administered medications for this visit.     OBJECTIVE: Vitals:   03/27/18 1451  BP: (!) 145/94  Pulse: 85  Resp: 18  Temp: (!) 96.9 F (36.1 C)     Body mass index is 27.4 kg/m.    ECOG FS:0 - Asymptomatic  General: Well-developed, well-nourished, no acute distress. Eyes: Pink conjunctiva, anicteric sclera. HEENT: Normocephalic, moist mucous membranes. Lungs: Clear to auscultation bilaterally. Heart: Regular rate and rhythm. No rubs, murmurs, or gallops. Abdomen: Soft, nontender, nondistended. No organomegaly noted, normoactive bowel sounds. Musculoskeletal: No edema, cyanosis, or clubbing. Neuro: Alert, answering all questions appropriately. Cranial nerves grossly intact. Skin: No rashes or petechiae noted. Psych: Normal affect.  LAB RESULTS:  Lab Results  Component Value Date   NA 140 03/08/2018   K 4.3 03/08/2018   CL 106 03/08/2018   CO2 27 03/08/2018   GLUCOSE 108 (H) 03/08/2018   BUN 26 (H) 03/08/2018   CREATININE 0.73 03/08/2018   CALCIUM 9.5 03/08/2018   PROT 7.2 12/22/2017   ALBUMIN 4.4 12/22/2017   AST 30 12/22/2017   ALT 31 12/22/2017   ALKPHOS 73 12/22/2017   BILITOT 0.7 12/22/2017   GFRNONAA >60 03/08/2018   GFRAA >60 03/08/2018    Lab Results  Component Value Date   WBC 7.6 03/27/2018   NEUTROABS 4.1 03/27/2018   HGB 14.4 03/27/2018   HCT 42.1 03/27/2018   MCV  96.9 03/27/2018   PLT 151 03/27/2018     STUDIES: No results found.  ASSESSMENT: Autoimmune thrombocytopenia.  PLAN:    1. Autoimmune thrombocytopenia: Patient's platelet antibodies are positive.  She also had a positive ANA, which was evaluated by rheumatology and determined to be clinically insignificant.  Patient completed 4 cycles of weekly Rituxan on Jan 17, 2014. The remainder of her laboratory work was either negative or within normal limits. She does not have splenomegaly.  Patient's platelet count is within normal limits today at 151.  No further intervention is needed.  Return to clinic in 6 months with repeat laboratory work only and then in 1 year for further evaluation.  If her platelet count remains within normal limits at that time, can consider discharging from clinic and have primary care monitor for recurrence.   2. Hypertension: Patient blood pressure is mildly elevated today.  Continue evaluation and treatment per primary care.  Patient expressed understanding and was in  agreement with this plan. She also understands that She can call clinic at any time with any questions, concerns, or complaints.    Lloyd Huger, MD   03/31/2018 8:21 AM

## 2018-03-27 ENCOUNTER — Encounter: Payer: Self-pay | Admitting: Oncology

## 2018-03-27 ENCOUNTER — Other Ambulatory Visit: Payer: Self-pay | Admitting: *Deleted

## 2018-03-27 ENCOUNTER — Inpatient Hospital Stay (HOSPITAL_BASED_OUTPATIENT_CLINIC_OR_DEPARTMENT_OTHER): Payer: Medicare Other | Admitting: Oncology

## 2018-03-27 ENCOUNTER — Inpatient Hospital Stay: Payer: Medicare Other | Attending: Oncology

## 2018-03-27 VITALS — BP 145/94 | HR 85 | Temp 96.9°F | Resp 18 | Wt 167.2 lb

## 2018-03-27 DIAGNOSIS — D696 Thrombocytopenia, unspecified: Secondary | ICD-10-CM

## 2018-03-27 DIAGNOSIS — Z87442 Personal history of urinary calculi: Secondary | ICD-10-CM

## 2018-03-27 DIAGNOSIS — Z79899 Other long term (current) drug therapy: Secondary | ICD-10-CM | POA: Insufficient documentation

## 2018-03-27 DIAGNOSIS — F17210 Nicotine dependence, cigarettes, uncomplicated: Secondary | ICD-10-CM | POA: Insufficient documentation

## 2018-03-27 DIAGNOSIS — D693 Immune thrombocytopenic purpura: Secondary | ICD-10-CM

## 2018-03-27 DIAGNOSIS — Z85820 Personal history of malignant melanoma of skin: Secondary | ICD-10-CM

## 2018-03-27 DIAGNOSIS — I10 Essential (primary) hypertension: Secondary | ICD-10-CM | POA: Insufficient documentation

## 2018-03-27 DIAGNOSIS — Z8619 Personal history of other infectious and parasitic diseases: Secondary | ICD-10-CM | POA: Insufficient documentation

## 2018-03-27 DIAGNOSIS — M1990 Unspecified osteoarthritis, unspecified site: Secondary | ICD-10-CM | POA: Insufficient documentation

## 2018-03-27 LAB — CBC WITH DIFFERENTIAL/PLATELET
BASOS ABS: 0 10*3/uL (ref 0–0.1)
Basophils Relative: 1 %
EOS PCT: 1 %
Eosinophils Absolute: 0.1 10*3/uL (ref 0–0.7)
HEMATOCRIT: 42.1 % (ref 35.0–47.0)
Hemoglobin: 14.4 g/dL (ref 12.0–16.0)
LYMPHS PCT: 35 %
Lymphs Abs: 2.7 10*3/uL (ref 1.0–3.6)
MCH: 33.1 pg (ref 26.0–34.0)
MCHC: 34.1 g/dL (ref 32.0–36.0)
MCV: 96.9 fL (ref 80.0–100.0)
MONO ABS: 0.7 10*3/uL (ref 0.2–0.9)
MONOS PCT: 9 %
NEUTROS ABS: 4.1 10*3/uL (ref 1.4–6.5)
Neutrophils Relative %: 54 %
PLATELETS: 151 10*3/uL (ref 150–440)
RBC: 4.35 MIL/uL (ref 3.80–5.20)
RDW: 12.6 % (ref 11.5–14.5)
WBC: 7.6 10*3/uL (ref 3.6–11.0)

## 2018-03-27 NOTE — Progress Notes (Signed)
Pt in for yearly follow up, denies any concerns or difficulties today.

## 2018-04-02 ENCOUNTER — Ambulatory Visit (INDEPENDENT_AMBULATORY_CARE_PROVIDER_SITE_OTHER): Payer: Medicare Other | Admitting: Adult Health

## 2018-04-02 ENCOUNTER — Encounter: Payer: Self-pay | Admitting: Adult Health

## 2018-04-02 DIAGNOSIS — I10 Essential (primary) hypertension: Secondary | ICD-10-CM | POA: Diagnosis not present

## 2018-04-02 DIAGNOSIS — Z1329 Encounter for screening for other suspected endocrine disorder: Secondary | ICD-10-CM

## 2018-04-02 DIAGNOSIS — F17210 Nicotine dependence, cigarettes, uncomplicated: Secondary | ICD-10-CM

## 2018-04-02 DIAGNOSIS — E663 Overweight: Secondary | ICD-10-CM | POA: Diagnosis not present

## 2018-04-02 DIAGNOSIS — R319 Hematuria, unspecified: Secondary | ICD-10-CM | POA: Diagnosis not present

## 2018-04-02 DIAGNOSIS — R300 Dysuria: Secondary | ICD-10-CM

## 2018-04-02 DIAGNOSIS — N390 Urinary tract infection, site not specified: Secondary | ICD-10-CM

## 2018-04-02 LAB — POCT URINALYSIS DIPSTICK
BILIRUBIN UA: NEGATIVE
GLUCOSE UA: NEGATIVE
Ketones, UA: NEGATIVE
LEUKOCYTES UA: NEGATIVE
Nitrite, UA: NEGATIVE
Protein, UA: NEGATIVE
SPEC GRAV UA: 1.015 (ref 1.010–1.025)
Urobilinogen, UA: 0.2 E.U./dL
pH, UA: 5 (ref 5.0–8.0)

## 2018-04-02 MED ORDER — NITROFURANTOIN MONOHYD MACRO 100 MG PO CAPS: 100.0000 mg | ORAL_CAPSULE | Freq: Two times a day (BID) | ORAL | 0 refills | Status: DC

## 2018-04-02 NOTE — Patient Instructions (Signed)

## 2018-04-02 NOTE — Progress Notes (Signed)
[facility] [place], [place] [address]  Internal MEDICINE  Office Visit Note  Patient Name: Angie [name]  [id number]  [id number]  Date of Service: 04/17/2018  Chief Complaint  Patient presents with  . Medical Management of Chronic Issues    weight loss   . Urinary Tract Infection    HPI  Pt here for follow up to weight management and also thinks she may have a UTI.  She describes symptoms of burning, stinging, and frequency.  She denies HTN, headaches, palpitations, sob or other side effects of phentermine. Pt would also like to review her lab results.    Current Medication: Outpatient Encounter Medications as of 04/02/2018  Medication Sig  . Biotin 1 MG CAPS Take 1,000 mcg by mouth daily.  . cyclobenzaprine (FLEXERIL) 10 MG tablet Take 10 mg by mouth as needed for muscle spasms.  . hydrocortisone (ANUSOL-HC) 25 MG suppository Place 1 suppository (25 mg total) rectally 2 (two) times daily.  . hydrocortisone (PROCTOSOL HC) 2.5 % rectal cream Place 1 application rectally 2 (two) times daily.  Marland Kitchen lisinopril-hydrochlorothiazide (PRINZIDE,ZESTORETIC) 10-12.5 MG tablet Take 1 tab po daily  . pantoprazole (PROTONIX) 40 MG tablet   . phentermine (ADIPEX-P) 37.5 MG tablet Take 1 tablet (37.5 mg total) by mouth daily before breakfast.  . traMADol (ULTRAM) 50 MG tablet Take 2 tablets (100 mg total) by mouth 2 (two) times daily.  . varenicline (CHANTIX STARTING MONTH PAK) 0.5 MG X 11 & 1 MG X 42 tablet Take by mouth as directed  . nitrofurantoin, macrocrystal-monohydrate, (MACROBID) 100 MG capsule Take 1 capsule (100 mg total) by mouth 2 (two) times daily.   No facility-administered encounter medications on file as of 04/02/2018.     Surgical History: Past Surgical History:  Procedure Laterality Date  . COLONOSCOPY WITH PROPOFOL N/A 10/04/2016   Procedure: COLONOSCOPY WITH PROPOFOL;  Surgeon: Lollie Sails, MD;  Location: [facility] ENDOSCOPY;  Service:  Endoscopy;  Laterality: N/A;  . ESOPHAGOGASTRODUODENOSCOPY (EGD) WITH PROPOFOL N/A 05/13/2016   Procedure: ESOPHAGOGASTRODUODENOSCOPY (EGD) WITH PROPOFOL;  Surgeon: Lollie Sails, MD;  Location: [facility] ENDOSCOPY;  Service: Endoscopy;  Laterality: N/A;  . JOINT REPLACEMENT Left 2015  . TONSILLECTOMY    . TONSILLECTOMY    . TOTAL KNEE ARTHROPLASTY      Medical History: Past Medical History:  Diagnosis Date  . Arthritis   . Hepatitis C 2014   treated with Harvoni  . Hypertension   . Kidney stone   . Melanoma ([place])   . Osteoarthritis   . Sepsis due to urinary tract infection ([place]) 2014  . Thrombocytopenia, primary (HCC)    Dr Grayland Ormond    Family History: Family History  Problem Relation Age of Onset  . Liver cancer Father   . Cancer Father   . COPD Father   . Heart disease Father   . COPD Mother   . Hypertension Mother   . Diabetes Sister   . Diabetes Paternal Grandmother   . Heart disease Paternal Grandmother   . Hypertension Paternal Grandmother   . Diabetes Paternal Grandfather   . Heart disease Paternal Grandfather   . Hypertension Paternal Grandfather     Social History   Socioeconomic History  . Marital status: Single    Spouse name: Not on file  . Number of children: Not on file  . Years of education: Not on file  . Highest education level: Not on file  Occupational History  . Not on file  Social Needs  . Financial resource strain: Not on file  . Food insecurity:    Worry: Not on file    Inability: Not on file  . Transportation needs:    Medical: Not on file    Non-medical: Not on file  Tobacco Use  . Smoking status: Current Some Day Smoker    Types: Cigarettes  . Smokeless tobacco: Never Used  Substance and Sexual Activity  . Alcohol use: Yes    Alcohol/week: 1.0 standard drinks    Types: 1 Glasses of wine per week    Comment: occasionally last week  . Drug use: No  . Sexual activity: Yes    Birth control/protection: None, Post-menopausal   Lifestyle  . Physical activity:    Days per week: Not on file    Minutes per session: Not on file  . Stress: Not on file  Relationships  . Social connections:    Talks on phone: Not on file    Gets together: Not on file    Attends religious service: Not on file    Active member of club or organization: Not on file    Attends meetings of clubs or organizations: Not on file    Relationship status: Not on file  . Intimate partner violence:    Fear of current or ex partner: Not on file    Emotionally abused: Not on file    Physically abused: Not on file    Forced sexual activity: Not on file  Other Topics Concern  . Not on file  Social History Narrative  . Not on file      Review of Systems  Constitutional: Negative for chills, fatigue and unexpected weight change.  HENT: Negative for congestion, rhinorrhea, sneezing and sore throat.   Eyes: Negative for photophobia, pain and redness.  Respiratory: Negative for cough, chest tightness and shortness of breath.   Cardiovascular: Negative for chest pain and palpitations.  Gastrointestinal: Negative for abdominal pain, constipation, diarrhea, nausea and vomiting.  Endocrine: Negative.   Genitourinary: Negative for dysuria and frequency.  Musculoskeletal: Negative for arthralgias, back pain, joint swelling and neck pain.  Skin: Negative for rash.  Allergic/Immunologic: Negative.   Neurological: Negative for tremors and numbness.  Hematological: Negative for adenopathy. Does not bruise/bleed easily.  Psychiatric/Behavioral: Negative for behavioral problems and sleep disturbance. The patient is not nervous/anxious.     Vital Signs: BP 110/68   Pulse 95   Resp 16   Ht 5\' 5"  (1.651 m)   Wt 166 lb 9.6 oz (75.6 kg)   SpO2 97%   BMI 27.72 kg/m    Physical Exam  Constitutional: She is oriented to person, place, and time. She appears well-developed and well-nourished. No distress.  HENT:  Head: Normocephalic and atraumatic.   Mouth/Throat: Oropharynx is clear and moist. No oropharyngeal exudate.  Eyes: Pupils are equal, round, and reactive to light. EOM are normal.  Neck: Normal range of motion. Neck supple. No JVD present. No tracheal deviation present. No thyromegaly present.  Cardiovascular: Normal rate, regular rhythm and normal heart sounds. Exam reveals no gallop and no friction rub.  No murmur heard. Pulmonary/Chest: Effort normal and breath sounds normal. No respiratory distress. She has no wheezes. She has no rales. She exhibits no tenderness.  Abdominal: Soft. There is no tenderness. There is no guarding.  Musculoskeletal: Normal range of motion.  Lymphadenopathy:    She has no cervical adenopathy.  Neurological: She is alert and oriented to person, place, and time. No  cranial nerve deficit.  Skin: Skin is warm and dry. She is not diaphoretic.  Psychiatric: She has a normal mood and affect. Her behavior is normal. Judgment and thought content normal.  Nursing note and vitals reviewed.   Assessment/Plan: 1. Overweight Obesity Counseling: Risk Assessment: An assessment of behavioral risk factors was made today and includes lack of exercise sedentary lifestyle, lack of portion control and poor dietary habits.  Risk Modification Advice: She was counseled on portion control guidelines. Restricting daily caloric intake to. . The detrimental long term effects of obesity on her health and ongoing poor compliance was also discussed with the patient.  There is a liability release in patients' chart. There has been a 10 minute discussion about the side effects including but not limited to elevated blood pressure, anxiety, lack of sleep and dry mouth. Pt understands and will like to start/continue on appetite suppressant at this time. There will be one month RX given at the time of visit with proper follow up. Nova diet plan with restricted calories is given to the pt. Pt understands and agrees with  plan of  treatment 2. Essential hypertension Controlled on current therapy. Continue as prescribed.  3. Cigarette smoker Smoking cessation counseling: 1. Pt acknowledges the risks of long term smoking, she will try to quite smoking. 2. Options for different medications including nicotine products, chewing gum, patch etc, Wellbutrin and Chantix is discussed 3. Goal and date of compete cessation is discussed 4. Total time spent in smoking cessation is 15 min.   4. Screening for thyroid disorder Thyroid US ordered.   Thyroid antibodies ordered.   5. Dysuria Urine culture sent, Dip only positive for blood.  Started patient on Macrobid awaiting culture results.  - POCT Urinalysis Dipstick  General Counseling: chinara hertzberg understanding of the findings of todays visit and agrees with plan of treatment. I have discussed any further diagnostic evaluation that may be needed or ordered today. We also reviewed her medications today. she has been encouraged to call the office with any questions or concerns that should arise related to todays visit.    Orders Placed This Encounter  Procedures  . CULTURE, URINE COMPREHENSIVE  . US THYROID  . POCT Urinalysis Dipstick    Meds ordered this encounter  Medications  . nitrofurantoin, macrocrystal-monohydrate, (MACROBID) 100 MG capsule    Sig: Take 1 capsule (100 mg total) by mouth 2 (two) times daily.    Dispense:  14 capsule    Refill:  0    Time spent: 25 Minutes   This patient was seen by Orson Gear AGNP-C in Collaboration with Dr Lavera Guise as a part of collaborative care agreement    Dr Lavera Guise Internal medicine

## 2018-04-04 LAB — CULTURE, URINE COMPREHENSIVE

## 2018-04-09 ENCOUNTER — Other Ambulatory Visit
Admission: RE | Admit: 2018-04-09 | Discharge: 2018-04-09 | Disposition: A | Discharge status: Home / Self Care | Payer: Medicare Other | Source: Ambulatory Visit | Attending: Internal Medicine | Admitting: Internal Medicine

## 2018-04-09 DIAGNOSIS — R946 Abnormal results of thyroid function studies: Secondary | ICD-10-CM | POA: Insufficient documentation

## 2018-04-10 LAB — THYROID ANTIBODIES: THYROID PEROXIDASE ANTIBODY: 16 [IU]/mL (ref 0–34)

## 2018-04-13 ENCOUNTER — Ambulatory Visit: Payer: Medicare Other

## 2018-04-13 DIAGNOSIS — Z1329 Encounter for screening for other suspected endocrine disorder: Secondary | ICD-10-CM

## 2018-04-13 DIAGNOSIS — I10 Essential (primary) hypertension: Secondary | ICD-10-CM | POA: Diagnosis not present

## 2018-04-18 ENCOUNTER — Encounter: Payer: Self-pay | Admitting: Adult Health

## 2018-04-18 ENCOUNTER — Ambulatory Visit (INDEPENDENT_AMBULATORY_CARE_PROVIDER_SITE_OTHER): Payer: Medicare Other | Admitting: Adult Health

## 2018-04-18 VITALS — BP 133/79 | HR 88 | Resp 16 | Ht 65.5 in | Wt 167.8 lb

## 2018-04-18 DIAGNOSIS — F17210 Nicotine dependence, cigarettes, uncomplicated: Secondary | ICD-10-CM

## 2018-04-18 DIAGNOSIS — I10 Essential (primary) hypertension: Secondary | ICD-10-CM | POA: Diagnosis not present

## 2018-04-18 DIAGNOSIS — E079 Disorder of thyroid, unspecified: Secondary | ICD-10-CM | POA: Diagnosis not present

## 2018-04-18 NOTE — Progress Notes (Signed)
[facility] [place], [place] [address]  Internal MEDICINE  Office Visit Note  Patient Name: Angie [name]  [id number]  [id number]  Date of Service: 05/06/2018  Chief Complaint  Patient presents with  . Hypertension  . Follow-up    U/S and labs     HPI  Pt here for follow up on HTN and thyroid US.  She denies issues or new complaints since last visit. She is genernally feeling well. Her thyroid US shows that both lobes of the thyroid gland demonstrates scattered small cystic nodules measuring no more than 2 millimeters in maximum dimension. Her thyroid antibodies were also negative. She denies overt symptoms at this time.      Current Medication: Outpatient Encounter Medications as of 04/18/2018  Medication Sig  . Biotin 1 MG CAPS Take 1,000 mcg by mouth daily.  . cyclobenzaprine (FLEXERIL) 10 MG tablet Take 10 mg by mouth as needed for muscle spasms.  . hydrocortisone (ANUSOL-HC) 25 MG suppository Place 1 suppository (25 mg total) rectally 2 (two) times daily.  . hydrocortisone (PROCTOSOL HC) 2.5 % rectal cream Place 1 application rectally 2 (two) times daily.  Marland Kitchen lisinopril-hydrochlorothiazide (PRINZIDE,ZESTORETIC) 10-12.5 MG tablet Take 1 tab po daily  . nitrofurantoin, macrocrystal-monohydrate, (MACROBID) 100 MG capsule Take 1 capsule (100 mg total) by mouth 2 (two) times daily.  . pantoprazole (PROTONIX) 40 MG tablet   . phentermine (ADIPEX-P) 37.5 MG tablet Take 1 tablet (37.5 mg total) by mouth daily before breakfast.  . traMADol (ULTRAM) 50 MG tablet Take 2 tablets (100 mg total) by mouth 2 (two) times daily.  . varenicline (CHANTIX STARTING MONTH PAK) 0.5 MG X 11 & 1 MG X 42 tablet Take by mouth as directed   No facility-administered encounter medications on file as of 04/18/2018.     Surgical History: Past Surgical History:  Procedure Laterality Date  . COLONOSCOPY WITH PROPOFOL N/A 10/04/2016   Procedure: COLONOSCOPY WITH PROPOFOL;   Surgeon: Lollie Sails, MD;  Location: [facility] ENDOSCOPY;  Service: Endoscopy;  Laterality: N/A;  . ESOPHAGOGASTRODUODENOSCOPY (EGD) WITH PROPOFOL N/A 05/13/2016   Procedure: ESOPHAGOGASTRODUODENOSCOPY (EGD) WITH PROPOFOL;  Surgeon: Lollie Sails, MD;  Location: [facility] ENDOSCOPY;  Service: Endoscopy;  Laterality: N/A;  . JOINT REPLACEMENT Left 2015  . TONSILLECTOMY    . TONSILLECTOMY    . TOTAL KNEE ARTHROPLASTY      Medical History: Past Medical History:  Diagnosis Date  . Arthritis   . Hepatitis C 2014   treated with Harvoni  . Hypertension   . Kidney stone   . Melanoma ([place])   . Osteoarthritis   . Sepsis due to urinary tract infection ([place]) 2014  . Thrombocytopenia, primary (HCC)    Dr Grayland Ormond    Family History: Family History  Problem Relation Age of Onset  . Liver cancer Father   . Cancer Father   . COPD Father   . Heart disease Father   . COPD Mother   . Hypertension Mother   . Diabetes Sister   . Diabetes Paternal Grandmother   . Heart disease Paternal Grandmother   . Hypertension Paternal Grandmother   . Diabetes Paternal Grandfather   . Heart disease Paternal Grandfather   . Hypertension Paternal Grandfather     Social History   Socioeconomic History  . Marital status: Single    Spouse name: Not on file  . Number of children: Not on file  . Years of education: Not on file  . Highest education  level: Not on file  Occupational History  . Not on file  Social Needs  . Financial resource strain: Not on file  . Food insecurity:    Worry: Not on file    Inability: Not on file  . Transportation needs:    Medical: Not on file    Non-medical: Not on file  Tobacco Use  . Smoking status: Current Some Day Smoker    Types: Cigarettes  . Smokeless tobacco: Never Used  Substance and Sexual Activity  . Alcohol use: Yes    Alcohol/week: 1.0 standard drinks    Types: 1 Glasses of wine per week    Comment: occasionally last week  . Drug use: No  . Sexual  activity: Yes    Birth control/protection: None, Post-menopausal  Lifestyle  . Physical activity:    Days per week: Not on file    Minutes per session: Not on file  . Stress: Not on file  Relationships  . Social connections:    Talks on phone: Not on file    Gets together: Not on file    Attends religious service: Not on file    Active member of club or organization: Not on file    Attends meetings of clubs or organizations: Not on file    Relationship status: Not on file  . Intimate partner violence:    Fear of current or ex partner: Not on file    Emotionally abused: Not on file    Physically abused: Not on file    Forced sexual activity: Not on file  Other Topics Concern  . Not on file  Social History Narrative  . Not on file      Review of Systems  Constitutional: Negative for chills, fatigue and unexpected weight change.  HENT: Negative for congestion, rhinorrhea, sneezing and sore throat.   Eyes: Negative for photophobia, pain and redness.  Respiratory: Negative for cough, chest tightness and shortness of breath.   Cardiovascular: Negative for chest pain and palpitations.  Gastrointestinal: Negative for abdominal pain, constipation, diarrhea, nausea and vomiting.  Endocrine: Negative.   Genitourinary: Negative for dysuria and frequency.  Musculoskeletal: Negative for arthralgias, back pain, joint swelling and neck pain.  Skin: Negative for rash.  Allergic/Immunologic: Negative.   Neurological: Negative for tremors and numbness.  Hematological: Negative for adenopathy. Does not bruise/bleed easily.  Psychiatric/Behavioral: Negative for behavioral problems and sleep disturbance. The patient is not nervous/anxious.     Vital Signs: BP 133/79   Pulse 88   Resp 16   Ht 5' 5.5" (1.664 m)   Wt 167 lb 12.8 oz (76.1 kg)   SpO2 94%   BMI 27.50 kg/m    Physical Exam  Constitutional: She is oriented to person, place, and time. She appears well-developed and  well-nourished. No distress.  HENT:  Head: Normocephalic and atraumatic.  Mouth/Throat: Oropharynx is clear and moist. No oropharyngeal exudate.  Eyes: Pupils are equal, round, and reactive to light. EOM are normal.  Neck: Normal range of motion. Neck supple. No JVD present. No tracheal deviation present. No thyromegaly present.  Cardiovascular: Normal rate, regular rhythm and normal heart sounds. Exam reveals no gallop and no friction rub.  No murmur heard. Pulmonary/Chest: Effort normal and breath sounds normal. No respiratory distress. She has no wheezes. She has no rales. She exhibits no tenderness.  Abdominal: Soft. There is no tenderness. There is no guarding.  Musculoskeletal: Normal range of motion.  Lymphadenopathy:    She has no cervical adenopathy.  Neurological: She is alert and oriented to person, place, and time. No cranial nerve deficit.  Skin: Skin is warm and dry. She is not diaphoretic.  Psychiatric: She has a normal mood and affect. Her behavior is normal. Judgment and thought content normal.  Nursing note and vitals reviewed.  Assessment/Plan: 1. Thyroid dysfunction Will redraw TSH Free T4, and Free T3 at next visit.   2. Essential hypertension Controlled, continue current medication regimen.  3. Cigarette smoker Smoking cessation counseling: 1. Pt acknowledges the risks of long term smoking, she will try to quite smoking. 2. Options for different medications including nicotine products, chewing gum, patch etc, Wellbutrin and Chantix is discussed 3. Goal and date of compete cessation is discussed 4. Total time spent in smoking cessation is 10 min.   General Counseling: Angie [name] understanding of the findings of todays visit and agrees with plan of treatment. I have discussed any further diagnostic evaluation that may be needed or ordered today. We also reviewed her medications today. she has been encouraged to call the office with any questions or concerns  that should arise related to todays visit.  Time spent: 25 Minutes   This patient was seen by Orson Gear AGNP-C in Collaboration with Dr Lavera Guise as a part of collaborative care agreement    Dr Lavera Guise Internal medicine

## 2018-04-20 ENCOUNTER — Telehealth: Payer: Self-pay | Admitting: Adult Health

## 2018-04-20 NOTE — Telephone Encounter (Signed)
Left message for patient to call back and discuss ultrasound results with Angie [name]. [name]

## 2018-05-01 ENCOUNTER — Ambulatory Visit: Admit: 2018-05-01 | Payer: Medicare Other | Admitting: Gastroenterology

## 2018-05-01 SURGERY — ESOPHAGOGASTRODUODENOSCOPY (EGD) WITH PROPOFOL
Anesthesia: General

## 2018-06-04 ENCOUNTER — Other Ambulatory Visit: Payer: Self-pay | Admitting: Adult Health

## 2018-06-11 DIAGNOSIS — Z23 Encounter for immunization: Secondary | ICD-10-CM | POA: Diagnosis not present

## 2018-06-13 ENCOUNTER — Other Ambulatory Visit: Payer: Self-pay

## 2018-06-13 DIAGNOSIS — F17210 Nicotine dependence, cigarettes, uncomplicated: Secondary | ICD-10-CM

## 2018-06-13 MED ORDER — VARENICLINE TARTRATE 0.5 MG X 11 & 1 MG X 42 PO MISC: ORAL | 0 refills | Status: DC

## 2018-06-19 ENCOUNTER — Other Ambulatory Visit: Payer: Self-pay | Admitting: Nurse Practitioner

## 2018-06-20 ENCOUNTER — Other Ambulatory Visit: Payer: Self-pay | Admitting: Nurse Practitioner

## 2018-06-20 DIAGNOSIS — E663 Overweight: Secondary | ICD-10-CM

## 2018-06-20 MED ORDER — VARENICLINE TARTRATE 1 MG PO TABS: 1.0000 mg | ORAL_TABLET | Freq: Two times a day (BID) | ORAL | 0 refills | Status: DC

## 2018-06-26 ENCOUNTER — Ambulatory Visit: Admission: RE | Admit: 2018-06-26 | Payer: Medicare Other | Source: Ambulatory Visit | Admitting: Gastroenterology

## 2018-06-26 ENCOUNTER — Encounter: Admission: RE | Payer: Self-pay | Source: Ambulatory Visit

## 2018-06-26 SURGERY — ESOPHAGOGASTRODUODENOSCOPY (EGD) WITH PROPOFOL
Anesthesia: General

## 2018-08-03 ENCOUNTER — Other Ambulatory Visit: Payer: Self-pay | Admitting: Nurse Practitioner

## 2018-08-30 ENCOUNTER — Other Ambulatory Visit: Payer: Self-pay | Admitting: Gastroenterology

## 2018-08-30 DIAGNOSIS — K7460 Unspecified cirrhosis of liver: Secondary | ICD-10-CM | POA: Diagnosis not present

## 2018-08-30 DIAGNOSIS — K210 Gastro-esophageal reflux disease with esophagitis: Secondary | ICD-10-CM | POA: Diagnosis not present

## 2018-09-04 ENCOUNTER — Ambulatory Visit
Admission: RE | Admit: 2018-09-04 | Discharge: 2018-09-04 | Disposition: A | Discharge status: Home / Self Care | Payer: Medicare Other | Source: Ambulatory Visit | Attending: Gastroenterology | Admitting: Gastroenterology

## 2018-09-04 DIAGNOSIS — K7460 Unspecified cirrhosis of liver: Secondary | ICD-10-CM | POA: Insufficient documentation

## 2018-09-07 ENCOUNTER — Encounter: Payer: Self-pay | Admitting: Nurse Practitioner

## 2018-09-07 ENCOUNTER — Ambulatory Visit (INDEPENDENT_AMBULATORY_CARE_PROVIDER_SITE_OTHER): Payer: Medicare Other | Admitting: Nurse Practitioner

## 2018-09-07 VITALS — BP 137/70 | HR 96 | Resp 16 | Ht 65.5 in | Wt 171.2 lb

## 2018-09-07 DIAGNOSIS — M5441 Lumbago with sciatica, right side: Secondary | ICD-10-CM

## 2018-09-07 DIAGNOSIS — E663 Overweight: Secondary | ICD-10-CM | POA: Diagnosis not present

## 2018-09-07 DIAGNOSIS — Z124 Encounter for screening for malignant neoplasm of cervix: Secondary | ICD-10-CM | POA: Diagnosis not present

## 2018-09-07 DIAGNOSIS — I10 Essential (primary) hypertension: Secondary | ICD-10-CM | POA: Diagnosis not present

## 2018-09-07 DIAGNOSIS — R300 Dysuria: Secondary | ICD-10-CM

## 2018-09-07 DIAGNOSIS — Z1239 Encounter for other screening for malignant neoplasm of breast: Secondary | ICD-10-CM

## 2018-09-07 DIAGNOSIS — Z0001 Encounter for general adult medical examination with abnormal findings: Secondary | ICD-10-CM | POA: Diagnosis not present

## 2018-09-07 DIAGNOSIS — G8929 Other chronic pain: Secondary | ICD-10-CM

## 2018-09-07 DIAGNOSIS — M5442 Lumbago with sciatica, left side: Secondary | ICD-10-CM

## 2018-09-07 MED ORDER — LISINOPRIL-HYDROCHLOROTHIAZIDE 10-12.5 MG PO TABS: ORAL_TABLET | ORAL | 5 refills | Status: DC

## 2018-09-07 MED ORDER — PHENTERMINE HCL 37.5 MG PO TABS: 37.5000 mg | ORAL_TABLET | Freq: Every day | ORAL | 1 refills | Status: DC

## 2018-09-07 MED ORDER — TRAMADOL HCL 50 MG PO TABS: 100.0000 mg | ORAL_TABLET | Freq: Two times a day (BID) | ORAL | 3 refills | Status: DC

## 2018-09-07 NOTE — Progress Notes (Signed)
[facility] [name], [place] [address]  Internal MEDICINE  Office Visit Note  Patient Name: MARKALA SITTS  [id number]  [id number]  Date of Service: 09/16/2018   Pt is here for routine health maintenance examination  Chief Complaint  Patient presents with  . Medicare Wellness    physical, pt wants to set up mammogram she is concerned about a spot of tenderness on the right breast  . Gynecologic Exam  . Labs Only    9wk follow up review labs     The patient has gained 5 pounds since her last visit. Has been off of weight loss medications for past several weeks. did well on this medication and would like to restart.  She continues to use tramadol as needed for pain related to her hip and generalized arthritic pain. She does need to have a refill for this as well.  She is doing well. Blood pressure well controlled. Due for pap smear and screening mammogram.     Current Medication: Outpatient Encounter Medications as of 09/07/2018  Medication Sig  . Biotin 1 MG CAPS Take 1,000 mcg by mouth daily.  . cyclobenzaprine (FLEXERIL) 10 MG tablet Take 10 mg by mouth as needed for muscle spasms.  . hydrocortisone (ANUSOL-HC) 25 MG suppository Place 1 suppository (25 mg total) rectally 2 (two) times daily.  . hydrocortisone (PROCTOSOL HC) 2.5 % rectal cream Place 1 application rectally 2 (two) times daily.  Marland Kitchen lisinopril-hydrochlorothiazide (PRINZIDE,ZESTORETIC) 10-12.5 MG tablet Take 1 tab po daily  . nitrofurantoin, macrocrystal-monohydrate, (MACROBID) 100 MG capsule Take 1 capsule (100 mg total) by mouth 2 (two) times daily.  . pantoprazole (PROTONIX) 40 MG tablet   . traMADol (ULTRAM) 50 MG tablet Take 2 tablets (100 mg total) by mouth 2 (two) times daily.  . varenicline (CHANTIX) 1 MG tablet Take 1 tablet (1 mg total) by mouth 2 (two) times daily.  . [DISCONTINUED] lisinopril-hydrochlorothiazide (PRINZIDE,ZESTORETIC) 10-12.5 MG tablet Take 1 tab po daily  .  [DISCONTINUED] phentermine (ADIPEX-P) 37.5 MG tablet Take 1 tablet (37.5 mg total) by mouth daily before breakfast.  . [DISCONTINUED] traMADol (ULTRAM) 50 MG tablet Take 2 tablets (100 mg total) by mouth 2 (two) times daily.  . phentermine (ADIPEX-P) 37.5 MG tablet Take 1 tablet (37.5 mg total) by mouth daily before breakfast.   No facility-administered encounter medications on file as of 09/07/2018.     Surgical History: Past Surgical History:  Procedure Laterality Date  . COLONOSCOPY WITH PROPOFOL N/A 10/04/2016   Procedure: COLONOSCOPY WITH PROPOFOL;  Surgeon: Lollie Sails, MD;  Location: [facility] ENDOSCOPY;  Service: Endoscopy;  Laterality: N/A;  . ESOPHAGOGASTRODUODENOSCOPY (EGD) WITH PROPOFOL N/A 05/13/2016   Procedure: ESOPHAGOGASTRODUODENOSCOPY (EGD) WITH PROPOFOL;  Surgeon: Lollie Sails, MD;  Location[facility] ENDOSCOPY;  Service: Endoscopy;  Laterality: N/A;  . JOINT REPLACEMENT Left 2015  . TONSILLECTOMY    . TONSILLECTOMY    . TOTAL KNEE ARTHROPLASTY      Medical History: Past Medical History:  Diagnosis Date  . Arthritis   . Hepatitis C 2014   treated with Harvoni  . Hypertension   . Kidney stone   . Melanoma ([place])   . Osteoarthritis   . Sepsis due to urinary tract infection ([place]) 2014  . Thrombocytopenia, primary ([facility])    Dr Grayland Ormond    Family History: Family History  Problem Relation Age of Onset  . Liver cancer Father   . Cancer Father   . COPD Father   .  Heart disease Father   . COPD Mother   . Hypertension Mother   . Diabetes Sister   . Diabetes Paternal Grandmother   . Heart disease Paternal Grandmother   . Hypertension Paternal Grandmother   . Diabetes Paternal Grandfather   . Heart disease Paternal Grandfather   . Hypertension Paternal Grandfather       Review of Systems  Constitutional: Negative for activity change, appetite change, fatigue, fever and unexpected weight change.       Five pound weight gain since most recent visit.   HENT:  Negative for congestion, ear pain, sinus pressure, sore throat and voice change.   Respiratory: Negative for cough, chest tightness, shortness of breath and wheezing.   Cardiovascular: Negative for chest pain and palpitations.  Gastrointestinal: Negative for abdominal distention, constipation, diarrhea, nausea and vomiting.  Endocrine: Negative for cold intolerance, heat intolerance, polydipsia and polyuria.  Genitourinary: Negative for flank pain, frequency and pelvic pain.  Musculoskeletal: Positive for arthralgias, back pain and myalgias.  Skin: Negative for color change, pallor, rash and wound.  Allergic/Immunologic: Positive for environmental allergies. Negative for food allergies and immunocompromised state.  Neurological: Positive for headaches. Negative for dizziness and weakness.  Hematological: Negative for adenopathy.  Psychiatric/Behavioral: Negative for behavioral problems. The patient is not nervous/anxious.      Today's Vitals   09/07/18 1413  BP: 137/70  Pulse: 96  Resp: 16  SpO2: 98%  Weight: 171 lb 3.2 oz (77.7 kg)  Height: 5' 5.5" (1.664 m)   Body mass index is 28.06 kg/m.  Physical Exam Vitals signs and nursing note reviewed.  Constitutional:      General: She is not in acute distress.    Appearance: Normal appearance. She is well-developed. She is not diaphoretic.  HENT:     Head: Normocephalic and atraumatic.     Mouth/Throat:     Pharynx: No oropharyngeal exudate.  Eyes:     Conjunctiva/sclera: Conjunctivae normal.     Pupils: Pupils are equal, round, and reactive to light.  Neck:     Musculoskeletal: Normal range of motion and neck supple.     Thyroid: No thyromegaly.     Vascular: No carotid bruit or JVD.     Trachea: No tracheal deviation.  Cardiovascular:     Rate and Rhythm: Normal rate and regular rhythm.     Pulses: Normal pulses.     Heart sounds: Normal heart sounds. No murmur. No friction rub. No gallop.   Pulmonary:     Effort:  Pulmonary effort is normal. No respiratory distress.     Breath sounds: Normal breath sounds. No wheezing or rales.  Chest:     Chest wall: No tenderness.     Breasts:        Right: Normal. No swelling, bleeding, inverted nipple, mass, nipple discharge, skin change or tenderness.        Left: Normal. No swelling, bleeding, inverted nipple, mass, nipple discharge, skin change or tenderness.  Abdominal:     General: Bowel sounds are normal.     Palpations: Abdomen is soft.     Tenderness: There is no abdominal tenderness.  Genitourinary:    General: Normal vulva.     Exam position: Supine.     Vagina: Normal.     Cervix: No cervical motion tenderness, discharge or lesion.     Uterus: Normal.      Adnexa: Right adnexa normal and left adnexa normal.     Comments: No tenderness, masses, or organomeglay  present during bimanual exam . Musculoskeletal: Normal range of motion.  Lymphadenopathy:     Cervical: No cervical adenopathy.  Skin:    General: Skin is warm and dry.  Neurological:     Mental Status: She is alert and oriented to person, place, and time.     Cranial Nerves: No cranial nerve deficit.  Psychiatric:        Behavior: Behavior normal.        Thought Content: Thought content normal.        Judgment: Judgment normal.    Depression screen [facility] 2/9 09/07/2018 09/07/2018 02/19/2018 12/12/2017 09/22/2017  Decreased Interest 0 0 0 0 0  Down, Depressed, Hopeless 0 0 0 0 0  PHQ - 2 Score 0 0 0 0 0    Functional Status Survey: Is the patient deaf or have difficulty hearing?: No Does the patient have difficulty seeing, even when wearing glasses/contacts?: No Does the patient have difficulty concentrating, remembering, or making decisions?: No Does the patient have difficulty walking or climbing stairs?: No Does the patient have difficulty dressing or bathing?: No Does the patient have difficulty doing errands alone such as visiting a doctor's office or shopping?: No  MMSE - Mini  Mental State Exam 09/07/2018  Orientation to time 5  Orientation to Place 5  Registration 3  Attention/ Calculation 5  Recall 3  Language- name 2 objects 2  Language- repeat 1  Language- follow 3 step command 3  Language- read & follow direction 1  Write a sentence 1  Copy design 1  Total score 30    Fall Risk  09/07/2018 09/07/2018 02/19/2018 12/12/2017 09/22/2017  Falls in the past year? 0 0 No No No      LABS: Recent Results (from the past 2160 hour(s))  Pap IG and HPV (high risk) DNA detection     Status: None   Collection Time: 09/07/18  2:12 PM  Result Value Ref Range   Interpretation NILM,QC     Comment: NEGATIVE FOR INTRAEPITHELIAL LESION OR MALIGNANCY. THIS SPECIMEN WAS RESCREENED AS PART OF OUR QUALITY CONTROL PROGRAM.    Category NIL     Comment: Negative for Intraepithelial Lesion   Adequacy ENDO     Comment: Satisfactory for evaluation. Endocervical and/or squamous metaplastic cells (endocervical component) are present.    Clinician Provided ICD10 Comment     Comment: Z12.4   Performed by: Comment     Comment: Rene Kocher, Supervisory Cytotechnologist (ASCP)   QC reviewed by: Comment     Comment: Cathlyn Parsons, Supervisory Cytotechnologist (ASCP)   Note: Comment     Comment: The Pap smear is a screening test designed to aid in the detection of premalignant and malignant conditions of the uterine cervix.  It is not a diagnostic procedure and should not be used as the sole means of detecting cervical cancer.  Both false-positive and false-negative reports do occur.    Test Methodology Comment     Comment: This liquid based ThinPrep(R) pap test was screened with the use of an image guided system.    HPV, high-risk Negative Negative    Comment: This nucleic acid amplification high-risk HPV test detects thirteen high-risk types (16,18,31,33,35,39,45,51,52,56,58,59,68) without differentiation.   UA/M w/rflx Culture, Routine     Status: Abnormal   Collection  Time: 09/07/18  2:13 PM  Result Value Ref Range   Specific Gravity, UA 1.021 1.005 - 1.030   pH, UA 5.0 5.0 - 7.5   Color, UA Yellow Yellow  Appearance Ur Clear Clear   Leukocytes, UA Negative Negative   Protein, UA Negative Negative/Trace   Glucose, UA Negative Negative   Ketones, UA Negative Negative   RBC, UA 1+ (A) Negative   Bilirubin, UA Negative Negative   Urobilinogen, Ur 0.2 0.2 - 1.0 mg/dL   Nitrite, UA Negative Negative   Microscopic Examination See below:     Comment: Microscopic was indicated and was performed.   Urinalysis Reflex Comment     Comment: This specimen will not reflex to a Urine Culture.  Microscopic Examination     Status: Abnormal   Collection Time: 09/07/18  2:13 PM  Result Value Ref Range   WBC, UA 0-5 0 - 5 /hpf   RBC, UA 0-2 0 - 2 /hpf   Epithelial Cells (non renal) 0-10 0 - 10 /hpf   Casts Present (A) None seen /lpf   Cast Type Hyaline casts N/A   Mucus, UA Present Not Estab.   Bacteria, UA None seen None seen/Few    Assessment/Plan: 1. Encounter for general adult medical examination with abnormal findings Annual health maintenance exam today  2. Essential hypertension Stable. Continue bp medication as prescribed  - lisinopril-hydrochlorothiazide (PRINZIDE,ZESTORETIC) 10-12.5 MG tablet; Take 1 tab po daily  Dispense: 30 tablet; Refill: 5  3. Chronic low back pain with bilateral sciatica, unspecified back pain laterality May continue to use tramadol 50mg , two tablets twice dailym as needed for pain. New prescription provided today.  - traMADol (ULTRAM) 50 MG tablet; Take 2 tablets (100 mg total) by mouth 2 (two) times daily.  Dispense: 120 tablet; Refill: 3  4. Overweight May continue phentermine 37.5mg  daily. Limit calorie intake to 1200mg  daily and incorporate exercise into her daily routine.  - phentermine (ADIPEX-P) 37.5 MG tablet; Take 1 tablet (37.5 mg total) by mouth daily before breakfast.  Dispense: 30 tablet; Refill: 1  5.  Routine cervical smear - Pap IG and HPV (high risk) DNA detection  6. Screening for breast cancer - MM DIGITAL SCREENING BILATERAL; Future  7. Dysuria - UA/M w/rflx Culture, Routine  General Counseling: katy brickell understanding of the findings of todays visit and agrees with plan of treatment. I have discussed any further diagnostic evaluation that may be needed or ordered today. We also reviewed her medications today. she has been encouraged to call the office with any questions or concerns that should arise related to todays visit.    Counseling:  Hypertension Counseling:   The following hypertensive lifestyle modification were recommended and discussed:  1. Limiting alcohol intake to less than 1 oz/day of ethanol:(24 oz of beer or 8 oz of wine or 2 oz of 100-proof whiskey). 2. Take baby ASA 81 mg daily. 3. Importance of regular aerobic exercise and losing weight. 4. Reduce dietary saturated fat and cholesterol intake for overall cardiovascular health. 5. Maintaining adequate dietary potassium, calcium, and magnesium intake. 6. Regular monitoring of the blood pressure. 7. Reduce sodium intake to less than 100 mmol/day (less than 2.3 gm of sodium or less than 6 gm of sodium choride)   This patient was seen by [place] with Dr Lavera Guise as a part of collaborative care agreement  Orders Placed This Encounter  Procedures  . Microscopic Examination  . MM DIGITAL SCREENING BILATERAL  . UA/M w/rflx Culture, Routine    Meds ordered this encounter  Medications  . lisinopril-hydrochlorothiazide (PRINZIDE,ZESTORETIC) 10-12.5 MG tablet    Sig: Take 1 tab po daily    Dispense:  30 tablet    Refill:  5    Order Specific Question:   Supervising Provider    Answer:   Lavera Guise [[id number]]  . traMADol (ULTRAM) 50 MG tablet    Sig: Take 2 tablets (100 mg total) by mouth 2 (two) times daily.    Dispense:  120 tablet    Refill:  3    Order Specific  Question:   Supervising Provider    Answer:   Lavera Guise [[id number]]  . phentermine (ADIPEX-P) 37.5 MG tablet    Sig: Take 1 tablet (37.5 mg total) by mouth daily before breakfast.    Dispense:  30 tablet    Refill:  1    Order Specific Question:   Supervising Provider    Answer:   Lavera Guise [[id number]]    Time spent: [place], MD  Internal Medicine

## 2018-09-08 LAB — UA/M W/RFLX CULTURE, ROUTINE
BILIRUBIN UA: NEGATIVE
GLUCOSE, UA: NEGATIVE
KETONES UA: NEGATIVE
LEUKOCYTES UA: NEGATIVE
Nitrite, UA: NEGATIVE
Protein, UA: NEGATIVE
Specific Gravity, UA: 1.021 (ref 1.005–1.030)
UUROB: 0.2 mg/dL (ref 0.2–1.0)
pH, UA: 5 (ref 5.0–7.5)

## 2018-09-08 LAB — MICROSCOPIC EXAMINATION: BACTERIA UA: NONE SEEN

## 2018-09-10 ENCOUNTER — Telehealth: Payer: Self-pay | Admitting: Nurse Practitioner

## 2018-09-10 NOTE — Telephone Encounter (Signed)
Prior authorization for tramadol has been approved , pharmacy notified good through 08/22/2019

## 2018-09-12 LAB — PAP IG AND HPV HIGH-RISK: HPV, HIGH-RISK: NEGATIVE

## 2018-09-16 DIAGNOSIS — R300 Dysuria: Secondary | ICD-10-CM | POA: Insufficient documentation

## 2018-09-16 DIAGNOSIS — E663 Overweight: Secondary | ICD-10-CM | POA: Insufficient documentation

## 2018-09-27 ENCOUNTER — Inpatient Hospital Stay: Payer: Medicare Other | Attending: Oncology

## 2018-09-27 DIAGNOSIS — D693 Immune thrombocytopenic purpura: Secondary | ICD-10-CM | POA: Insufficient documentation

## 2018-10-03 ENCOUNTER — Inpatient Hospital Stay: Payer: Medicare Other

## 2018-10-03 DIAGNOSIS — D693 Immune thrombocytopenic purpura: Secondary | ICD-10-CM | POA: Diagnosis not present

## 2018-10-03 LAB — CBC WITH DIFFERENTIAL/PLATELET
Abs Immature Granulocytes: 0.02 10*3/uL (ref 0.00–0.07)
BASOS PCT: 0 %
Basophils Absolute: 0 10*3/uL (ref 0.0–0.1)
Eosinophils Absolute: 0.1 10*3/uL (ref 0.0–0.5)
Eosinophils Relative: 2 %
HCT: 39.3 % (ref 36.0–46.0)
Hemoglobin: 13.1 g/dL (ref 12.0–15.0)
Immature Granulocytes: 0 %
LYMPHS ABS: 2.2 10*3/uL (ref 0.7–4.0)
LYMPHS PCT: 38 %
MCH: 31.9 pg (ref 26.0–34.0)
MCHC: 33.3 g/dL (ref 30.0–36.0)
MCV: 95.6 fL (ref 80.0–100.0)
Monocytes Absolute: 0.5 10*3/uL (ref 0.1–1.0)
Monocytes Relative: 8 %
NEUTROS PCT: 52 %
NRBC: 0 % (ref 0.0–0.2)
Neutro Abs: 3 10*3/uL (ref 1.7–7.7)
PLATELETS: 127 10*3/uL — AB (ref 150–400)
RBC: 4.11 MIL/uL (ref 3.87–5.11)
RDW: 12 % (ref 11.5–15.5)
WBC: 5.8 10*3/uL (ref 4.0–10.5)

## 2018-10-18 ENCOUNTER — Ambulatory Visit
Admission: RE | Admit: 2018-10-18 | Discharge: 2018-10-18 | Disposition: A | Discharge status: Home / Self Care | Payer: Medicare Other | Source: Ambulatory Visit | Attending: Nurse Practitioner | Admitting: Nurse Practitioner

## 2018-10-18 ENCOUNTER — Encounter: Payer: Self-pay | Admitting: *Deleted

## 2018-10-18 DIAGNOSIS — Z1231 Encounter for screening mammogram for malignant neoplasm of breast: Secondary | ICD-10-CM | POA: Diagnosis not present

## 2018-10-18 DIAGNOSIS — Z1239 Encounter for other screening for malignant neoplasm of breast: Secondary | ICD-10-CM

## 2018-10-19 ENCOUNTER — Encounter: Payer: Self-pay | Admitting: *Deleted

## 2018-10-19 ENCOUNTER — Ambulatory Visit
Admission: RE | Admit: 2018-10-19 | Discharge: 2018-10-19 | Disposition: A | Discharge status: Home / Self Care | Payer: Medicare Other | Attending: Gastroenterology | Admitting: Gastroenterology

## 2018-10-19 ENCOUNTER — Ambulatory Visit: Payer: Self-pay | Admitting: Nurse Practitioner

## 2018-10-19 ENCOUNTER — Ambulatory Visit: Payer: Medicare Other | Admitting: Anesthesiology

## 2018-10-19 ENCOUNTER — Encounter
Admission: RE | Disposition: A | Discharge status: Home / Self Care | Payer: Self-pay | Source: Home / Self Care | Attending: Gastroenterology

## 2018-10-19 DIAGNOSIS — F17200 Nicotine dependence, unspecified, uncomplicated: Secondary | ICD-10-CM | POA: Insufficient documentation

## 2018-10-19 DIAGNOSIS — I10 Essential (primary) hypertension: Secondary | ICD-10-CM | POA: Diagnosis not present

## 2018-10-19 DIAGNOSIS — K210 Gastro-esophageal reflux disease with esophagitis: Secondary | ICD-10-CM | POA: Diagnosis not present

## 2018-10-19 DIAGNOSIS — K228 Other specified diseases of esophagus: Secondary | ICD-10-CM | POA: Insufficient documentation

## 2018-10-19 DIAGNOSIS — K3189 Other diseases of stomach and duodenum: Secondary | ICD-10-CM | POA: Insufficient documentation

## 2018-10-19 DIAGNOSIS — Z886 Allergy status to analgesic agent status: Secondary | ICD-10-CM | POA: Diagnosis not present

## 2018-10-19 DIAGNOSIS — Z79899 Other long term (current) drug therapy: Secondary | ICD-10-CM | POA: Diagnosis not present

## 2018-10-19 DIAGNOSIS — K219 Gastro-esophageal reflux disease without esophagitis: Secondary | ICD-10-CM | POA: Insufficient documentation

## 2018-10-19 DIAGNOSIS — K2900 Acute gastritis without bleeding: Secondary | ICD-10-CM | POA: Diagnosis not present

## 2018-10-19 DIAGNOSIS — K766 Portal hypertension: Secondary | ICD-10-CM | POA: Insufficient documentation

## 2018-10-19 DIAGNOSIS — I878 Other specified disorders of veins: Secondary | ICD-10-CM | POA: Insufficient documentation

## 2018-10-19 DIAGNOSIS — K449 Diaphragmatic hernia without obstruction or gangrene: Secondary | ICD-10-CM | POA: Diagnosis not present

## 2018-10-19 DIAGNOSIS — K7460 Unspecified cirrhosis of liver: Secondary | ICD-10-CM | POA: Insufficient documentation

## 2018-10-19 DIAGNOSIS — K2950 Unspecified chronic gastritis without bleeding: Secondary | ICD-10-CM | POA: Insufficient documentation

## 2018-10-19 DIAGNOSIS — K2970 Gastritis, unspecified, without bleeding: Secondary | ICD-10-CM | POA: Diagnosis not present

## 2018-10-19 HISTORY — DX: Gastro-esophageal reflux disease with esophagitis: K21.0

## 2018-10-19 HISTORY — DX: Gastritis, unspecified, without bleeding: K29.70

## 2018-10-19 HISTORY — PX: ESOPHAGOGASTRODUODENOSCOPY (EGD) WITH PROPOFOL: SHX5813

## 2018-10-19 HISTORY — DX: Personal history of colon polyps, unspecified: Z86.0100

## 2018-10-19 HISTORY — DX: Personal history of colonic polyps: Z86.010

## 2018-10-19 HISTORY — DX: Gastro-esophageal reflux disease with esophagitis, without bleeding: K21.00

## 2018-10-19 LAB — CBC WITH DIFFERENTIAL/PLATELET
Abs Immature Granulocytes: 0.03 10*3/uL (ref 0.00–0.07)
BASOS ABS: 0 10*3/uL (ref 0.0–0.1)
Basophils Relative: 1 %
Eosinophils Absolute: 0.1 10*3/uL (ref 0.0–0.5)
Eosinophils Relative: 1 %
HCT: 43.3 % (ref 36.0–46.0)
Hemoglobin: 14.7 g/dL (ref 12.0–15.0)
Immature Granulocytes: 1 %
LYMPHS ABS: 1.9 10*3/uL (ref 0.7–4.0)
Lymphocytes Relative: 31 %
MCH: 32.2 pg (ref 26.0–34.0)
MCHC: 33.9 g/dL (ref 30.0–36.0)
MCV: 95 fL (ref 80.0–100.0)
Monocytes Absolute: 0.5 10*3/uL (ref 0.1–1.0)
Monocytes Relative: 9 %
Neutro Abs: 3.5 10*3/uL (ref 1.7–7.7)
Neutrophils Relative %: 57 %
PLATELETS: 159 10*3/uL (ref 150–400)
RBC: 4.56 MIL/uL (ref 3.87–5.11)
RDW: 12.3 % (ref 11.5–15.5)
WBC: 6 10*3/uL (ref 4.0–10.5)
nRBC: 0 % (ref 0.0–0.2)

## 2018-10-19 LAB — PROTIME-INR
INR: 0.9 (ref 0.8–1.2)
PROTHROMBIN TIME: 12 s (ref 11.4–15.2)

## 2018-10-19 SURGERY — ESOPHAGOGASTRODUODENOSCOPY (EGD) WITH PROPOFOL
Anesthesia: General

## 2018-10-19 MED ORDER — FENTANYL CITRATE (PF) 100 MCG/2ML IJ SOLN
INTRAMUSCULAR | Status: AC
  Filled 2018-10-19: qty 2

## 2018-10-19 MED ORDER — SODIUM CHLORIDE 0.9 % IV SOLN
INTRAVENOUS | Status: DC
  Administered 2018-10-19: 1000 mL via INTRAVENOUS

## 2018-10-19 MED ORDER — LIDOCAINE HCL (CARDIAC) PF 100 MG/5ML IV SOSY
PREFILLED_SYRINGE | INTRAVENOUS | Status: DC | PRN
  Administered 2018-10-19: 50 mg via INTRAVENOUS

## 2018-10-19 MED ORDER — PROPOFOL 500 MG/50ML IV EMUL
INTRAVENOUS | Status: DC | PRN
  Administered 2018-10-19: 185 ug/kg/min via INTRAVENOUS

## 2018-10-19 MED ORDER — SODIUM CHLORIDE 0.9 % IV SOLN
INTRAVENOUS | Status: DC
  Administered 2018-10-19: 13:00:00 via INTRAVENOUS

## 2018-10-19 MED ORDER — PROPOFOL 10 MG/ML IV BOLUS
INTRAVENOUS | Status: DC | PRN
  Administered 2018-10-19 (×3): 50 mg via INTRAVENOUS

## 2018-10-19 MED ORDER — PROPOFOL 10 MG/ML IV BOLUS
INTRAVENOUS | Status: AC
  Filled 2018-10-19: qty 40

## 2018-10-19 MED ORDER — PROPOFOL 10 MG/ML IV BOLUS
INTRAVENOUS | Status: AC
  Filled 2018-10-19: qty 20

## 2018-10-19 MED ORDER — FENTANYL CITRATE (PF) 100 MCG/2ML IJ SOLN
INTRAMUSCULAR | Status: DC | PRN
  Administered 2018-10-19 (×2): 50 ug via INTRAVENOUS

## 2018-10-19 NOTE — Anesthesia Preprocedure Evaluation (Signed)
Anesthesia Evaluation  Patient identified by MRN, date of birth, ID band Patient awake    Reviewed: Allergy & Precautions, NPO status , Patient's Chart, lab work & pertinent test results  History of Anesthesia Complications Negative for: history of anesthetic complications  Airway Mallampati: III       Dental  (+) Dental Advidsory Given, Teeth Intact   Pulmonary neg shortness of breath, neg COPD, neg recent URI, Current Smoker,           Cardiovascular hypertension, (-) angina(-) CAD, (-) Past MI, (-) Cardiac Stents and (-) CABG (-) dysrhythmias (-) Valvular Problems/Murmurs     Neuro/Psych    GI/Hepatic GERD  Medicated,(+) Hepatitis - (s/p treatment), C  Endo/Other  negative endocrine ROSDiabetes: stones.  Renal/GU Renal disease     Musculoskeletal   Abdominal   Peds  Hematology   Anesthesia Other Findings Past Medical History: No date: Arthritis No date: Esophagitis, reflux No date: Gastritis 2014: Hepatitis C     Comment:  treated with Harvoni No date: History of colon polyps No date: Hypertension No date: Kidney stone No date: Melanoma ([place]) No date: Osteoarthritis 2014: Sepsis due to urinary tract infection ([place]) No date: Thrombocytopenia, primary ([place])     Comment:  Dr Grayland Ormond   Reproductive/Obstetrics                             Anesthesia Physical  Anesthesia Plan  ASA: II  Anesthesia Plan: General   Post-op Pain Management:    Induction: Intravenous  PONV Risk Score and Plan: 2 and Propofol infusion and TIVA  Airway Management Planned: Natural Airway and Nasal Cannula  Additional Equipment:   Intra-op Plan:   Post-operative Plan:   Informed Consent: I have reviewed the patients History and Physical, chart, labs and discussed the procedure including the risks, benefits and alternatives for the proposed anesthesia with the patient or authorized representative  who has indicated his/her understanding and acceptance.       Plan Discussed with:   Anesthesia Plan Comments:         Anesthesia Quick Evaluation

## 2018-10-19 NOTE — Op Note (Signed)
[facility] Patient Name: Angie [name] Procedure Date: 10/19/2018 12:25 PM MRN: [id number] Account #: [id number] Date of Birth: 1955-11-15 Admit Type: Outpatient Age: 63 Room: [facility] ENDO ROOM 3 Gender: Female Note Status: Finalized Procedure:            Upper GI endoscopy Indications:          Cirrhosis rule out esophageal varices Providers:            Lollie Sails, MD Referring MD:         Lavera Guise, MD (Referring MD) Medicines:            Monitored Anesthesia Care Complications:        No immediate complications. Procedure:            Pre-Anesthesia Assessment:                       - ASA Grade Assessment: III - A patient with severe                        systemic disease.                       After obtaining informed consent, the endoscope was                        passed under direct vision. Throughout the procedure,                        the patient's blood pressure, pulse, and oxygen                        saturations were monitored continuously. The Endoscope                        was introduced through the mouth, and advanced to the                        third part of duodenum. The upper GI endoscopy was                        accomplished without difficulty. The patient tolerated                        the procedure well. Findings:      no evidence of esophageal varices      The Z-line was irregular. Biopsies were taken with a cold forceps for       histology.      The exam of the esophagus was otherwise normal.      Mild portal hypertensive gastropathy was found in the gastric body.      Patchy mild inflammation characterized by congestion (edema) and       erythema was found in the gastric antrum. Biopsies were taken with a       cold forceps for histology.      Biopsies were taken with a cold forceps in the gastric body for       histology.      The cardia and gastric fundus were normal on retroflexion, no evidence        of gastric varices.      The examined duodenum was normal.  A small hiatal hernia was found. The Z-line was a variable distance from       incisors; the hiatal hernia was sliding. Impression:           - Z-line irregular. Biopsied.                       - Portal hypertensive gastropathy.                       - Gastritis. Biopsied.                       - Normal examined duodenum.                       - Biopsies were taken with a cold forceps for histology                        in the gastric body. Recommendation:       - Discharge patient to home.                       - Continue present medications. Procedure Code(s):    --- Professional ---                       [phone], Esophagogastroduodenoscopy, flexible, transoral;                        with biopsy, single or multiple Diagnosis Code(s):    --- Professional ---                       K22.8, Other specified diseases of esophagus                       K76.6, Portal hypertension                       K31.89, Other diseases of stomach and duodenum                       K29.70, Gastritis, unspecified, without bleeding                       K74.60, Unspecified cirrhosis of liver CPT copyright 2018 American Medical Association. All rights reserved. The codes documented in this report are preliminary and upon coder review may  be revised to meet current compliance requirements. Lollie Sails, MD 10/19/2018 1:31:20 PM This report has been signed electronically. Number of Addenda: 0 Note Initiated On: 10/19/2018 12:25 PM      [facility]

## 2018-10-19 NOTE — Anesthesia Post-op Follow-up Note (Signed)
Anesthesia QCDR form completed.        

## 2018-10-19 NOTE — Transfer of Care (Signed)
Immediate Anesthesia Transfer of Care Note  Patient: NAN MAYA  Procedure(s) Performed: ESOPHAGOGASTRODUODENOSCOPY (EGD) WITH PROPOFOL (N/A )  Patient Location: Endoscopy Unit  Anesthesia Type:General  Level of Consciousness: awake, alert  and oriented  Airway & Oxygen Therapy: Patient Spontanous Breathing and Patient connected to nasal cannula oxygen  Post-op Assessment: Report given to RN and Post -op Vital signs reviewed and stable  Post vital signs: Reviewed and stable  Last Vitals:  Vitals Value Taken Time  BP    Temp    Pulse    Resp    SpO2      Last Pain:  Vitals:   10/19/18 1208  TempSrc: Tympanic         Complications: No apparent anesthesia complications

## 2018-10-19 NOTE — Anesthesia Postprocedure Evaluation (Signed)
Anesthesia Post Note  Patient: Angie [name]  Procedure(s) Performed: ESOPHAGOGASTRODUODENOSCOPY (EGD) WITH PROPOFOL (N/A )  Patient location during evaluation: Endoscopy Anesthesia Type: General Level of consciousness: awake and alert Pain management: pain level controlled Vital Signs Assessment: post-procedure vital signs reviewed and stable Respiratory status: spontaneous breathing, nonlabored ventilation, respiratory function stable and patient connected to nasal cannula oxygen Cardiovascular status: blood pressure returned to baseline and stable Postop Assessment: no apparent nausea or vomiting Anesthetic complications: no     Last Vitals:  Vitals:   10/19/18 1339 10/19/18 1349  BP: 119/78 134/78  Pulse: 81 77  Resp: (!) 21 17  Temp:    SpO2: 98% 99%    Last Pain:  Vitals:   10/19/18 1349  TempSrc:   PainSc: 0-No pain                 Martha Clan

## 2018-10-19 NOTE — H&P (Signed)
Outpatient short stay form Pre-procedure 10/19/2018 12:45 PM Angie Sails MD  Primary Physician: Angie [name]  Reason for visit: EGD  History of present illness: Patient is a 63 year old female presenting today for an EGD in regards to her personal history of cirrhosis of the liver.  Her last EGD was 05/13/2016 for screening purposes.  At that time she showed a mild portal gastropathy but no evidence of varices.  Most recent platelet count is 127 and INR 1.0.  She takes no aspirin or blood thinning agent.  Her albumin has been in the low four-point range, normal.    Current Facility-Administered Medications:  .  0.9 %  sodium chloride infusion, , Intravenous, Continuous, Angie Sails, MD .  0.9 %  sodium chloride infusion, , Intravenous, Continuous, Angie Sails, MD, Last Rate: 20 mL/hr at 10/19/18 1229, 1,000 mL at 10/19/18 1229  Medications Prior to Admission  Medication Sig Dispense Refill Last Dose  . lisinopril-hydrochlorothiazide (PRINZIDE,ZESTORETIC) 10-12.5 MG tablet Take 1 tab po daily 30 tablet 5 10/19/2018 at Unknown time  . Biotin 1 MG CAPS Take 1,000 mcg by mouth daily.   Taking  . cyclobenzaprine (FLEXERIL) 10 MG tablet Take 10 mg by mouth as needed for muscle spasms.   Taking  . hydrocortisone (ANUSOL-HC) 25 MG suppository Place 1 suppository (25 mg total) rectally 2 (two) times daily. 12 suppository 0 Taking  . hydrocortisone (PROCTOSOL HC) 2.5 % rectal cream Place 1 application rectally 2 (two) times daily. 30 g 0 Taking  . pantoprazole (PROTONIX) 40 MG tablet    Taking  . traMADol (ULTRAM) 50 MG tablet Take 2 tablets (100 mg total) by mouth 2 (two) times daily. 120 tablet 3      Allergies  Allergen Reactions  . Aspirin Nausea And Vomiting  . Nsaids Other (See Comments)    Liver function/[place]     Past Medical History:  Diagnosis Date  . Arthritis   . Esophagitis, reflux   . Gastritis   . Hepatitis C 2014   treated with Harvoni  . History  of colon polyps   . Hypertension   . Kidney stone   . Melanoma ([place])   . Osteoarthritis   . Sepsis due to urinary tract infection ([place]) 2014  . Thrombocytopenia, primary ([place])    Dr Angie [name]    Review of systems:      Physical Exam    Heart and lungs: Regular rate and rhythm without rub or gallop, lungs are bilaterally clear.    HEENT: Normocephalic atraumatic eyes are anicteric    Other:    Pertinant exam for procedure: Soft nontender nondistended bowel sounds positive normoactive.    Planned proceedures: EGD and indicated procedures. I have discussed the risks benefits and complications of procedures to include not limited to bleeding, infection, perforation and the risk of sedation and the patient wishes to proceed.    Angie Sails, MD Gastroenterology 10/19/2018  12:45 PM

## 2018-10-20 NOTE — Progress Notes (Signed)
Non-identifying Voicemail.  No message left. 

## 2018-10-22 ENCOUNTER — Ambulatory Visit (INDEPENDENT_AMBULATORY_CARE_PROVIDER_SITE_OTHER): Payer: Medicare Other | Admitting: Nurse Practitioner

## 2018-10-22 ENCOUNTER — Other Ambulatory Visit: Payer: Self-pay

## 2018-10-22 ENCOUNTER — Encounter: Payer: Self-pay | Admitting: Nurse Practitioner

## 2018-10-22 VITALS — BP 140/73 | HR 87 | Resp 16 | Ht 65.5 in | Wt 169.4 lb

## 2018-10-22 DIAGNOSIS — K219 Gastro-esophageal reflux disease without esophagitis: Secondary | ICD-10-CM | POA: Diagnosis not present

## 2018-10-22 DIAGNOSIS — I10 Essential (primary) hypertension: Secondary | ICD-10-CM

## 2018-10-22 DIAGNOSIS — E663 Overweight: Secondary | ICD-10-CM

## 2018-10-22 MED ORDER — PHENTERMINE HCL 37.5 MG PO TABS: 37.5000 mg | ORAL_TABLET | Freq: Every day | ORAL | 1 refills | Status: DC

## 2018-10-22 MED ORDER — PANTOPRAZOLE SODIUM 40 MG PO TBEC: 40.0000 mg | DELAYED_RELEASE_TABLET | Freq: Every day | ORAL | 5 refills | Status: DC

## 2018-10-22 NOTE — Progress Notes (Signed)
[facility] [place], [place] [address]  Internal MEDICINE  Office Visit Note  Patient Name: Angie [name]  [id number]  [id number]  Date of Service: 10/22/2018  Chief Complaint  Patient presents with  . Medical Management of Chronic Issues    6wk follow up weight managment    The patient is here for follow up of weight management. She is currently taking phentermine 37.5mg  daily. Has limited calorie intake to 1200 calories per day and is exercising daily. Has lost two pounds overall. Has no negative side effects from this medication. Blood pressure is well controlled.  Had endoscopy done on Friday. Takes pantoprazole 40mg  every day due to chronic gastritis. Has history of h.pylori infection.       Current Medication: Outpatient Encounter Medications as of 10/22/2018  Medication Sig  . Biotin 1 MG CAPS Take 1,000 mcg by mouth daily.  . cyclobenzaprine (FLEXERIL) 10 MG tablet Take 10 mg by mouth as needed for muscle spasms.  . hydrocortisone (ANUSOL-HC) 25 MG suppository Place 1 suppository (25 mg total) rectally 2 (two) times daily.  . hydrocortisone (PROCTOSOL HC) 2.5 % rectal cream Place 1 application rectally 2 (two) times daily.  Marland Kitchen lisinopril-hydrochlorothiazide (PRINZIDE,ZESTORETIC) 10-12.5 MG tablet Take 1 tab po daily  . pantoprazole (PROTONIX) 40 MG tablet Take 1 tablet (40 mg total) by mouth daily.  . phentermine (ADIPEX-P) 37.5 MG tablet Take 1 tablet (37.5 mg total) by mouth daily before breakfast.  . traMADol (ULTRAM) 50 MG tablet Take 2 tablets (100 mg total) by mouth 2 (two) times daily.  . [DISCONTINUED] pantoprazole (PROTONIX) 40 MG tablet    No facility-administered encounter medications on file as of 10/22/2018.     Surgical History: Past Surgical History:  Procedure Laterality Date  . COLONOSCOPY WITH PROPOFOL N/A 10/04/2016   Procedure: COLONOSCOPY WITH PROPOFOL;  Surgeon: Lollie Sails, MD;  Location: [facility] ENDOSCOPY;  Service:  Endoscopy;  Laterality: N/A;  . ESOPHAGOGASTRODUODENOSCOPY (EGD) WITH PROPOFOL N/A 05/13/2016   Procedure: ESOPHAGOGASTRODUODENOSCOPY (EGD) WITH PROPOFOL;  Surgeon: Lollie Sails, MD;  Location: [facility] ENDOSCOPY;  Service: Endoscopy;  Laterality: N/A;  . ESOPHAGOGASTRODUODENOSCOPY (EGD) WITH PROPOFOL N/A 10/19/2018   Procedure: ESOPHAGOGASTRODUODENOSCOPY (EGD) WITH PROPOFOL;  Surgeon: Lollie Sails, MD;  Location: [facility] ENDOSCOPY;  Service: Endoscopy;  Laterality: N/A;  . JOINT REPLACEMENT Left 2015  . TONSILLECTOMY    . TONSILLECTOMY    . TOTAL KNEE ARTHROPLASTY      Medical History: Past Medical History:  Diagnosis Date  . Arthritis   . Esophagitis, reflux   . Gastritis   . Hepatitis C 2014   treated with Harvoni  . History of colon polyps   . Hypertension   . Kidney stone   . Melanoma ([place])   . Osteoarthritis   . Sepsis due to urinary tract infection ([place]) 2014  . Thrombocytopenia, primary (HCC)    Dr Grayland Ormond    Family History: Family History  Problem Relation Age of Onset  . Liver cancer Father   . Cancer Father   . COPD Father   . Heart disease Father   . COPD Mother   . Hypertension Mother   . Diabetes Sister   . Diabetes Paternal Grandmother   . Heart disease Paternal Grandmother   . Hypertension Paternal Grandmother   . Diabetes Paternal Grandfather   . Heart disease Paternal Grandfather   . Hypertension Paternal Grandfather   . Breast cancer Neg Hx     Social History  Socioeconomic History  . Marital status: Single    Spouse name: Not on file  . Number of children: Not on file  . Years of education: Not on file  . Highest education level: Not on file  Occupational History  . Not on file  Social Needs  . Financial resource strain: Not on file  . Food insecurity:    Worry: Not on file    Inability: Not on file  . Transportation needs:    Medical: Not on file    Non-medical: Not on file  Tobacco Use  . Smoking status: Current Some Day  Smoker    Types: Cigarettes  . Smokeless tobacco: Never Used  Substance and Sexual Activity  . Alcohol use: Yes    Alcohol/week: 1.0 standard drinks    Types: 1 Glasses of wine per week    Comment: occasionally last week  . Drug use: No  . Sexual activity: Yes    Birth control/protection: None, Post-menopausal  Lifestyle  . Physical activity:    Days per week: Not on file    Minutes per session: Not on file  . Stress: Not on file  Relationships  . Social connections:    Talks on phone: Not on file    Gets together: Not on file    Attends religious service: Not on file    Active member of club or organization: Not on file    Attends meetings of clubs or organizations: Not on file    Relationship status: Not on file  . Intimate partner violence:    Fear of current or ex partner: Not on file    Emotionally abused: Not on file    Physically abused: Not on file    Forced sexual activity: Not on file  Other Topics Concern  . Not on file  Social History Narrative  . Not on file      Review of Systems  Constitutional: Negative for chills, fatigue and unexpected weight change.  HENT: Negative for congestion, postnasal drip, rhinorrhea, sneezing and sore throat.   Respiratory: Negative for cough, chest tightness, shortness of breath and wheezing.   Cardiovascular: Negative for chest pain and palpitations.  Gastrointestinal: Negative for abdominal pain, constipation, diarrhea, nausea and vomiting.  Endocrine: Negative for cold intolerance, heat intolerance, polydipsia and polyuria.  Musculoskeletal: Positive for arthralgias. Negative for back pain, joint swelling and neck pain.  Skin: Negative for rash.  Allergic/Immunologic: Negative for environmental allergies.  Neurological: Negative for dizziness, tremors, numbness and headaches.  Hematological: Negative for adenopathy. Does not bruise/bleed easily.  Psychiatric/Behavioral: Negative for behavioral problems (Depression),  sleep disturbance and suicidal ideas. The patient is not nervous/anxious.    Today's Vitals   10/22/18 1424  BP: 140/73  Pulse: 87  Resp: 16  SpO2: 94%  Weight: 169 lb 6.4 oz (76.8 kg)  Height: 5' 5.5" (1.664 m)   Body mass index is 27.76 kg/m.  Physical Exam Vitals signs and nursing note reviewed.  Constitutional:      General: She is not in acute distress.    Appearance: Normal appearance. She is well-developed. She is not diaphoretic.  HENT:     Head: Normocephalic and atraumatic.     Mouth/Throat:     Pharynx: No oropharyngeal exudate.  Eyes:     Pupils: Pupils are equal, round, and reactive to light.  Neck:     Musculoskeletal: Normal range of motion and neck supple.     Thyroid: No thyromegaly.     Vascular:  No carotid bruit or JVD.     Trachea: No tracheal deviation.  Cardiovascular:     Rate and Rhythm: Normal rate and regular rhythm.     Heart sounds: Normal heart sounds. No murmur. No friction rub. No gallop.   Pulmonary:     Effort: Pulmonary effort is normal. No respiratory distress.     Breath sounds: Normal breath sounds. No wheezing or rales.  Chest:     Chest wall: No tenderness.  Abdominal:     General: Bowel sounds are normal.     Palpations: Abdomen is soft.  Musculoskeletal: Normal range of motion.  Lymphadenopathy:     Cervical: No cervical adenopathy.  Skin:    General: Skin is warm and dry.  Neurological:     Mental Status: She is alert and oriented to person, place, and time.     Cranial Nerves: No cranial nerve deficit.  Psychiatric:        Behavior: Behavior normal.        Thought Content: Thought content normal.        Judgment: Judgment normal.    Assessment/Plan: 1. Gastroesophageal reflux disease without esophagitis Endoscopy done 10/19/2018. Continue pantoprazole 40mg  daily. Refills provided today. Awaiting pathology report.  - pantoprazole (PROTONIX) 40 MG tablet; Take 1 tablet (40 mg total) by mouth daily.  Dispense: 30 tablet;  Refill: 5  2. Essential hypertension Stable. Continue BP medication as prescribed.   3. Overweight Improved. Continue pehntermine 37.5mg  tablets daily. Limit calorie intake to 1200 calories per day. Include exercise into daily routine.  - phentermine (ADIPEX-P) 37.5 MG tablet; Take 1 tablet (37.5 mg total) by mouth daily before breakfast.  Dispense: 30 tablet; Refill: 1  General Counseling: Treniece verbalizes understanding of the findings of todays visit and agrees with plan of treatment. I have discussed any further diagnostic evaluation that may be needed or ordered today. We also reviewed her medications today. she has been encouraged to call the office with any questions or concerns that should arise related to todays visit.   There is a liability release in patients' chart. There has been a 10 minute discussion about the side effects including but not limited to elevated blood pressure, anxiety, lack of sleep and dry mouth. Pt understands and will like to start/continue on appetite suppressant at this time. There will be one month RX given at the time of visit with proper follow up. Nova diet plan with restricted calories is given to the pt. Pt understands and agrees with  plan of treatment  This patient was seen by Leretha Pol FNP Collaboration with Dr Lavera Guise as a part of collaborative care agreement  Meds ordered this encounter  Medications  . pantoprazole (PROTONIX) 40 MG tablet    Sig: Take 1 tablet (40 mg total) by mouth daily.    Dispense:  30 tablet    Refill:  5    Order Specific Question:   Supervising Provider    Answer:   Lavera Guise [[id number]]  . phentermine (ADIPEX-P) 37.5 MG tablet    Sig: Take 1 tablet (37.5 mg total) by mouth daily before breakfast.    Dispense:  30 tablet    Refill:  1    Order Specific Question:   Supervising Provider    Answer:   Lavera Guise [[id number]]    Time spent: 73 Minutes      Dr Lavera Guise Internal medicine

## 2018-10-23 DIAGNOSIS — H2513 Age-related nuclear cataract, bilateral: Secondary | ICD-10-CM | POA: Diagnosis not present

## 2018-10-23 LAB — SURGICAL PATHOLOGY

## 2018-10-25 NOTE — Progress Notes (Signed)
Please review on next visit, might need PPI

## 2018-12-03 ENCOUNTER — Ambulatory Visit (INDEPENDENT_AMBULATORY_CARE_PROVIDER_SITE_OTHER): Payer: Medicare Other | Admitting: Internal Medicine

## 2018-12-03 ENCOUNTER — Other Ambulatory Visit: Payer: Self-pay

## 2018-12-03 ENCOUNTER — Encounter: Payer: Self-pay | Admitting: Nurse Practitioner

## 2018-12-03 DIAGNOSIS — I10 Essential (primary) hypertension: Secondary | ICD-10-CM

## 2018-12-03 DIAGNOSIS — E663 Overweight: Secondary | ICD-10-CM

## 2018-12-03 MED ORDER — PHENTERMINE HCL 37.5 MG PO TABS: 37.5000 mg | ORAL_TABLET | Freq: Every day | ORAL | 0 refills | Status: DC

## 2018-12-03 NOTE — Progress Notes (Signed)
[facility] [place], [place] [address]  Internal MEDICINE  Telephone Visit  Patient Name: Angie [name]  [id number]  [id number]  Date of Service: 12/10/2018  I connected with the patient at 1010 by telephone and verified the patients identity using two identifiers.   I discussed the limitations, risks, security and privacy concerns of performing an evaluation and management service by telephone and the availability of in person appointments. I also discussed with the patient that there may be a patient responsible charge related to the service.  The patient expressed understanding and agrees to proceed.    Chief Complaint  Patient presents with  . Telephone Assessment  . Telephone Screen  . Medical Management of Chronic Issues    weight Management    HPI Pt is connected via webcam due to risk of covid-19. Feels well. Blood pressure is under good control. Pt is taking a weight management pharmacologic aide. She is able to lose 3-4 lbs each month, denies any insomnia, elevated BP   Current Medication: Outpatient Encounter Medications as of 12/03/2018  Medication Sig  . Biotin 1 MG CAPS Take 1,000 mcg by mouth daily.  . cyclobenzaprine (FLEXERIL) 10 MG tablet Take 10 mg by mouth as needed for muscle spasms.  . hydrocortisone (ANUSOL-HC) 25 MG suppository Place 1 suppository (25 mg total) rectally 2 (two) times daily.  . hydrocortisone (PROCTOSOL HC) 2.5 % rectal cream Place 1 application rectally 2 (two) times daily.  Marland Kitchen lisinopril-hydrochlorothiazide (PRINZIDE,ZESTORETIC) 10-12.5 MG tablet Take 1 tab po daily  . pantoprazole (PROTONIX) 40 MG tablet Take 1 tablet (40 mg total) by mouth daily.  . phentermine (ADIPEX-P) 37.5 MG tablet Take 1 tablet (37.5 mg total) by mouth daily before breakfast.  . traMADol (ULTRAM) 50 MG tablet Take 2 tablets (100 mg total) by mouth 2 (two) times daily.  . [DISCONTINUED] phentermine (ADIPEX-P) 37.5 MG tablet Take 1 tablet  (37.5 mg total) by mouth daily before breakfast.   No facility-administered encounter medications on file as of 12/03/2018.     Surgical History: Past Surgical History:  Procedure Laterality Date  . COLONOSCOPY WITH PROPOFOL N/A 10/04/2016   Procedure: COLONOSCOPY WITH PROPOFOL;  Surgeon: Lollie Sails, MD;  Location: [facility] ENDOSCOPY;  Service: Endoscopy;  Laterality: N/A;  . ESOPHAGOGASTRODUODENOSCOPY (EGD) WITH PROPOFOL N/A 05/13/2016   Procedure: ESOPHAGOGASTRODUODENOSCOPY (EGD) WITH PROPOFOL;  Surgeon: Lollie Sails, MD;  Location[facility] ENDOSCOPY;  Service: Endoscopy;  Laterality: N/A;  . ESOPHAGOGASTRODUODENOSCOPY (EGD) WITH PROPOFOL N/A 10/19/2018   Procedure: ESOPHAGOGASTRODUODENOSCOPY (EGD) WITH PROPOFOL;  Surgeon: Lollie Sails, MD;  Location: [facility] ENDOSCOPY;  Service: Endoscopy;  Laterality: N/A;  . JOINT REPLACEMENT Left 2015  . TONSILLECTOMY    . TONSILLECTOMY    . TOTAL KNEE ARTHROPLASTY      Medical History: Past Medical History:  Diagnosis Date  . Arthritis   . Esophagitis, reflux   . Gastritis   . Hepatitis C 2014   treated with Harvoni  . History of colon polyps   . Hypertension   . Kidney stone   . Melanoma ([employer])   . Osteoarthritis   . Sepsis due to urinary tract infection ([place]) 2014  . Thrombocytopenia, primary (HCC)    Dr Grayland Ormond    Family History: Family History  Problem Relation Age of Onset  . Liver cancer Father   . Cancer Father   . COPD Father   . Heart disease Father   . COPD Mother   . Hypertension Mother   .  Diabetes Sister   . Diabetes Paternal Grandmother   . Heart disease Paternal Grandmother   . Hypertension Paternal Grandmother   . Diabetes Paternal Grandfather   . Heart disease Paternal Grandfather   . Hypertension Paternal Grandfather   . Breast cancer Neg Hx     Social History   Socioeconomic History  . Marital status: Single    Spouse name: Not on file  . Number of children: Not on file  . Years of  education: Not on file  . Highest education level: Not on file  Occupational History  . Not on file  Social Needs  . Financial resource strain: Not on file  . Food insecurity:    Worry: Not on file    Inability: Not on file  . Transportation needs:    Medical: Not on file    Non-medical: Not on file  Tobacco Use  . Smoking status: Current Some Day Smoker    Types: Cigarettes  . Smokeless tobacco: Never Used  Substance and Sexual Activity  . Alcohol use: Yes    Alcohol/week: 1.0 standard drinks    Types: 1 Glasses of wine per week    Comment: occasionally last week  . Drug use: No  . Sexual activity: Yes    Birth control/protection: None, Post-menopausal  Lifestyle  . Physical activity:    Days per week: Not on file    Minutes per session: Not on file  . Stress: Not on file  Relationships  . Social connections:    Talks on phone: Not on file    Gets together: Not on file    Attends religious service: Not on file    Active member of club or organization: Not on file    Attends meetings of clubs or organizations: Not on file    Relationship status: Not on file  . Intimate partner violence:    Fear of current or ex partner: Not on file    Emotionally abused: Not on file    Physically abused: Not on file    Forced sexual activity: Not on file  Other Topics Concern  . Not on file  Social History Narrative  . Not on file   Review of Systems  Constitutional: Negative for chills.  HENT: Negative.   Respiratory: Negative for chest tightness.   Cardiovascular: Negative for chest pain and leg swelling.  Neurological: Negative for dizziness.  Psychiatric/Behavioral: Positive for dysphoric mood. Negative for sleep disturbance.  All other systems reviewed and are negative.  Vital Signs: Temp 97.6 F (36.4 C)   Resp 16   Ht 5' 5.5" (1.664 m)   Wt 167 lb (75.8 kg)   BMI 27.37 kg/m   Observation/Objective: Pt is pleasant to talk to, no acute distress.    Assessment/Plan: 1. Essential hypertension - Continue Lisinopril/hctz as before  2. Overweight - phentermine (ADIPEX-P) 37.5 MG tablet; Take 1 tablet (37.5 mg total) by mouth daily before breakfast.  Dispense: 30 tablet; Refill: 0  General Counseling: kindle strohmeier understanding of the findings of today's phone visit and agrees with plan of treatment. I have discussed any further diagnostic evaluation that may be needed or ordered today. We also reviewed her medications today. she has been encouraged to call the office with any questions or concerns that should arise related to todays visit.  Obesity Counseling: Risk Assessment: An assessment of behavioral risk factors was made today and includes lack of exercise sedentary lifestyle, lack of portion control and poor dietary habits.  Risk Modification Advice: She was counseled on portion control guidelines. Restricting daily caloric intake to. . The detrimental long term effects of obesity on her health and ongoing poor compliance was also discussed with the patient.   Meds ordered this encounter  Medications  . phentermine (ADIPEX-P) 37.5 MG tablet    Sig: Take 1 tablet (37.5 mg total) by mouth daily before breakfast.    Dispense:  30 tablet    Refill:  0    Time spent:14 Minutes    Dr Lavera Guise Internal medicine

## 2018-12-31 ENCOUNTER — Ambulatory Visit (INDEPENDENT_AMBULATORY_CARE_PROVIDER_SITE_OTHER): Payer: Medicare Other | Admitting: Nurse Practitioner

## 2018-12-31 ENCOUNTER — Encounter: Payer: Self-pay | Admitting: Nurse Practitioner

## 2018-12-31 ENCOUNTER — Other Ambulatory Visit: Payer: Self-pay

## 2018-12-31 VITALS — BP 134/80 | HR 94 | Temp 97.6°F | Ht 65.5 in | Wt 164.1 lb

## 2018-12-31 DIAGNOSIS — E663 Overweight: Secondary | ICD-10-CM

## 2018-12-31 DIAGNOSIS — K219 Gastro-esophageal reflux disease without esophagitis: Secondary | ICD-10-CM

## 2018-12-31 DIAGNOSIS — I10 Essential (primary) hypertension: Secondary | ICD-10-CM

## 2018-12-31 MED ORDER — PHENTERMINE HCL 37.5 MG PO TABS: 37.5000 mg | ORAL_TABLET | Freq: Every day | ORAL | 1 refills | Status: DC

## 2018-12-31 NOTE — Progress Notes (Signed)
[facility] [place], [place] [address]  Internal MEDICINE  Telephone Visit  Patient Name: VIENNE CORCORAN  [id number]  [id number]  Date of Service: 12/31/2018  I connected with the patient at 9:49am by webcam and verified the patients identity using two identifiers.   I discussed the limitations, risks, security and privacy concerns of performing an evaluation and management service by webcam and the availability of in person appointments. I also discussed with the patient that there may be a patient responsible charge related to the service.  The patient expressed understanding and agrees to proceed.    Chief Complaint  Patient presents with  . Telephone Assessment  . Telephone Screen  . Medical Management of Chronic Issues    weight management     The patient has been contacted via webcam for follow up visit due to concerns for spread of novel coronavirus. The patient is here for follow up of weight management. She is currently taking phentermine 37.5mg  daily. Has limited calorie intake to 1200 calories per day and is exercising daily. Has lost three pounds overall. Has no negative side effects from this medication. Blood pressure is well controlled.       Current Medication: Outpatient Encounter Medications as of 12/31/2018  Medication Sig  . Biotin 1 MG CAPS Take 1,000 mcg by mouth daily.  . cyclobenzaprine (FLEXERIL) 10 MG tablet Take 10 mg by mouth as needed for muscle spasms.  . hydrocortisone (ANUSOL-HC) 25 MG suppository Place 1 suppository (25 mg total) rectally 2 (two) times daily.  . hydrocortisone (PROCTOSOL HC) 2.5 % rectal cream Place 1 application rectally 2 (two) times daily.  Marland Kitchen lisinopril-hydrochlorothiazide (PRINZIDE,ZESTORETIC) 10-12.5 MG tablet Take 1 tab po daily  . pantoprazole (PROTONIX) 40 MG tablet Take 1 tablet (40 mg total) by mouth daily.  . phentermine (ADIPEX-P) 37.5 MG tablet Take 1 tablet (37.5 mg total) by mouth daily  before breakfast.  . traMADol (ULTRAM) 50 MG tablet Take 2 tablets (100 mg total) by mouth 2 (two) times daily.  . [DISCONTINUED] phentermine (ADIPEX-P) 37.5 MG tablet Take 1 tablet (37.5 mg total) by mouth daily before breakfast.   No facility-administered encounter medications on file as of 12/31/2018.     Surgical History: Past Surgical History:  Procedure Laterality Date  . COLONOSCOPY WITH PROPOFOL N/A 10/04/2016   Procedure: COLONOSCOPY WITH PROPOFOL;  Surgeon: Lollie Sails, MD;  Location: [facility] ENDOSCOPY;  Service: Endoscopy;  Laterality: N/A;  . ESOPHAGOGASTRODUODENOSCOPY (EGD) WITH PROPOFOL N/A 05/13/2016   Procedure: ESOPHAGOGASTRODUODENOSCOPY (EGD) WITH PROPOFOL;  Surgeon: Lollie Sails, MD;  Location: [facility] ENDOSCOPY;  Service: Endoscopy;  Laterality: N/A;  . ESOPHAGOGASTRODUODENOSCOPY (EGD) WITH PROPOFOL N/A 10/19/2018   Procedure: ESOPHAGOGASTRODUODENOSCOPY (EGD) WITH PROPOFOL;  Surgeon: Lollie Sails, MD;  Location: [facility] ENDOSCOPY;  Service: Endoscopy;  Laterality: N/A;  . JOINT REPLACEMENT Left 2015  . TONSILLECTOMY    . TONSILLECTOMY    . TOTAL KNEE ARTHROPLASTY      Medical History: Past Medical History:  Diagnosis Date  . Arthritis   . Esophagitis, reflux   . Gastritis   . Hepatitis C 2014   treated with Harvoni  . History of colon polyps   . Hypertension   . Kidney stone   . Melanoma ([place])   . Osteoarthritis   . Sepsis due to urinary tract infection ([place]) 2014  . Thrombocytopenia, primary (HCC)    Dr Grayland Ormond    Family History: Family History  Problem Relation Age of Onset  .  Liver cancer Father   . Cancer Father   . COPD Father   . Heart disease Father   . COPD Mother   . Hypertension Mother   . Diabetes Sister   . Diabetes Paternal Grandmother   . Heart disease Paternal Grandmother   . Hypertension Paternal Grandmother   . Diabetes Paternal Grandfather   . Heart disease Paternal Grandfather   . Hypertension Paternal Grandfather   .  Breast cancer Neg Hx     Social History   Socioeconomic History  . Marital status: Single    Spouse name: Not on file  . Number of children: Not on file  . Years of education: Not on file  . Highest education level: Not on file  Occupational History  . Not on file  Social Needs  . Financial resource strain: Not on file  . Food insecurity:    Worry: Not on file    Inability: Not on file  . Transportation needs:    Medical: Not on file    Non-medical: Not on file  Tobacco Use  . Smoking status: Current Some Day Smoker    Types: Cigarettes  . Smokeless tobacco: Never Used  Substance and Sexual Activity  . Alcohol use: Yes    Alcohol/week: 1.0 standard drinks    Types: 1 Glasses of wine per week    Comment: occasionally last week  . Drug use: No  . Sexual activity: Yes    Birth control/protection: None, Post-menopausal  Lifestyle  . Physical activity:    Days per week: Not on file    Minutes per session: Not on file  . Stress: Not on file  Relationships  . Social connections:    Talks on phone: Not on file    Gets together: Not on file    Attends religious service: Not on file    Active member of club or organization: Not on file    Attends meetings of clubs or organizations: Not on file    Relationship status: Not on file  . Intimate partner violence:    Fear of current or ex partner: Not on file    Emotionally abused: Not on file    Physically abused: Not on file    Forced sexual activity: Not on file  Other Topics Concern  . Not on file  Social History Narrative  . Not on file      Review of Systems  Today's Vitals   12/31/18 0913  BP: 134/80  Pulse: 94  Temp: 97.6 F (36.4 C)  Weight: 164 lb 1.6 oz (74.4 kg)  Height: 5' 5.5" (1.664 m)   Body mass index is 26.89 kg/m.  Observation/Objective:   The patient is alert and oriented. She is pleasant and answers all questions appropriately. Breathing is non-labored. She is in no acute distress at  this time.    Assessment/Plan: 1. Essential hypertension Stable. Continue bp medication as prescribed   2. Overweight Continues to do well. May take phentermine 37.5mg  tablets daily. Limit calorie intake to 1200 calories per day and exercies routinely.  - phentermine (ADIPEX-P) 37.5 MG tablet; Take 1 tablet (37.5 mg total) by mouth daily before breakfast.  Dispense: 30 tablet; Refill: 1  3. Gastroesophageal reflux disease without esophagitis Stable.   General Counseling: maryon kemnitz understanding of the findings of today's phone visit and agrees with plan of treatment. I have discussed any further diagnostic evaluation that may be needed or ordered today. We also reviewed her medications today. she  has been encouraged to call the office with any questions or concerns that should arise related to todays visit.   There is a liability release in patients' chart. There has been a 10 minute discussion about the side effects including but not limited to elevated blood pressure, anxiety, lack of sleep and dry mouth. Pt understands and will like to start/continue on appetite suppressant at this time. There will be one month RX given at the time of visit with proper follow up. Nova diet plan with restricted calories is given to the pt. Pt understands and agrees with  plan of treatment  This patient was seen by Leretha Pol FNP Collaboration with Dr Lavera Guise as a part of collaborative care agreement  Meds ordered this encounter  Medications  . phentermine (ADIPEX-P) 37.5 MG tablet    Sig: Take 1 tablet (37.5 mg total) by mouth daily before breakfast.    Dispense:  30 tablet    Refill:  1    Order Specific Question:   Supervising Provider    Answer:   Lavera Guise [[id number]]    Time spent: 49 Minutes    Dr Lavera Guise Internal medicine

## 2019-01-28 ENCOUNTER — Other Ambulatory Visit: Payer: Self-pay

## 2019-01-28 DIAGNOSIS — G8929 Other chronic pain: Secondary | ICD-10-CM

## 2019-01-28 MED ORDER — TRAMADOL HCL 50 MG PO TABS: 100.0000 mg | ORAL_TABLET | Freq: Two times a day (BID) | ORAL | 0 refills | Status: DC

## 2019-02-15 ENCOUNTER — Other Ambulatory Visit: Payer: Self-pay

## 2019-02-15 ENCOUNTER — Ambulatory Visit (INDEPENDENT_AMBULATORY_CARE_PROVIDER_SITE_OTHER): Payer: Medicare Other | Admitting: Nurse Practitioner

## 2019-02-15 ENCOUNTER — Encounter: Payer: Self-pay | Admitting: Nurse Practitioner

## 2019-02-15 VITALS — BP 138/72 | HR 82 | Resp 16 | Ht 65.5 in | Wt 159.0 lb

## 2019-02-15 DIAGNOSIS — G8929 Other chronic pain: Secondary | ICD-10-CM

## 2019-02-15 DIAGNOSIS — M5442 Lumbago with sciatica, left side: Secondary | ICD-10-CM

## 2019-02-15 DIAGNOSIS — E663 Overweight: Secondary | ICD-10-CM

## 2019-02-15 DIAGNOSIS — M5441 Lumbago with sciatica, right side: Secondary | ICD-10-CM | POA: Diagnosis not present

## 2019-02-15 DIAGNOSIS — I10 Essential (primary) hypertension: Secondary | ICD-10-CM

## 2019-02-15 MED ORDER — LISINOPRIL-HYDROCHLOROTHIAZIDE 10-12.5 MG PO TABS: ORAL_TABLET | ORAL | 5 refills | Status: DC

## 2019-02-15 MED ORDER — TRAMADOL HCL 50 MG PO TABS: 100.0000 mg | ORAL_TABLET | Freq: Two times a day (BID) | ORAL | 2 refills | Status: DC

## 2019-02-15 MED ORDER — PHENTERMINE HCL 37.5 MG PO TABS: 37.5000 mg | ORAL_TABLET | Freq: Every day | ORAL | 1 refills | Status: DC

## 2019-02-15 NOTE — Progress Notes (Signed)
[facility] [place], [place] [address]  Internal MEDICINE  Office Visit Note  Patient Name: Angie [name]  [id number]  [id number]  Date of Service: 02/15/2019  Chief Complaint  Patient presents with  . Medical Management of Chronic Issues    weight loss management     The patient is here for routine follow up of weight management. She has lost additional five pounds since she was last seen. Has been walking frequently with her sister. She is closely watching her calorie intake. She reports no negative side effects from taking this medication.       Current Medication: Outpatient Encounter Medications as of 02/15/2019  Medication Sig  . Biotin 1 MG CAPS Take 1,000 mcg by mouth daily.  . cyclobenzaprine (FLEXERIL) 10 MG tablet Take 10 mg by mouth as needed for muscle spasms.  . hydrocortisone (ANUSOL-HC) 25 MG suppository Place 1 suppository (25 mg total) rectally 2 (two) times daily.  . hydrocortisone (PROCTOSOL HC) 2.5 % rectal cream Place 1 application rectally 2 (two) times daily.  Marland Kitchen lisinopril-hydrochlorothiazide (ZESTORETIC) 10-12.5 MG tablet Take 1 tab po daily  . pantoprazole (PROTONIX) 40 MG tablet Take 1 tablet (40 mg total) by mouth daily.  . phentermine (ADIPEX-P) 37.5 MG tablet Take 1 tablet (37.5 mg total) by mouth daily before breakfast.  . traMADol (ULTRAM) 50 MG tablet Take 2 tablets (100 mg total) by mouth 2 (two) times daily.  . [DISCONTINUED] lisinopril-hydrochlorothiazide (PRINZIDE,ZESTORETIC) 10-12.5 MG tablet Take 1 tab po daily  . [DISCONTINUED] phentermine (ADIPEX-P) 37.5 MG tablet Take 1 tablet (37.5 mg total) by mouth daily before breakfast.  . [DISCONTINUED] traMADol (ULTRAM) 50 MG tablet Take 2 tablets (100 mg total) by mouth 2 (two) times daily.   No facility-administered encounter medications on file as of 02/15/2019.     Surgical History: Past Surgical History:  Procedure Laterality Date  . COLONOSCOPY WITH PROPOFOL  N/A 10/04/2016   Procedure: COLONOSCOPY WITH PROPOFOL;  Surgeon: Lollie Sails, MD;  Location[facility] ENDOSCOPY;  Service: Endoscopy;  Laterality: N/A;  . ESOPHAGOGASTRODUODENOSCOPY (EGD) WITH PROPOFOL N/A 05/13/2016   Procedure: ESOPHAGOGASTRODUODENOSCOPY (EGD) WITH PROPOFOL;  Surgeon: Lollie Sails, MD;  Location: [facility] ENDOSCOPY;  Service: Endoscopy;  Laterality: N/A;  . ESOPHAGOGASTRODUODENOSCOPY (EGD) WITH PROPOFOL N/A 10/19/2018   Procedure: ESOPHAGOGASTRODUODENOSCOPY (EGD) WITH PROPOFOL;  Surgeon: Lollie Sails, MD;  Location: [facility] ENDOSCOPY;  Service: Endoscopy;  Laterality: N/A;  . JOINT REPLACEMENT Left 2015  . TONSILLECTOMY    . TONSILLECTOMY    . TOTAL KNEE ARTHROPLASTY      Medical History: Past Medical History:  Diagnosis Date  . Arthritis   . Esophagitis, reflux   . Gastritis   . Hepatitis C 2014   treated with Harvoni  . History of colon polyps   . Hypertension   . Kidney stone   . Melanoma ([place])   . Osteoarthritis   . Sepsis due to urinary tract infection ([place]) 2014  . Thrombocytopenia, primary (HCC)    Dr Grayland Ormond    Family History: Family History  Problem Relation Age of Onset  . Liver cancer Father   . Cancer Father   . COPD Father   . Heart disease Father   . COPD Mother   . Hypertension Mother   . Diabetes Sister   . Diabetes Paternal Grandmother   . Heart disease Paternal Grandmother   . Hypertension Paternal Grandmother   . Diabetes Paternal Grandfather   . Heart disease Paternal Grandfather   .  Hypertension Paternal Grandfather   . Breast cancer Neg Hx     Social History   Socioeconomic History  . Marital status: Single    Spouse name: Not on file  . Number of children: Not on file  . Years of education: Not on file  . Highest education level: Not on file  Occupational History  . Not on file  Social Needs  . Financial resource strain: Not on file  . Food insecurity    Worry: Not on file    Inability: Not on file  .  Transportation needs    Medical: Not on file    Non-medical: Not on file  Tobacco Use  . Smoking status: Current Some Day Smoker    Types: Cigarettes  . Smokeless tobacco: Never Used  Substance and Sexual Activity  . Alcohol use: Yes    Alcohol/week: 1.0 standard drinks    Types: 1 Glasses of wine per week    Comment: occasionally last week  . Drug use: No  . Sexual activity: Yes    Birth control/protection: None, Post-menopausal  Lifestyle  . Physical activity    Days per week: Not on file    Minutes per session: Not on file  . Stress: Not on file  Relationships  . Social Herbalist on phone: Not on file    Gets together: Not on file    Attends religious service: Not on file    Active member of club or organization: Not on file    Attends meetings of clubs or organizations: Not on file    Relationship status: Not on file  . Intimate partner violence    Fear of current or ex partner: Not on file    Emotionally abused: Not on file    Physically abused: Not on file    Forced sexual activity: Not on file  Other Topics Concern  . Not on file  Social History Narrative  . Not on file      Review of Systems  Constitutional: Negative for activity change, chills, fatigue and unexpected weight change.       Weight loss of 5 pounds since her most recent visit.   HENT: Negative for congestion, postnasal drip, rhinorrhea, sneezing and sore throat.   Respiratory: Negative for cough, chest tightness, shortness of breath and wheezing.   Cardiovascular: Negative for chest pain and palpitations.  Gastrointestinal: Negative for abdominal pain, constipation, diarrhea, nausea and vomiting.  Endocrine: Negative for cold intolerance, heat intolerance, polydipsia and polyuria.  Musculoskeletal: Positive for arthralgias. Negative for back pain, joint swelling and neck pain.       Persistent low back and hip pain. Takes tramadol twice daily when needed which does help the pain.    Skin: Negative for rash.  Allergic/Immunologic: Negative for environmental allergies.  Neurological: Negative for dizziness, tremors, numbness and headaches.  Hematological: Negative for adenopathy. Does not bruise/bleed easily.  Psychiatric/Behavioral: Negative for behavioral problems (Depression), sleep disturbance and suicidal ideas. The patient is not nervous/anxious.     Today's Vitals   02/15/19 0852  BP: 138/72  Pulse: 82  Resp: 16  SpO2: 95%  Weight: 159 lb (72.1 kg)  Height: 5' 5.5" (1.664 m)   Body mass index is 26.06 kg/m.  Physical Exam Vitals signs and nursing note reviewed.  Constitutional:      General: She is not in acute distress.    Appearance: Normal appearance. She is well-developed. She is not diaphoretic.  HENT:  Head: Normocephalic and atraumatic.     Mouth/Throat:     Pharynx: No oropharyngeal exudate.  Eyes:     Pupils: Pupils are equal, round, and reactive to light.  Neck:     Musculoskeletal: Normal range of motion and neck supple.     Thyroid: No thyromegaly.     Vascular: No carotid bruit or JVD.     Trachea: No tracheal deviation.  Cardiovascular:     Rate and Rhythm: Normal rate and regular rhythm.     Heart sounds: Normal heart sounds. No murmur. No friction rub. No gallop.   Pulmonary:     Effort: Pulmonary effort is normal. No respiratory distress.     Breath sounds: Normal breath sounds. No wheezing or rales.  Chest:     Chest wall: No tenderness.  Abdominal:     General: Bowel sounds are normal.     Palpations: Abdomen is soft.  Musculoskeletal: Normal range of motion.  Lymphadenopathy:     Cervical: No cervical adenopathy.  Skin:    General: Skin is warm and dry.  Neurological:     Mental Status: She is alert and oriented to person, place, and time.     Cranial Nerves: No cranial nerve deficit.  Psychiatric:        Behavior: Behavior normal.        Thought Content: Thought content normal.        Judgment: Judgment  normal.   Assessment/Plan: 1. Essential hypertension Stable. Continue bp medication as prescribed - lisinopril-hydrochlorothiazide (ZESTORETIC) 10-12.5 MG tablet; Take 1 tab po daily  Dispense: 30 tablet; Refill: 5  2. Overweight Continues to improve. May take phentermine 37.5mg  daily. Limit calorie intake to 1200 calories per day and continue to incorporate exercise into daily routine.  - phentermine (ADIPEX-P) 37.5 MG tablet; Take 1 tablet (37.5 mg total) by mouth daily before breakfast.  Dispense: 30 tablet; Refill: 1  3. Chronic low back pain with bilateral sciatica, unspecified back pain laterality May continue to take tramadol 100mg  twice daily as needed for pain. A new prescription was sent to her pharmacy today. - traMADol (ULTRAM) 50 MG tablet; Take 2 tablets (100 mg total) by mouth 2 (two) times daily.  Dispense: 120 tablet; Refill: 2  General Counseling: Vinia verbalizes understanding of the findings of todays visit and agrees with plan of treatment. I have discussed any further diagnostic evaluation that may be needed or ordered today. We also reviewed her medications today. she has been encouraged to call the office with any questions or concerns that should arise related to todays visit.   There is a liability release in patients' chart. There has been a 10 minute discussion about the side effects including but not limited to elevated blood pressure, anxiety, lack of sleep and dry mouth. Pt understands and will like to start/continue on appetite suppressant at this time. There will be one month RX given at the time of visit with proper follow up. Nova diet plan with restricted calories is given to the pt. Pt understands and agrees with  plan of treatment  This patient was seen by Leretha Pol FNP Collaboration with Dr Lavera Guise as a part of collaborative care agreement  Meds ordered this encounter  Medications  . lisinopril-hydrochlorothiazide (ZESTORETIC) 10-12.5 MG  tablet    Sig: Take 1 tab po daily    Dispense:  30 tablet    Refill:  5    Order Specific Question:   Supervising Provider  Answer:   Lavera Guise [place]  . phentermine (ADIPEX-P) 37.5 MG tablet    Sig: Take 1 tablet (37.5 mg total) by mouth daily before breakfast.    Dispense:  30 tablet    Refill:  1    Order Specific Question:   Supervising Provider    Answer:   Lavera Guise [place]  . traMADol (ULTRAM) 50 MG tablet    Sig: Take 2 tablets (100 mg total) by mouth 2 (two) times daily.    Dispense:  120 tablet    Refill:  2    Order Specific Question:   Supervising Provider    Answer:   Lavera Guise [[id number]]    Time spent: 10 Minutes      Dr Lavera Guise Internal medicine

## 2019-03-24 NOTE — Progress Notes (Deleted)
Angie [name]  [phone]) [phone] Fax:(336) [phone]  ID: Criss Alvine OB: [birth year]  MR#: [id number]  [id number]#:[id number]  Patient Care Team: Lavera Guise, MD as PCP - General (Internal Medicine)  CHIEF COMPLAINT: Autoimmune thrombocytopenia.  INTERVAL HISTORY: Patient returns to clinic today for repeat laboratory work and routine yearly follow-up.  She continues to feel well and remains asymptomatic.  She has no neurologic complaints.  She denies any recent fevers or illnesses.  She has a good appetite and denies weight loss.  She has no chest pain or shortness of breath.  She denies any nausea, vomiting, constipation, or diarrhea.  She has no melena or hematochezia.  She has no urinary complaints.  Patient feels at her baseline offers no specific complaints today.  REVIEW OF SYSTEMS:   Review of Systems  Constitutional: Negative.  Negative for fever, malaise/fatigue and weight loss.  Respiratory: Negative.  Negative for cough, hemoptysis and shortness of breath.   Cardiovascular: Negative.  Negative for chest pain and leg swelling.  Gastrointestinal: Negative.  Negative for abdominal pain.  Genitourinary: Negative.  Negative for dysuria.  Musculoskeletal: Negative.  Negative for back pain.  Skin: Negative.  Negative for rash.  Neurological: Negative.  Negative for sensory change, focal weakness, weakness and headaches.  Psychiatric/Behavioral: Negative.  The patient is not nervous/anxious.     As per HPI. Otherwise, a complete review of systems is negative.  PAST MEDICAL HISTORY: Past Medical History:  Diagnosis Date  . Arthritis   . Esophagitis, reflux   . Gastritis   . Hepatitis C 2014   treated with Harvoni  . History of colon polyps   . Hypertension   . Kidney stone   . Melanoma ([place])   . Osteoarthritis   . Sepsis due to urinary tract infection ([place]) 2014  . Thrombocytopenia, primary ([place])    Dr Grayland Ormond    PAST SURGICAL HISTORY: Past  Surgical History:  Procedure Laterality Date  . COLONOSCOPY WITH PROPOFOL N/A 10/04/2016   Procedure: COLONOSCOPY WITH PROPOFOL;  Surgeon: Lollie Sails, MD;  Location: [facility] ENDOSCOPY;  Service: Endoscopy;  Laterality: N/A;  . ESOPHAGOGASTRODUODENOSCOPY (EGD) WITH PROPOFOL N/A 05/13/2016   Procedure: ESOPHAGOGASTRODUODENOSCOPY (EGD) WITH PROPOFOL;  Surgeon: Lollie Sails, MD;  Location: [facility] ENDOSCOPY;  Service: Endoscopy;  Laterality: N/A;  . ESOPHAGOGASTRODUODENOSCOPY (EGD) WITH PROPOFOL N/A 10/19/2018   Procedure: ESOPHAGOGASTRODUODENOSCOPY (EGD) WITH PROPOFOL;  Surgeon: Lollie Sails, MD;  Location: [facility] ENDOSCOPY;  Service: Endoscopy;  Laterality: N/A;  . JOINT REPLACEMENT Left 2015  . TONSILLECTOMY    . TONSILLECTOMY    . TOTAL KNEE ARTHROPLASTY      FAMILY HISTORY: Family History  Problem Relation Age of Onset  . Liver cancer Father   . Cancer Father   . COPD Father   . Heart disease Father   . COPD Mother   . Hypertension Mother   . Diabetes Sister   . Diabetes Paternal Grandmother   . Heart disease Paternal Grandmother   . Hypertension Paternal Grandmother   . Diabetes Paternal Grandfather   . Heart disease Paternal Grandfather   . Hypertension Paternal Grandfather   . Breast cancer Neg Hx     ADVANCED DIRECTIVES (Y/N):  N  HEALTH MAINTENANCE: Social History   Tobacco Use  . Smoking status: Current Some Day Smoker    Types: Cigarettes  . Smokeless tobacco: Never Used  Substance Use Topics  . Alcohol use: Yes    Alcohol/week: 1.0 standard drinks    Types:  1 Glasses of wine per week    Comment: occasionally last week  . Drug use: No     Colonoscopy:  PAP:  Bone density:  Lipid panel:  Allergies  Allergen Reactions  . Aspirin Nausea And Vomiting  . Nsaids Other (See Comments)    Liver function/[place]    Current Outpatient Medications  Medication Sig Dispense Refill  . Biotin 1 MG CAPS Take 1,000 mcg by mouth daily.    .  cyclobenzaprine (FLEXERIL) 10 MG tablet Take 10 mg by mouth as needed for muscle spasms.    . hydrocortisone (ANUSOL-HC) 25 MG suppository Place 1 suppository (25 mg total) rectally 2 (two) times daily. 12 suppository 0  . hydrocortisone (PROCTOSOL HC) 2.5 % rectal cream Place 1 application rectally 2 (two) times daily. 30 g 0  . lisinopril-hydrochlorothiazide (ZESTORETIC) 10-12.5 MG tablet Take 1 tab po daily 30 tablet 5  . pantoprazole (PROTONIX) 40 MG tablet Take 1 tablet (40 mg total) by mouth daily. 30 tablet 5  . phentermine (ADIPEX-P) 37.5 MG tablet Take 1 tablet (37.5 mg total) by mouth daily before breakfast. 30 tablet 1  . traMADol (ULTRAM) 50 MG tablet Take 2 tablets (100 mg total) by mouth 2 (two) times daily. 120 tablet 2   No current facility-administered medications for this visit.     OBJECTIVE: There were no vitals filed for this visit.   There is no height or weight on file to calculate BMI.    ECOG FS:0 - Asymptomatic  General: Well-developed, well-nourished, no acute distress. Eyes: Pink conjunctiva, anicteric sclera. HEENT: Normocephalic, moist mucous membranes. Lungs: Clear to auscultation bilaterally. Heart: Regular rate and rhythm. No rubs, murmurs, or gallops. Abdomen: Soft, nontender, nondistended. No organomegaly noted, normoactive bowel sounds. Musculoskeletal: No edema, cyanosis, or clubbing. Neuro: Alert, answering all questions appropriately. Cranial nerves grossly intact. Skin: No rashes or petechiae noted. Psych: Normal affect.  LAB RESULTS:  Lab Results  Component Value Date   NA 140 03/08/2018   K 4.3 03/08/2018   CL 106 03/08/2018   CO2 27 03/08/2018   GLUCOSE 108 (H) 03/08/2018   BUN 26 (H) 03/08/2018   CREATININE 0.73 03/08/2018   CALCIUM 9.5 03/08/2018   PROT 7.2 12/22/2017   ALBUMIN 4.4 12/22/2017   AST 30 12/22/2017   ALT 31 12/22/2017   ALKPHOS 73 12/22/2017   BILITOT 0.7 12/22/2017   GFRNONAA >60 03/08/2018   GFRAA >60 03/08/2018     Lab Results  Component Value Date   WBC 6.0 10/19/2018   NEUTROABS 3.5 10/19/2018   HGB 14.7 10/19/2018   HCT 43.3 10/19/2018   MCV 95.0 10/19/2018   PLT 159 10/19/2018     STUDIES: No results found.  ASSESSMENT: Autoimmune thrombocytopenia.  PLAN:    1. Autoimmune thrombocytopenia: Patient's platelet antibodies are positive.  She also had a positive ANA, which was evaluated by rheumatology and determined to be clinically insignificant.  Patient completed 4 cycles of weekly Rituxan on Jan 17, 2014. The remainder of her laboratory work was either negative or within normal limits. She does not have splenomegaly.  Patient's platelet count is within normal limits today at 151.  No further intervention is needed.  Return to clinic in 6 months with repeat laboratory work only and then in 1 year for further evaluation.  If her platelet count remains within normal limits at that time, can consider discharging from clinic and have primary care monitor for recurrence.   2. Hypertension: Patient blood pressure is mildly  elevated today.  Continue evaluation and treatment per primary care.  Patient expressed understanding and was in agreement with this plan. She also understands that She can call clinic at any time with any questions, concerns, or complaints.    Lloyd Huger, MD   03/24/2019 10:23 AM

## 2019-03-28 ENCOUNTER — Inpatient Hospital Stay: Payer: Medicare Other

## 2019-03-28 ENCOUNTER — Inpatient Hospital Stay: Payer: Medicare Other | Admitting: Oncology

## 2019-04-02 ENCOUNTER — Other Ambulatory Visit: Payer: Self-pay

## 2019-04-02 ENCOUNTER — Encounter: Payer: Self-pay | Admitting: Nurse Practitioner

## 2019-04-02 ENCOUNTER — Ambulatory Visit (INDEPENDENT_AMBULATORY_CARE_PROVIDER_SITE_OTHER): Payer: Medicare Other | Admitting: Nurse Practitioner

## 2019-04-02 VITALS — BP 143/81 | HR 78 | Resp 16 | Ht 65.0 in | Wt 158.6 lb

## 2019-04-02 DIAGNOSIS — I10 Essential (primary) hypertension: Secondary | ICD-10-CM | POA: Diagnosis not present

## 2019-04-02 DIAGNOSIS — E663 Overweight: Secondary | ICD-10-CM | POA: Diagnosis not present

## 2019-04-02 MED ORDER — PHENTERMINE HCL 37.5 MG PO TABS: 37.5000 mg | ORAL_TABLET | Freq: Every day | ORAL | 1 refills | Status: DC

## 2019-04-02 NOTE — Progress Notes (Signed)
[facility] [place], [place] [address]  Internal MEDICINE  Office Visit Note  Patient Name: Angie [name]  [id number]  [id number]  Date of Service: 04/02/2019  Chief Complaint  Patient presents with  . Follow-up    weight management  . Hypertension    The patient is here for follow up of weight management. She is taking phentermine 37.5mg  tablets. She has lost another pound since her last visit. She and her sister are walking outside as many nights as possible. Blood pressure continues to be well managed. She has no negative side effects or complaints regarding this medication.      Current Medication: Outpatient Encounter Medications as of 04/02/2019  Medication Sig  . Biotin 1 MG CAPS Take 1,000 mcg by mouth daily.  . cyclobenzaprine (FLEXERIL) 10 MG tablet Take 10 mg by mouth as needed for muscle spasms.  . hydrocortisone (ANUSOL-HC) 25 MG suppository Place 1 suppository (25 mg total) rectally 2 (two) times daily.  . hydrocortisone (PROCTOSOL HC) 2.5 % rectal cream Place 1 application rectally 2 (two) times daily.  Marland Kitchen lisinopril-hydrochlorothiazide (ZESTORETIC) 10-12.5 MG tablet Take 1 tab po daily  . pantoprazole (PROTONIX) 40 MG tablet Take 1 tablet (40 mg total) by mouth daily.  . phentermine (ADIPEX-P) 37.5 MG tablet Take 1 tablet (37.5 mg total) by mouth daily before breakfast.  . traMADol (ULTRAM) 50 MG tablet Take 2 tablets (100 mg total) by mouth 2 (two) times daily.  . [DISCONTINUED] phentermine (ADIPEX-P) 37.5 MG tablet Take 1 tablet (37.5 mg total) by mouth daily before breakfast.   No facility-administered encounter medications on file as of 04/02/2019.     Surgical History: Past Surgical History:  Procedure Laterality Date  . COLONOSCOPY WITH PROPOFOL N/A 10/04/2016   Procedure: COLONOSCOPY WITH PROPOFOL;  Surgeon: Lollie Sails, MD;  Location: [facility] ENDOSCOPY;  Service: Endoscopy;  Laterality: N/A;  . ESOPHAGOGASTRODUODENOSCOPY  (EGD) WITH PROPOFOL N/A 05/13/2016   Procedure: ESOPHAGOGASTRODUODENOSCOPY (EGD) WITH PROPOFOL;  Surgeon: Lollie Sails, MD;  Location: [facility] ENDOSCOPY;  Service: Endoscopy;  Laterality: N/A;  . ESOPHAGOGASTRODUODENOSCOPY (EGD) WITH PROPOFOL N/A 10/19/2018   Procedure: ESOPHAGOGASTRODUODENOSCOPY (EGD) WITH PROPOFOL;  Surgeon: Lollie Sails, MD;  Location: [facility] ENDOSCOPY;  Service: Endoscopy;  Laterality: N/A;  . JOINT REPLACEMENT Left 2015  . TONSILLECTOMY    . TONSILLECTOMY    . TOTAL KNEE ARTHROPLASTY      Medical History: Past Medical History:  Diagnosis Date  . Arthritis   . Esophagitis, reflux   . Gastritis   . Hepatitis C 2014   treated with Harvoni  . History of colon polyps   . Hypertension   . Kidney stone   . Melanoma ([place])   . Osteoarthritis   . Sepsis due to urinary tract infection ([place]) 2014  . Thrombocytopenia, primary ([facility])    Dr Grayland Ormond    Family History: Family History  Problem Relation Age of Onset  . Liver cancer Father   . Cancer Father   . COPD Father   . Heart disease Father   . COPD Mother   . Hypertension Mother   . Diabetes Sister   . Diabetes Paternal Grandmother   . Heart disease Paternal Grandmother   . Hypertension Paternal Grandmother   . Diabetes Paternal Grandfather   . Heart disease Paternal Grandfather   . Hypertension Paternal Grandfather   . Breast cancer Neg Hx     Social History   Socioeconomic History  . Marital status: Single  Spouse name: Not on file  . Number of children: Not on file  . Years of education: Not on file  . Highest education level: Not on file  Occupational History  . Not on file  Social Needs  . Financial resource strain: Not on file  . Food insecurity    Worry: Not on file    Inability: Not on file  . Transportation needs    Medical: Not on file    Non-medical: Not on file  Tobacco Use  . Smoking status: Current Some Day Smoker    Types: Cigarettes  . Smokeless tobacco: Never Used   Substance and Sexual Activity  . Alcohol use: Yes    Alcohol/week: 1.0 standard drinks    Types: 1 Glasses of wine per week    Comment: occasionally last week  . Drug use: No  . Sexual activity: Yes    Birth control/protection: None, Post-menopausal  Lifestyle  . Physical activity    Days per week: Not on file    Minutes per session: Not on file  . Stress: Not on file  Relationships  . Social Herbalist on phone: Not on file    Gets together: Not on file    Attends religious service: Not on file    Active member of club or organization: Not on file    Attends meetings of clubs or organizations: Not on file    Relationship status: Not on file  . Intimate partner violence    Fear of current or ex partner: Not on file    Emotionally abused: Not on file    Physically abused: Not on file    Forced sexual activity: Not on file  Other Topics Concern  . Not on file  Social History Narrative  . Not on file      Review of Systems  Constitutional: Negative for activity change, chills, fatigue and unexpected weight change.       Weight loss of one pounds since most recent visit.   HENT: Negative for congestion, postnasal drip, rhinorrhea, sneezing and sore throat.   Respiratory: Negative for cough, chest tightness, shortness of breath and wheezing.   Cardiovascular: Negative for chest pain and palpitations.  Gastrointestinal: Negative for abdominal pain, constipation, diarrhea, nausea and vomiting.  Endocrine: Negative for cold intolerance, heat intolerance, polydipsia and polyuria.  Musculoskeletal: Positive for arthralgias. Negative for back pain, joint swelling and neck pain.       Persistent low back and hip pain. Takes tramadol twice daily when needed which does help the pain.   Skin: Negative for rash.  Allergic/Immunologic: Negative for environmental allergies.  Neurological: Negative for dizziness, tremors, numbness and headaches.  Hematological: Negative for  adenopathy. Does not bruise/bleed easily.  Psychiatric/Behavioral: Negative for behavioral problems (Depression), sleep disturbance and suicidal ideas. The patient is not nervous/anxious.     Today's Vitals   04/02/19 0934  BP: (!) 143/81  Pulse: 78  Resp: 16  SpO2: 95%  Weight: 158 lb 9.6 oz (71.9 kg)  Height: 5\' 5"  (1.651 m)   Body mass index is 26.39 kg/m.  Physical Exam Vitals signs and nursing note reviewed.  Constitutional:      General: She is not in acute distress.    Appearance: Normal appearance. She is well-developed. She is not diaphoretic.  HENT:     Head: Normocephalic and atraumatic.     Mouth/Throat:     Pharynx: No oropharyngeal exudate.  Eyes:     Pupils:  Pupils are equal, round, and reactive to light.  Neck:     Musculoskeletal: Normal range of motion and neck supple.     Thyroid: No thyromegaly.     Vascular: No carotid bruit or JVD.     Trachea: No tracheal deviation.  Cardiovascular:     Rate and Rhythm: Normal rate and regular rhythm.     Heart sounds: Normal heart sounds. No murmur. No friction rub. No gallop.   Pulmonary:     Effort: Pulmonary effort is normal. No respiratory distress.     Breath sounds: Normal breath sounds. No wheezing or rales.  Chest:     Chest wall: No tenderness.  Abdominal:     General: Bowel sounds are normal.     Palpations: Abdomen is soft.  Musculoskeletal: Normal range of motion.  Lymphadenopathy:     Cervical: No cervical adenopathy.  Skin:    General: Skin is warm and dry.  Neurological:     Mental Status: She is alert and oriented to person, place, and time.     Cranial Nerves: No cranial nerve deficit.  Psychiatric:        Behavior: Behavior normal.        Thought Content: Thought content normal.        Judgment: Judgment normal.    Assessment/Plan:  1. Essential hypertension Stable. Continue bp medication as prescribed   2. Overweight Continues to do well. May take phentermine 37.5mg  daily.  Advised she limit calorie intake to 1200 calories per day and continue with regular exercise.  - phentermine (ADIPEX-P) 37.5 MG tablet; Take 1 tablet (37.5 mg total) by mouth daily before breakfast.  Dispense: 30 tablet; Refill: 1  General Counseling: Emmalin verbalizes understanding of the findings of todays visit and agrees with plan of treatment. I have discussed any further diagnostic evaluation that may be needed or ordered today. We also reviewed her medications today. she has been encouraged to call the office with any questions or concerns that should arise related to todays visit.    There is a liability release in patients' chart. There has been a 10 minute discussion about the side effects including but not limited to elevated blood pressure, anxiety, lack of sleep and dry mouth. Pt understands and will like to start/continue on appetite suppressant at this time. There will be one month RX given at the time of visit with proper follow up. [place] diet plan with restricted calories is given to the pt. Pt understands and agrees with  plan of treatment  This patient was seen by Leretha Pol FNP Collaboration with Dr Lavera Guise as a part of collaborative care agreement  Meds ordered this encounter  Medications  . phentermine (ADIPEX-P) 37.5 MG tablet    Sig: Take 1 tablet (37.5 mg total) by mouth daily before breakfast.    Dispense:  30 tablet    Refill:  1    Order Specific Question:   Supervising Provider    Answer:   Lavera Guise [[id number]]    Time spent: 72 Minutes      Dr Lavera Guise Internal medicine

## 2019-05-14 ENCOUNTER — Ambulatory Visit (INDEPENDENT_AMBULATORY_CARE_PROVIDER_SITE_OTHER): Payer: Medicare Other | Admitting: Nurse Practitioner

## 2019-05-14 ENCOUNTER — Other Ambulatory Visit: Payer: Self-pay

## 2019-05-14 ENCOUNTER — Encounter: Payer: Self-pay | Admitting: Nurse Practitioner

## 2019-05-14 VITALS — BP 137/73 | HR 88 | Resp 16 | Ht 65.5 in | Wt 158.6 lb

## 2019-05-14 DIAGNOSIS — G8929 Other chronic pain: Secondary | ICD-10-CM

## 2019-05-14 DIAGNOSIS — E663 Overweight: Secondary | ICD-10-CM

## 2019-05-14 DIAGNOSIS — M5442 Lumbago with sciatica, left side: Secondary | ICD-10-CM

## 2019-05-14 DIAGNOSIS — I10 Essential (primary) hypertension: Secondary | ICD-10-CM | POA: Diagnosis not present

## 2019-05-14 DIAGNOSIS — M5441 Lumbago with sciatica, right side: Secondary | ICD-10-CM

## 2019-05-14 MED ORDER — PHENTERMINE HCL 37.5 MG PO TABS: 37.5000 mg | ORAL_TABLET | Freq: Every day | ORAL | 1 refills | Status: DC

## 2019-05-14 MED ORDER — TRAMADOL HCL 50 MG PO TABS: 100.0000 mg | ORAL_TABLET | Freq: Two times a day (BID) | ORAL | 2 refills | Status: DC

## 2019-05-14 NOTE — Progress Notes (Signed)
[facility] [place], [place] [address]  Internal MEDICINE  Office Visit Note  Patient Name: Angie [name]  [id number]  [id number]  Date of Service: 05/14/2019  Chief Complaint  Patient presents with  . Hypertension  . Medical Management of Chronic Issues    weight management    The patient is here for follow up of weight management. She is taking phentermine as appetite suppressant. She has recently travelled to [place] to visit her family. She has also quit smoking since the last time I saw her. She has maintained her weight. She is exercising every day. She has no negative side effects from taking phentermine. She continues to use tramadol as needed for pain related to her hip and generalized arthritic pain. She does need to have a refill for this as well.       Current Medication: Outpatient Encounter Medications as of 05/14/2019  Medication Sig  . Biotin 1 MG CAPS Take 1,000 mcg by mouth daily.  . cyclobenzaprine (FLEXERIL) 10 MG tablet Take 10 mg by mouth as needed for muscle spasms.  . hydrocortisone (ANUSOL-HC) 25 MG suppository Place 1 suppository (25 mg total) rectally 2 (two) times daily.  . hydrocortisone (PROCTOSOL HC) 2.5 % rectal cream Place 1 application rectally 2 (two) times daily.  Marland Kitchen lisinopril-hydrochlorothiazide (ZESTORETIC) 10-12.5 MG tablet Take 1 tab po daily  . pantoprazole (PROTONIX) 40 MG tablet Take 1 tablet (40 mg total) by mouth daily.  . phentermine (ADIPEX-P) 37.5 MG tablet Take 1 tablet (37.5 mg total) by mouth daily before breakfast.  . traMADol (ULTRAM) 50 MG tablet Take 2 tablets (100 mg total) by mouth 2 (two) times daily.  . [DISCONTINUED] phentermine (ADIPEX-P) 37.5 MG tablet Take 1 tablet (37.5 mg total) by mouth daily before breakfast.  . [DISCONTINUED] traMADol (ULTRAM) 50 MG tablet Take 2 tablets (100 mg total) by mouth 2 (two) times daily.   No facility-administered encounter medications on file  as of 05/14/2019.     Surgical History: Past Surgical History:  Procedure Laterality Date  . COLONOSCOPY WITH PROPOFOL N/A 10/04/2016   Procedure: COLONOSCOPY WITH PROPOFOL;  Surgeon: Lollie Sails, MD;  Location: [facility] ENDOSCOPY;  Service: Endoscopy;  Laterality: N/A;  . ESOPHAGOGASTRODUODENOSCOPY (EGD) WITH PROPOFOL N/A 05/13/2016   Procedure: ESOPHAGOGASTRODUODENOSCOPY (EGD) WITH PROPOFOL;  Surgeon: Lollie Sails, MD;  Location: [facility] ENDOSCOPY;  Service: Endoscopy;  Laterality: N/A;  . ESOPHAGOGASTRODUODENOSCOPY (EGD) WITH PROPOFOL N/A 10/19/2018   Procedure: ESOPHAGOGASTRODUODENOSCOPY (EGD) WITH PROPOFOL;  Surgeon: Lollie Sails, MD;  Location: [facility] ENDOSCOPY;  Service: Endoscopy;  Laterality: N/A;  . JOINT REPLACEMENT Left 2015  . TONSILLECTOMY    . TONSILLECTOMY    . TOTAL KNEE ARTHROPLASTY      Medical History: Past Medical History:  Diagnosis Date  . Arthritis   . Esophagitis, reflux   . Gastritis   . Hepatitis C 2014   treated with Harvoni  . History of colon polyps   . Hypertension   . Kidney stone   . Melanoma ([place])   . Osteoarthritis   . Sepsis due to urinary tract infection ([place]) 2014  . Thrombocytopenia, primary (HCC)    Dr Grayland Ormond    Family History: Family History  Problem Relation Age of Onset  . Liver cancer Father   . Cancer Father   . COPD Father   . Heart disease Father   . COPD Mother   . Hypertension Mother   . Diabetes Sister   .  Diabetes Paternal Grandmother   . Heart disease Paternal Grandmother   . Hypertension Paternal Grandmother   . Diabetes Paternal Grandfather   . Heart disease Paternal Grandfather   . Hypertension Paternal Grandfather   . Breast cancer Neg Hx     Social History   Socioeconomic History  . Marital status: Single    Spouse name: Not on file  . Number of children: Not on file  . Years of education: Not on file  . Highest education level: Not on file  Occupational History  . Not on file  Social Needs   . Financial resource strain: Not on file  . Food insecurity    Worry: Not on file    Inability: Not on file  . Transportation needs    Medical: Not on file    Non-medical: Not on file  Tobacco Use  . Smoking status: Current Some Day Smoker    Types: Cigarettes  . Smokeless tobacco: Never Used  Substance and Sexual Activity  . Alcohol use: Yes    Alcohol/week: 1.0 standard drinks    Types: 1 Glasses of wine per week    Comment: occasionally last week  . Drug use: No  . Sexual activity: Yes    Birth control/protection: None, Post-menopausal  Lifestyle  . Physical activity    Days per week: Not on file    Minutes per session: Not on file  . Stress: Not on file  Relationships  . Social Herbalist on phone: Not on file    Gets together: Not on file    Attends religious service: Not on file    Active member of club or organization: Not on file    Attends meetings of clubs or organizations: Not on file    Relationship status: Not on file  . Intimate partner violence    Fear of current or ex partner: Not on file    Emotionally abused: Not on file    Physically abused: Not on file    Forced sexual activity: Not on file  Other Topics Concern  . Not on file  Social History Narrative  . Not on file      Review of Systems  Constitutional: Negative for activity change, chills, fatigue and unexpected weight change.       Stable weight since most recent visit.   HENT: Negative for congestion, postnasal drip, rhinorrhea, sneezing and sore throat.   Respiratory: Negative for cough, chest tightness, shortness of breath and wheezing.   Cardiovascular: Negative for chest pain and palpitations.  Gastrointestinal: Negative for abdominal pain, constipation, diarrhea, nausea and vomiting.  Endocrine: Negative for cold intolerance, heat intolerance, polydipsia and polyuria.  Musculoskeletal: Positive for arthralgias. Negative for back pain, joint swelling and neck pain.        Persistent low back and hip pain. Takes tramadol twice daily when needed which does help the pain.   Skin: Negative for rash.  Allergic/Immunologic: Negative for environmental allergies.  Neurological: Negative for dizziness, tremors, numbness and headaches.  Hematological: Negative for adenopathy. Does not bruise/bleed easily.  Psychiatric/Behavioral: Negative for behavioral problems (Depression), sleep disturbance and suicidal ideas. The patient is not nervous/anxious.    Today's Vitals   05/14/19 1145  BP: 137/73  Pulse: 88  Resp: 16  SpO2: 97%  Weight: 158 lb 9.6 oz (71.9 kg)  Height: 5' 5.5" (1.664 m)   Body mass index is 25.99 kg/m.  Physical Exam Vitals signs and nursing note reviewed.  Constitutional:  General: She is not in acute distress.    Appearance: Normal appearance. She is well-developed. She is not diaphoretic.  HENT:     Head: Normocephalic and atraumatic.     Mouth/Throat:     Pharynx: No oropharyngeal exudate.  Eyes:     Pupils: Pupils are equal, round, and reactive to light.  Neck:     Musculoskeletal: Normal range of motion and neck supple.     Thyroid: No thyromegaly.     Vascular: No carotid bruit or JVD.     Trachea: No tracheal deviation.  Cardiovascular:     Rate and Rhythm: Normal rate and regular rhythm.     Heart sounds: Normal heart sounds. No murmur. No friction rub. No gallop.   Pulmonary:     Effort: Pulmonary effort is normal. No respiratory distress.     Breath sounds: Normal breath sounds. No wheezing or rales.  Chest:     Chest wall: No tenderness.  Abdominal:     Palpations: Abdomen is soft.  Musculoskeletal: Normal range of motion.  Lymphadenopathy:     Cervical: No cervical adenopathy.  Skin:    General: Skin is warm and dry.  Neurological:     Mental Status: She is alert and oriented to person, place, and time.     Cranial Nerves: No cranial nerve deficit.  Psychiatric:        Behavior: Behavior normal.         Thought Content: Thought content normal.        Judgment: Judgment normal.   Assessment/Plan: 1. Essential hypertension Stable. Continue bp medication as prescribed   2. Chronic low back pain with bilateral sciatica, unspecified back pain laterality May continue to take tramadol 50mg  - one to two tablets twice daily as needed for pain. New prescription sent to her pharmacy.  - traMADol (ULTRAM) 50 MG tablet; Take 2 tablets (100 mg total) by mouth 2 (two) times daily.  Dispense: 120 tablet; Refill: 2  3. Overweight Stable. Continue phentermine 37.5mg  tablets daily. continue with low calorie diet and increased level of activity  - phentermine (ADIPEX-P) 37.5 MG tablet; Take 1 tablet (37.5 mg total) by mouth daily before breakfast.  Dispense: 30 tablet; Refill: 1  General Counseling: Angie [name] verbalizes understanding of the findings of todays visit and agrees with plan of treatment. I have discussed any further diagnostic evaluation that may be needed or ordered today. We also reviewed her medications today. she has been encouraged to call the office with any questions or concerns that should arise related to todays visit.    There is a liability release in patients' chart. There has been a 10 minute discussion about the side effects including but not limited to elevated blood pressure, anxiety, lack of sleep and dry mouth. Pt understands and will like to start/continue on appetite suppressant at this time. There will be one month RX given at the time of visit with proper follow up. Nova diet plan with restricted calories is given to the pt. Pt understands and agrees with  plan of treatment  This patient was seen by Leretha Pol FNP Collaboration with Dr Lavera Guise as a part of collaborative care agreement  Meds ordered this encounter  Medications  . traMADol (ULTRAM) 50 MG tablet    Sig: Take 2 tablets (100 mg total) by mouth 2 (two) times daily.    Dispense:  120 tablet    Refill:  2     Order Specific Question:  Supervising Provider    Answer:   Lavera Guise [id number]  . phentermine (ADIPEX-P) 37.5 MG tablet    Sig: Take 1 tablet (37.5 mg total) by mouth daily before breakfast.    Dispense:  30 tablet    Refill:  1    Order Specific Question:   Supervising Provider    Answer:   Lavera Guise [id number]    Time spent: 67 Minutes      Dr Lavera Guise Internal medicine

## 2019-06-26 ENCOUNTER — Other Ambulatory Visit: Payer: Self-pay

## 2019-06-26 ENCOUNTER — Ambulatory Visit (INDEPENDENT_AMBULATORY_CARE_PROVIDER_SITE_OTHER): Payer: Medicare Other | Admitting: Nurse Practitioner

## 2019-06-26 ENCOUNTER — Encounter: Payer: Self-pay | Admitting: Nurse Practitioner

## 2019-06-26 VITALS — BP 129/68 | HR 79 | Temp 97.6°F | Resp 16 | Ht 65.0 in | Wt 158.2 lb

## 2019-06-26 DIAGNOSIS — K219 Gastro-esophageal reflux disease without esophagitis: Secondary | ICD-10-CM | POA: Diagnosis not present

## 2019-06-26 DIAGNOSIS — M79641 Pain in right hand: Secondary | ICD-10-CM

## 2019-06-26 DIAGNOSIS — I10 Essential (primary) hypertension: Secondary | ICD-10-CM

## 2019-06-26 DIAGNOSIS — E663 Overweight: Secondary | ICD-10-CM

## 2019-06-26 MED ORDER — PHENTERMINE HCL 37.5 MG PO TABS: 37.5000 mg | ORAL_TABLET | Freq: Every day | ORAL | 1 refills | Status: DC

## 2019-06-26 NOTE — Progress Notes (Signed)
[facility] [place], [place] [address]  Internal MEDICINE  Office Visit Note  Patient Name: Angie [name]  [id number]  [id number]  Date of Service: 07/10/2019  Chief Complaint  Patient presents with  . Follow-up    weight management   . Hypertension  . Pain    pain around both thumbs on the palm, and upon a certain movement th right hand pain radiates up to arm     The patient is here for follow up of weight management. She is taking phentermine as appetite suppressant. She is still not smoking. It has been a bit over 2 months since her last cigarette.  She has maintained her weight, actually has weight loss of 6 ounces.  She has been unable to exercise recently. Her mother was recently diagnosed with lung cancer and patient and her sister have been taking their mom back and forth to doctor's appointments.   She has no negative side effects from taking phentermine. Blood pressure is doing very well since she quit smoking.  She is complaining of pain in the right hand at the base of her thumb. twicting and turning the hand over causes sharp pain which radiates up the right arm to the elbow. She has had no trauma or injury to the area.       Current Medication: Outpatient Encounter Medications as of 06/26/2019  Medication Sig  . Biotin 1 MG CAPS Take 1,000 mcg by mouth daily.  . cyclobenzaprine (FLEXERIL) 10 MG tablet Take 10 mg by mouth as needed for muscle spasms.  . hydrocortisone (ANUSOL-HC) 25 MG suppository Place 1 suppository (25 mg total) rectally 2 (two) times daily.  . hydrocortisone (PROCTOSOL HC) 2.5 % rectal cream Place 1 application rectally 2 (two) times daily.  Marland Kitchen lisinopril-hydrochlorothiazide (ZESTORETIC) 10-12.5 MG tablet Take 1 tab po daily  . pantoprazole (PROTONIX) 40 MG tablet Take 1 tablet (40 mg total) by mouth daily.  . phentermine (ADIPEX-P) 37.5 MG tablet Take 1 tablet (37.5 mg total) by mouth daily before breakfast.  . traMADol  (ULTRAM) 50 MG tablet Take 2 tablets (100 mg total) by mouth 2 (two) times daily.  . [DISCONTINUED] phentermine (ADIPEX-P) 37.5 MG tablet Take 1 tablet (37.5 mg total) by mouth daily before breakfast.   No facility-administered encounter medications on file as of 06/26/2019.     Surgical History: Past Surgical History:  Procedure Laterality Date  . COLONOSCOPY WITH PROPOFOL N/A 10/04/2016   Procedure: COLONOSCOPY WITH PROPOFOL;  Surgeon: Lollie Sails, MD;  Location: [facility] ENDOSCOPY;  Service: Endoscopy;  Laterality: N/A;  . ESOPHAGOGASTRODUODENOSCOPY (EGD) WITH PROPOFOL N/A 05/13/2016   Procedure: ESOPHAGOGASTRODUODENOSCOPY (EGD) WITH PROPOFOL;  Surgeon: Lollie Sails, MD;  Location: [facility] ENDOSCOPY;  Service: Endoscopy;  Laterality: N/A;  . ESOPHAGOGASTRODUODENOSCOPY (EGD) WITH PROPOFOL N/A 10/19/2018   Procedure: ESOPHAGOGASTRODUODENOSCOPY (EGD) WITH PROPOFOL;  Surgeon: Lollie Sails, MD;  Location: [facility] ENDOSCOPY;  Service: Endoscopy;  Laterality: N/A;  . JOINT REPLACEMENT Left 2015  . TONSILLECTOMY    . TONSILLECTOMY    . TOTAL KNEE ARTHROPLASTY      Medical History: Past Medical History:  Diagnosis Date  . Arthritis   . Esophagitis, reflux   . Gastritis   . Hepatitis C 2014   treated with Harvoni  . History of colon polyps   . Hypertension   . Kidney stone   . Melanoma ([place])   . Osteoarthritis   . Sepsis due to urinary tract infection ([place]) 2014  .  Thrombocytopenia, primary (HCC)    Dr Grayland Ormond    Family History: Family History  Problem Relation Age of Onset  . Liver cancer Father   . Cancer Father   . COPD Father   . Heart disease Father   . COPD Mother   . Hypertension Mother   . Diabetes Sister   . Diabetes Paternal Grandmother   . Heart disease Paternal Grandmother   . Hypertension Paternal Grandmother   . Diabetes Paternal Grandfather   . Heart disease Paternal Grandfather   . Hypertension Paternal Grandfather   . Breast cancer Neg Hx      Social History   Socioeconomic History  . Marital status: Single    Spouse name: Not on file  . Number of children: Not on file  . Years of education: Not on file  . Highest education level: Not on file  Occupational History  . Not on file  Social Needs  . Financial resource strain: Not on file  . Food insecurity    Worry: Not on file    Inability: Not on file  . Transportation needs    Medical: Not on file    Non-medical: Not on file  Tobacco Use  . Smoking status: Former Smoker    Types: Cigarettes  . Smokeless tobacco: Never Used  Substance and Sexual Activity  . Alcohol use: Yes    Alcohol/week: 1.0 standard drinks    Types: 1 Glasses of wine per week    Comment: occasionally last week  . Drug use: No  . Sexual activity: Yes    Birth control/protection: None, Post-menopausal  Lifestyle  . Physical activity    Days per week: Not on file    Minutes per session: Not on file  . Stress: Not on file  Relationships  . Social Herbalist on phone: Not on file    Gets together: Not on file    Attends religious service: Not on file    Active member of club or organization: Not on file    Attends meetings of clubs or organizations: Not on file    Relationship status: Not on file  . Intimate partner violence    Fear of current or ex partner: Not on file    Emotionally abused: Not on file    Physically abused: Not on file    Forced sexual activity: Not on file  Other Topics Concern  . Not on file  Social History Narrative  . Not on file      Review of Systems  Constitutional: Negative for activity change, chills, fatigue and unexpected weight change.       Stable weight since most recent visit.   HENT: Negative for congestion, postnasal drip, rhinorrhea, sneezing and sore throat.   Respiratory: Negative for cough, chest tightness, shortness of breath and wheezing.   Cardiovascular: Negative for chest pain and palpitations.  Gastrointestinal: Negative for  abdominal pain, constipation, diarrhea, nausea and vomiting.  Endocrine: Negative for cold intolerance, heat intolerance, polydipsia and polyuria.  Musculoskeletal: Positive for arthralgias. Negative for back pain, joint swelling and neck pain.       Persistent low back and hip pain. Takes tramadol twice daily when needed which does help the pain.  Recently developed some pain in both hands, much more severe in right hand. Pain is mostly at the base of the thumb. Tender to touch. Hurts more when she turns the hands over and gasps with the hand. No injury or trauma  noted.   Skin: Negative for rash.  Allergic/Immunologic: Negative for environmental allergies.  Neurological: Negative for dizziness, tremors, numbness and headaches.  Hematological: Negative for adenopathy. Does not bruise/bleed easily.  Psychiatric/Behavioral: Negative for behavioral problems (Depression), sleep disturbance and suicidal ideas. The patient is not nervous/anxious.     Today's Vitals   06/26/19 1026  BP: 129/68  Pulse: 79  Resp: 16  Temp: 97.6 F (36.4 C)  SpO2: 97%  Weight: 158 lb 3.2 oz (71.8 kg)  Height: 5\' 5"  (1.651 m)   Body mass index is 26.33 kg/m.  Physical Exam Vitals signs and nursing note reviewed.  Constitutional:      General: She is not in acute distress.    Appearance: Normal appearance. She is well-developed. She is not diaphoretic.  HENT:     Head: Normocephalic and atraumatic.     Mouth/Throat:     Pharynx: No oropharyngeal exudate.  Eyes:     Pupils: Pupils are equal, round, and reactive to light.  Neck:     Musculoskeletal: Normal range of motion and neck supple.     Thyroid: No thyromegaly.     Vascular: No carotid bruit or JVD.     Trachea: No tracheal deviation.  Cardiovascular:     Rate and Rhythm: Normal rate and regular rhythm.     Heart sounds: Normal heart sounds. No murmur. No friction rub. No gallop.   Pulmonary:     Effort: Pulmonary effort is normal. No  respiratory distress.     Breath sounds: Normal breath sounds. No wheezing or rales.  Chest:     Chest wall: No tenderness.  Abdominal:     Palpations: Abdomen is soft.  Musculoskeletal: Normal range of motion.       Arms:  Lymphadenopathy:     Cervical: No cervical adenopathy.  Skin:    General: Skin is warm and dry.  Neurological:     Mental Status: She is alert and oriented to person, place, and time.     Cranial Nerves: No cranial nerve deficit.  Psychiatric:        Behavior: Behavior normal.        Thought Content: Thought content normal.        Judgment: Judgment normal.   Assessment/Plan: 1. Essential hypertension Stable. Continue bp medication as prescribed   2. Overweight Continues to improve. May continue phentermine 37.5mg  tablets daily. Limit calorie intake to 1200 calories per day. Continue with increased physical activity.  - phentermine (ADIPEX-P) 37.5 MG tablet; Take 1 tablet (37.5 mg total) by mouth daily before breakfast.  Dispense: 30 tablet; Refill: 1  3. Gastroesophageal reflux disease without esophagitis Continue pantoprazole as prescribed   4. Pain in right hand Recommended use of wrist splint especially at night. Gentle ROM exercises should be pursued.   General Counseling: arvilla homsey understanding of the findings of todays visit and agrees with plan of treatment. I have discussed any further diagnostic evaluation that may be needed or ordered today. We also reviewed her medications today. she has been encouraged to call the office with any questions or concerns that should arise related to todays visit.   This patient was seen by [place] with Dr Lavera Guise as a part of collaborative care agreement  Meds ordered this encounter  Medications  . phentermine (ADIPEX-P) 37.5 MG tablet    Sig: Take 1 tablet (37.5 mg total) by mouth daily before breakfast.    Dispense:  30 tablet  Refill:  1    Order Specific Question:    Supervising Provider    Answer:   Lavera Guise [id number]    Time spent: 48 Minutes      Dr Lavera Guise Internal medicine

## 2019-07-10 DIAGNOSIS — M79641 Pain in right hand: Secondary | ICD-10-CM | POA: Insufficient documentation

## 2019-08-06 ENCOUNTER — Telehealth: Payer: Self-pay

## 2019-08-06 NOTE — Telephone Encounter (Signed)
CONFIRMED 08-08-19 OV AS VIRTUAL.

## 2019-08-08 ENCOUNTER — Encounter: Payer: Self-pay | Admitting: Nurse Practitioner

## 2019-08-08 ENCOUNTER — Ambulatory Visit (INDEPENDENT_AMBULATORY_CARE_PROVIDER_SITE_OTHER): Payer: Medicare Other | Admitting: Nurse Practitioner

## 2019-08-08 ENCOUNTER — Other Ambulatory Visit: Payer: Self-pay

## 2019-08-08 VITALS — BP 144/77 | HR 81 | Temp 97.5°F | Ht 65.5 in | Wt 162.0 lb

## 2019-08-08 DIAGNOSIS — I10 Essential (primary) hypertension: Secondary | ICD-10-CM

## 2019-08-08 DIAGNOSIS — E663 Overweight: Secondary | ICD-10-CM

## 2019-08-08 DIAGNOSIS — R229 Localized swelling, mass and lump, unspecified: Secondary | ICD-10-CM | POA: Diagnosis not present

## 2019-08-08 NOTE — Progress Notes (Signed)
[facility] [place], [place] [address]  Internal MEDICINE  Telephone Visit  Patient Name: Angie [name]  [id number]  [id number]  Date of Service: 08/08/2019  I connected with the patient at 12:18pm by webcam and verified the patients identity using two identifiers.   I discussed the limitations, risks, security and privacy concerns of performing an evaluation and management service by webcam and the availability of in person appointments. I also discussed with the patient that there may be a patient responsible charge related to the service.  The patient expressed understanding and agrees to proceed.    Chief Complaint  Patient presents with  . Telephone Screen  . Telephone Assessment  . Medical Management of Chronic Issues    weight managment    The patient has been contacted via webcam for follow up visit due to concerns for spread of novel coronavirus. She has noted a small, hard mass on right upper leg. She states it is about the size of a pea. She has noted it for a few months, but has not reported it until today. She feels as though it may be getting bigger.  She is also here to follow up regarding weight management. She is taking phentermine 37.5mg  tablets daily. She has had a 4 pound weight gain since she was last seen. She has been unable to exercise recently. Her mother was recently diagnosed with lung cancer and patient and her sister have been taking their mom back and forth to doctor's appointments.   She has no negative side effects from taking phentermine. Her blood pressure is well managed.       Current Medication: Outpatient Encounter Medications as of 08/08/2019  Medication Sig  . Biotin 1 MG CAPS Take 1,000 mcg by mouth daily.  . cyclobenzaprine (FLEXERIL) 10 MG tablet Take 10 mg by mouth as needed for muscle spasms.  . hydrocortisone (ANUSOL-HC) 25 MG suppository Place 1 suppository (25 mg total) rectally 2 (two) times daily.  .  hydrocortisone (PROCTOSOL HC) 2.5 % rectal cream Place 1 application rectally 2 (two) times daily.  Marland Kitchen lisinopril-hydrochlorothiazide (ZESTORETIC) 10-12.5 MG tablet Take 1 tab po daily  . pantoprazole (PROTONIX) 40 MG tablet Take 1 tablet (40 mg total) by mouth daily.  . phentermine (ADIPEX-P) 37.5 MG tablet Take 1 tablet (37.5 mg total) by mouth daily before breakfast.  . traMADol (ULTRAM) 50 MG tablet Take 2 tablets (100 mg total) by mouth 2 (two) times daily.   No facility-administered encounter medications on file as of 08/08/2019.    Surgical History: Past Surgical History:  Procedure Laterality Date  . COLONOSCOPY WITH PROPOFOL N/A 10/04/2016   Procedure: COLONOSCOPY WITH PROPOFOL;  Surgeon: Lollie Sails, MD;  Location: [facility] ENDOSCOPY;  Service: Endoscopy;  Laterality: N/A;  . ESOPHAGOGASTRODUODENOSCOPY (EGD) WITH PROPOFOL N/A 05/13/2016   Procedure: ESOPHAGOGASTRODUODENOSCOPY (EGD) WITH PROPOFOL;  Surgeon: Lollie Sails, MD;  Location: [facility] ENDOSCOPY;  Service: Endoscopy;  Laterality: N/A;  . ESOPHAGOGASTRODUODENOSCOPY (EGD) WITH PROPOFOL N/A 10/19/2018   Procedure: ESOPHAGOGASTRODUODENOSCOPY (EGD) WITH PROPOFOL;  Surgeon: Lollie Sails, MD;  Location: [facility] ENDOSCOPY;  Service: Endoscopy;  Laterality: N/A;  . JOINT REPLACEMENT Left 2015  . TONSILLECTOMY    . TONSILLECTOMY    . TOTAL KNEE ARTHROPLASTY      Medical History: Past Medical History:  Diagnosis Date  . Arthritis   . Esophagitis, reflux   . Gastritis   . Hepatitis C 2014   treated with Harvoni  . History of  colon polyps   . Hypertension   . Kidney stone   . Melanoma ([place])   . Osteoarthritis   . Sepsis due to urinary tract infection ([place]) 2014  . Thrombocytopenia, primary (HCC)    Dr Grayland Ormond    Family History: Family History  Problem Relation Age of Onset  . Liver cancer Father   . Cancer Father   . COPD Father   . Heart disease Father   . COPD Mother   . Hypertension Mother   . Diabetes  Sister   . Diabetes Paternal Grandmother   . Heart disease Paternal Grandmother   . Hypertension Paternal Grandmother   . Diabetes Paternal Grandfather   . Heart disease Paternal Grandfather   . Hypertension Paternal Grandfather   . Breast cancer Neg Hx     Social History   Socioeconomic History  . Marital status: Single    Spouse name: Not on file  . Number of children: Not on file  . Years of education: Not on file  . Highest education level: Not on file  Occupational History  . Not on file  Tobacco Use  . Smoking status: Former Smoker    Types: Cigarettes  . Smokeless tobacco: Never Used  Substance and Sexual Activity  . Alcohol use: Yes    Alcohol/week: 1.0 standard drinks    Types: 1 Glasses of wine per week    Comment: occasionally last week  . Drug use: No  . Sexual activity: Yes    Birth control/protection: None, Post-menopausal  Other Topics Concern  . Not on file  Social History Narrative  . Not on file   Social Determinants of Health   Financial Resource Strain:   . Difficulty of Paying Living Expenses: Not on file  Food Insecurity:   . Worried About Charity fundraiser in the Last Year: Not on file  . Ran Out of Food in the Last Year: Not on file  Transportation Needs:   . Lack of Transportation (Medical): Not on file  . Lack of Transportation (Non-Medical): Not on file  Physical Activity:   . Days of Exercise per Week: Not on file  . Minutes of Exercise per Session: Not on file  Stress:   . Feeling of Stress : Not on file  Social Connections:   . Frequency of Communication with Friends and Family: Not on file  . Frequency of Social Gatherings with Friends and Family: Not on file  . Attends Religious Services: Not on file  . Active Member of Clubs or Organizations: Not on file  . Attends Archivist Meetings: Not on file  . Marital Status: Not on file  Intimate Partner Violence:   . Fear of Current or Ex-Partner: Not on file  .  Emotionally Abused: Not on file  . Physically Abused: Not on file  . Sexually Abused: Not on file      Review of Systems  Constitutional: Negative for chills, fatigue and unexpected weight change.       Weight gain four pounds since last visit.   HENT: Negative for congestion, postnasal drip, rhinorrhea, sneezing and sore throat.   Respiratory: Negative for cough, chest tightness and shortness of breath.   Cardiovascular: Negative for chest pain and palpitations.  Gastrointestinal: Negative for abdominal pain, constipation, diarrhea, nausea and vomiting.  Musculoskeletal: Positive for arthralgias and myalgias. Negative for back pain, joint swelling and neck pain.  Skin: Negative for rash.       Has small, round,  hard mass on the right thigh. Tender and getting larger. She states it is about the size of a pea.   Allergic/Immunologic: Negative for environmental allergies.  Neurological: Negative for dizziness, tremors, numbness and headaches.  Hematological: Negative for adenopathy. Does not bruise/bleed easily.  Psychiatric/Behavioral: Negative for behavioral problems (Depression), sleep disturbance and suicidal ideas. The patient is not nervous/anxious.     Today's Vitals   08/08/19 1124  BP: (!) 144/77  Pulse: 81  Temp: (!) 97.5 F (36.4 C)  Weight: 162 lb (73.5 kg)  Height: 5' 5.5" (1.664 m)   Body mass index is 26.55 kg/m.  Observation/Objective:   The patient is alert and oriented. She is pleasant and answers all questions appropriately. Breathing is non-labored. She is in no acute distress at this time. There is flesh colored superficial mass on right anterior thigh. It is tender when she touches it. It is approximately 37mm in diameter.    Assessment/Plan: 1. Local superficial swelling, mass or lump Pea sized mass located on right anterior thigh. Refer to dermatology for further evaluation and treatment.  - Ambulatory referral to Dermatology  2. Essential  hypertension Stable. Continue bp medication as prescribed   3. Overweight May continue to take phentermine daily. Limit calorie intake to 1200 calories per day and restart daily exercise.   General Counseling: neppie boyter understanding of the findings of today's phone visit and agrees with plan of treatment. I have discussed any further diagnostic evaluation that may be needed or ordered today. We also reviewed her medications today. she has been encouraged to call the office with any questions or concerns that should arise related to todays visit.   There is a liability release in patients' chart. There has been a 10 minute discussion about the side effects including but not limited to elevated blood pressure, anxiety, lack of sleep and dry mouth. Pt understands and will like to start/continue on appetite suppressant at this time. There will be one month RX given at the time of visit with proper follow up. [place] diet plan with restricted calories is given to the pt. Pt understands and agrees with  plan of treatment  This patient was seen by Leretha Pol FNP Collaboration with Dr Lavera Guise as a part of collaborative care agreement  Orders Placed This Encounter  Procedures  . Ambulatory referral to Dermatology     Time spent: 29 Minutes    Dr Lavera Guise Internal medicine

## 2019-08-23 DIAGNOSIS — J0100 Acute maxillary sinusitis, unspecified: Secondary | ICD-10-CM | POA: Diagnosis not present

## 2019-08-23 DIAGNOSIS — I10 Essential (primary) hypertension: Secondary | ICD-10-CM | POA: Diagnosis not present

## 2019-09-05 ENCOUNTER — Ambulatory Visit: Payer: Medicare Other | Admitting: Nurse Practitioner

## 2019-09-11 ENCOUNTER — Other Ambulatory Visit: Payer: Self-pay

## 2019-09-11 DIAGNOSIS — I10 Essential (primary) hypertension: Secondary | ICD-10-CM

## 2019-09-11 MED ORDER — LISINOPRIL-HYDROCHLOROTHIAZIDE 10-12.5 MG PO TABS: ORAL_TABLET | ORAL | 5 refills | Status: DC

## 2019-09-13 ENCOUNTER — Encounter: Payer: Self-pay | Admitting: Nurse Practitioner

## 2019-09-16 ENCOUNTER — Other Ambulatory Visit: Payer: Self-pay

## 2019-09-16 DIAGNOSIS — I10 Essential (primary) hypertension: Secondary | ICD-10-CM

## 2019-09-16 MED ORDER — LISINOPRIL-HYDROCHLOROTHIAZIDE 10-12.5 MG PO TABS: ORAL_TABLET | ORAL | 5 refills | Status: DC

## 2019-09-19 ENCOUNTER — Ambulatory Visit (INDEPENDENT_AMBULATORY_CARE_PROVIDER_SITE_OTHER): Payer: Medicare Other | Admitting: Nurse Practitioner

## 2019-09-19 ENCOUNTER — Encounter: Payer: Self-pay | Admitting: Nurse Practitioner

## 2019-09-19 ENCOUNTER — Other Ambulatory Visit: Payer: Self-pay

## 2019-09-19 VITALS — BP 154/94 | HR 93 | Temp 97.8°F | Resp 16 | Ht 65.0 in | Wt 168.4 lb

## 2019-09-19 DIAGNOSIS — R300 Dysuria: Secondary | ICD-10-CM

## 2019-09-19 DIAGNOSIS — Z0001 Encounter for general adult medical examination with abnormal findings: Secondary | ICD-10-CM | POA: Diagnosis not present

## 2019-09-19 DIAGNOSIS — G5601 Carpal tunnel syndrome, right upper limb: Secondary | ICD-10-CM

## 2019-09-19 DIAGNOSIS — M79641 Pain in right hand: Secondary | ICD-10-CM | POA: Diagnosis not present

## 2019-09-19 DIAGNOSIS — I10 Essential (primary) hypertension: Secondary | ICD-10-CM

## 2019-09-19 NOTE — Progress Notes (Signed)
[facility] [place], [place] [address]  Internal MEDICINE  Office Visit Note  Patient Name: Angie [name]  [id number]  [id number]  Date of Service: 09/21/2019   Pt is here for routine health maintenance examination   Chief Complaint  Patient presents with  . Telephone Assessment  . Telephone Screen  . Medicare Wellness  . Hypertension  . WEIGHT MANAGEMENT  . Gastroesophageal Reflux  . Hand Pain    pain in right thumb, ongoing, hand brace does not help, wheres one at night and day     The patient presents for health maintenance exam. Today, she is c/o  She moderate carpal tunnel in right hand. Not getting better with conservative therapy. Has been using carpal tunnel brace at night, every night for some time, and there has been no improvement.  Her blood pressure is mildly elevated today. Admits to increased stress since her mother has been sick. She has slacked on exercise and has not been making proper diet choices. She has held her phentermine for several days because she feels blood pressure has been elevated.  She is due to have routine, fasting labs done.   Current Medication: Outpatient Encounter Medications as of 09/19/2019  Medication Sig  . Biotin 1 MG CAPS Take 1,000 mcg by mouth daily.  . cyclobenzaprine (FLEXERIL) 10 MG tablet Take 10 mg by mouth as needed for muscle spasms.  . hydrocortisone (ANUSOL-HC) 25 MG suppository Place 1 suppository (25 mg total) rectally 2 (two) times daily.  . hydrocortisone (PROCTOSOL HC) 2.5 % rectal cream Place 1 application rectally 2 (two) times daily.  Marland Kitchen lisinopril-hydrochlorothiazide (ZESTORETIC) 10-12.5 MG tablet Take 1 tab po daily  . pantoprazole (PROTONIX) 40 MG tablet Take 1 tablet (40 mg total) by mouth daily.  . phentermine (ADIPEX-P) 37.5 MG tablet Take 1 tablet (37.5 mg total) by mouth daily before breakfast.  . traMADol (ULTRAM) 50 MG tablet Take 2 tablets (100 mg total) by mouth 2 (two)  times daily.   No facility-administered encounter medications on file as of 09/19/2019.    Surgical History: Past Surgical History:  Procedure Laterality Date  . COLONOSCOPY WITH PROPOFOL N/A 10/04/2016   Procedure: COLONOSCOPY WITH PROPOFOL;  Surgeon: Lollie Sails, MD;  Location[facility] ENDOSCOPY;  Service: Endoscopy;  Laterality: N/A;  . ESOPHAGOGASTRODUODENOSCOPY (EGD) WITH PROPOFOL N/A 05/13/2016   Procedure: ESOPHAGOGASTRODUODENOSCOPY (EGD) WITH PROPOFOL;  Surgeon: Lollie Sails, MD;  Location: [facility] ENDOSCOPY;  Service: Endoscopy;  Laterality: N/A;  . ESOPHAGOGASTRODUODENOSCOPY (EGD) WITH PROPOFOL N/A 10/19/2018   Procedure: ESOPHAGOGASTRODUODENOSCOPY (EGD) WITH PROPOFOL;  Surgeon: Lollie Sails, MD;  Location: [facility] ENDOSCOPY;  Service: Endoscopy;  Laterality: N/A;  . JOINT REPLACEMENT Left 2015  . TONSILLECTOMY    . TONSILLECTOMY    . TOTAL KNEE ARTHROPLASTY      Medical History: Past Medical History:  Diagnosis Date  . Arthritis   . Esophagitis, reflux   . Gastritis   . Hepatitis C 2014   treated with Harvoni  . History of colon polyps   . Hypertension   . Kidney stone   . Melanoma ([place])   . Osteoarthritis   . Sepsis due to urinary tract infection ([place]) 2014  . Thrombocytopenia, primary (HCC)    Dr Grayland Ormond    Family History: Family History  Problem Relation Age of Onset  . Liver cancer Father   . Cancer Father   . COPD Father   . Heart disease Father   . COPD Mother   .  Hypertension Mother   . Diabetes Sister   . Diabetes Paternal Grandmother   . Heart disease Paternal Grandmother   . Hypertension Paternal Grandmother   . Diabetes Paternal Grandfather   . Heart disease Paternal Grandfather   . Hypertension Paternal Grandfather   . Breast cancer Neg Hx       Review of Systems  Constitutional: Negative for chills, fatigue and unexpected weight change.       Weight gain six pounds since her last visit.   HENT: Negative for congestion,  postnasal drip, rhinorrhea, sneezing and sore throat.   Respiratory: Negative for cough, chest tightness and shortness of breath.   Cardiovascular: Negative for chest pain and palpitations.  Gastrointestinal: Negative for abdominal pain, constipation, diarrhea, nausea and vomiting.  Endocrine: Negative for cold intolerance, heat intolerance, polydipsia and polyuria.  Genitourinary: Negative for dysuria, frequency and urgency.  Musculoskeletal: Positive for arthralgias and myalgias. Negative for back pain, joint swelling and neck pain.       There is pain in her right thumb, adjacent to the right wrist. Hurts more with exertion.   Skin: Negative for rash.  Allergic/Immunologic: Negative for environmental allergies.  Neurological: Negative for dizziness, tremors, numbness and headaches.  Hematological: Negative for adenopathy. Does not bruise/bleed easily.  Psychiatric/Behavioral: Negative for behavioral problems (Depression), sleep disturbance and suicidal ideas. The patient is not nervous/anxious.      Today's Vitals   09/19/19 1459  BP: (!) 154/94  Pulse: 93  Resp: 16  Temp: 97.8 F (36.6 C)  SpO2: 97%  Weight: 168 lb 6.4 oz (76.4 kg)  Height: 5\' 5"  (1.651 m)   Body mass index is 28.02 kg/m.  Physical Exam Vitals and nursing note reviewed.  Constitutional:      General: She is not in acute distress.    Appearance: Normal appearance. She is well-developed. She is not diaphoretic.  HENT:     Head: Normocephalic and atraumatic.     Nose: Nose normal.     Mouth/Throat:     Pharynx: No oropharyngeal exudate.  Eyes:     Conjunctiva/sclera: Conjunctivae normal.     Pupils: Pupils are equal, round, and reactive to light.  Neck:     Thyroid: No thyromegaly.     Vascular: No carotid bruit or JVD.     Trachea: No tracheal deviation.  Cardiovascular:     Rate and Rhythm: Normal rate and regular rhythm.     Pulses: Normal pulses.     Heart sounds: Normal heart sounds. No murmur.  No friction rub. No gallop.   Pulmonary:     Effort: Pulmonary effort is normal. No respiratory distress.     Breath sounds: Normal breath sounds. No wheezing or rales.  Chest:     Chest wall: No tenderness.     Breasts:        Right: Normal. No swelling, bleeding, inverted nipple, mass, nipple discharge, skin change or tenderness.        Left: Normal. No swelling, bleeding, inverted nipple, mass, nipple discharge, skin change or tenderness.  Abdominal:     General: Bowel sounds are normal.     Palpations: Abdomen is soft.     Tenderness: There is no abdominal tenderness.  Musculoskeletal:        General: Normal range of motion.     Cervical back: Normal range of motion and neck supple.     Comments: Tenderness of right wrist, adjacent to the proximal joint of the right thumb. Pain is  more severe with pronation and supination of the right hand. Grips are equal bilaterally.   Lymphadenopathy:     Cervical: No cervical adenopathy.     Upper Body:     Right upper body: No axillary adenopathy.     Left upper body: No axillary adenopathy.  Skin:    General: Skin is warm and dry.  Neurological:     Mental Status: She is alert and oriented to person, place, and time.     Cranial Nerves: No cranial nerve deficit.  Psychiatric:        Mood and Affect: Mood normal.        Behavior: Behavior normal.        Thought Content: Thought content normal.        Judgment: Judgment normal.    Depression screen [name] 2/9 09/19/2019 08/08/2019 05/14/2019 02/15/2019 12/31/2018  Decreased Interest 0 0 0 0 0  Down, Depressed, Hopeless 0 0 0 0 0  PHQ - 2 Score 0 0 0 0 0    Functional Status Survey: Is the patient deaf or have difficulty hearing?: No Does the patient have difficulty seeing, even when wearing glasses/contacts?: No Does the patient have difficulty concentrating, remembering, or making decisions?: No Does the patient have difficulty walking or climbing stairs?: No Does the patient have  difficulty dressing or bathing?: No Does the patient have difficulty doing errands alone such as visiting a doctor's office or shopping?: No  MMSE - Mini Mental State Exam 09/19/2019 09/07/2018  Orientation to time 5 5  Orientation to Place 5 5  Registration 3 3  Attention/ Calculation 5 5  Recall 3 3  Language- name 2 objects 2 2  Language- repeat 1 1  Language- follow 3 step command 3 3  Language- read & follow direction 1 1  Write a sentence 1 1  Copy design 1 1  Total score 30 30    Fall Risk  09/19/2019 08/08/2019 05/14/2019 02/15/2019 12/31/2018  Falls in the past year? 0 0 0 0 0  Number falls in past yr: - - - 0 0  Injury with Fall? - - - 0 0      LABS: Recent Results (from the past 2160 hour(s))  UA/M w/rflx Culture, Routine     Status: None   Collection Time: 09/19/19 12:00 AM   Specimen: Urine   URINE  Result Value Ref Range   Specific Gravity, UA 1.022 1.005 - 1.030   pH, UA 5.0 5.0 - 7.5   Color, UA Yellow Yellow   Appearance Ur Clear Clear   Leukocytes,UA Negative Negative   Protein,UA Negative Negative/Trace   Glucose, UA Negative Negative   Ketones, UA Negative Negative   RBC, UA Negative Negative   Bilirubin, UA Negative Negative   Urobilinogen, Ur 0.2 0.2 - 1.0 mg/dL   Nitrite, UA Negative Negative   Microscopic Examination Comment     Comment: Microscopic follows if indicated.   Microscopic Examination See below:     Comment: Microscopic was indicated and was performed.   Urinalysis Reflex Comment     Comment: This specimen will not reflex to a Urine Culture.  Microscopic Examination     Status: None   Collection Time: 09/19/19 12:00 AM   URINE  Result Value Ref Range   WBC, UA 0-5 0 - 5 /hpf   RBC 0-2 0 - 2 /hpf   Epithelial Cells (non renal) 0-10 0 - 10 /hpf   Casts None seen None seen /lpf  Mucus, UA Present Not Estab.   Bacteria, UA None seen None seen/Few    Assessment/Plan: 1. Encounter for general adult medical examination with  abnormal findings Annual health maintenance exam today. Lab slip given to obtain routine, fasting labs.  2. Essential hypertension Stable. Continue bp medication as prescribed   3. Carpal tunnel syndrome of right wrist Conservative therapies not effective. Refer to orthopedics for further evaluation.  - Ambulatory referral to Orthopedic Surgery  4. Pain in right hand Conservative therapies not effective. Refer to orthopedics for further evaluation.  - Ambulatory referral to Orthopedic Surgery  5. Dysuria - UA/M w/rflx Culture, Routine  General Counseling: ryliee mehall understanding of the findings of todays visit and agrees with plan of treatment. I have discussed any further diagnostic evaluation that may be needed or ordered today. We also reviewed her medications today. she has been encouraged to call the office with any questions or concerns that should arise related to todays visit.    Counseling:  Hypertension Counseling:   The following hypertensive lifestyle modification were recommended and discussed:  1. Limiting alcohol intake to less than 1 oz/day of ethanol:(24 oz of beer or 8 oz of wine or 2 oz of 100-proof whiskey). 2. Take baby ASA 81 mg daily. 3. Importance of regular aerobic exercise and losing weight. 4. Reduce dietary saturated fat and cholesterol intake for overall cardiovascular health. 5. Maintaining adequate dietary potassium, calcium, and magnesium intake. 6. Regular monitoring of the blood pressure. 7. Reduce sodium intake to less than 100 mmol/day (less than 2.3 gm of sodium or less than 6 gm of sodium choride)   This patient was seen by [place] with Dr Lavera Guise as a part of collaborative care agreement  Orders Placed This Encounter  Procedures  . Microscopic Examination  . UA/M w/rflx Culture, Routine  . Ambulatory referral to Orthopedic Surgery     Total time spent: 45 Minutes  Time spent includes review of  chart, medications, test results, and follow up plan with the patient.     Lavera Guise, MD  Internal Medicine

## 2019-09-20 LAB — MICROSCOPIC EXAMINATION
Bacteria, UA: NONE SEEN
Casts: NONE SEEN /lpf

## 2019-09-20 LAB — UA/M W/RFLX CULTURE, ROUTINE
Bilirubin, UA: NEGATIVE
Glucose, UA: NEGATIVE
Ketones, UA: NEGATIVE
Leukocytes,UA: NEGATIVE
Nitrite, UA: NEGATIVE
Protein,UA: NEGATIVE
RBC, UA: NEGATIVE
Specific Gravity, UA: 1.022 (ref 1.005–1.030)
Urobilinogen, Ur: 0.2 mg/dL (ref 0.2–1.0)
pH, UA: 5 (ref 5.0–7.5)

## 2019-09-21 DIAGNOSIS — G5601 Carpal tunnel syndrome, right upper limb: Secondary | ICD-10-CM | POA: Insufficient documentation

## 2019-09-21 DIAGNOSIS — Z0001 Encounter for general adult medical examination with abnormal findings: Secondary | ICD-10-CM | POA: Insufficient documentation

## 2019-10-02 DIAGNOSIS — M1811 Unilateral primary osteoarthritis of first carpometacarpal joint, right hand: Secondary | ICD-10-CM | POA: Diagnosis not present

## 2019-10-02 DIAGNOSIS — M1812 Unilateral primary osteoarthritis of first carpometacarpal joint, left hand: Secondary | ICD-10-CM | POA: Diagnosis not present

## 2019-10-07 ENCOUNTER — Other Ambulatory Visit
Admission: RE | Admit: 2019-10-07 | Discharge: 2019-10-07 | Disposition: A | Discharge status: Home / Self Care | Payer: Medicare Other | Source: Ambulatory Visit | Attending: Nurse Practitioner | Admitting: Nurse Practitioner

## 2019-10-07 ENCOUNTER — Other Ambulatory Visit: Payer: Self-pay

## 2019-10-07 DIAGNOSIS — E7800 Pure hypercholesterolemia, unspecified: Secondary | ICD-10-CM | POA: Insufficient documentation

## 2019-10-07 DIAGNOSIS — E559 Vitamin D deficiency, unspecified: Secondary | ICD-10-CM | POA: Insufficient documentation

## 2019-10-07 DIAGNOSIS — Z0000 Encounter for general adult medical examination without abnormal findings: Secondary | ICD-10-CM | POA: Insufficient documentation

## 2019-10-07 DIAGNOSIS — I10 Essential (primary) hypertension: Secondary | ICD-10-CM | POA: Diagnosis not present

## 2019-10-07 LAB — VITAMIN D 25 HYDROXY (VIT D DEFICIENCY, FRACTURES): Vit D, 25-Hydroxy: 26.33 ng/mL — ABNORMAL LOW (ref 30–100)

## 2019-10-07 LAB — COMPREHENSIVE METABOLIC PANEL
ALT: 42 U/L (ref 0–44)
AST: 32 U/L (ref 15–41)
Albumin: 4.3 g/dL (ref 3.5–5.0)
Alkaline Phosphatase: 54 U/L (ref 38–126)
Anion gap: 10 (ref 5–15)
BUN: 27 mg/dL — ABNORMAL HIGH (ref 8–23)
CO2: 21 mmol/L — ABNORMAL LOW (ref 22–32)
Calcium: 9.2 mg/dL (ref 8.9–10.3)
Chloride: 108 mmol/L (ref 98–111)
Creatinine, Ser: 0.52 mg/dL (ref 0.44–1.00)
GFR calc Af Amer: >60 mL/min (ref >60)
GFR calc non Af Amer: >60 mL/min (ref >60)
Glucose, Bld: 112 mg/dL — ABNORMAL HIGH (ref 70–99)
Potassium: 3.9 mmol/L (ref 3.5–5.1)
Sodium: 139 mmol/L (ref 135–145)
Total Bilirubin: 0.4 mg/dL (ref 0.3–1.2)
Total Protein: 7.8 g/dL (ref 6.5–8.1)

## 2019-10-07 LAB — T4, FREE: Free T4: 0.69 ng/dL (ref 0.61–1.12)

## 2019-10-07 LAB — CBC
HCT: 44 % (ref 36.0–46.0)
Hemoglobin: 14.8 g/dL (ref 12.0–15.0)
MCH: 32.7 pg (ref 26.0–34.0)
MCHC: 33.6 g/dL (ref 30.0–36.0)
MCV: 97.1 fL (ref 80.0–100.0)
Platelets: 167 10*3/uL (ref 150–400)
RBC: 4.53 MIL/uL (ref 3.87–5.11)
RDW: 12.4 % (ref 11.5–15.5)
WBC: 6.7 10*3/uL (ref 4.0–10.5)
nRBC: 0 % (ref 0.0–0.2)

## 2019-10-07 LAB — LIPID PANEL
Cholesterol: 252 mg/dL — ABNORMAL HIGH (ref 0–200)
HDL: 87 mg/dL (ref >40)
LDL Cholesterol: 133 mg/dL — ABNORMAL HIGH (ref 0–99)
Total CHOL/HDL Ratio: 2.9 RATIO
Triglycerides: 162 mg/dL — ABNORMAL HIGH (ref <150)
VLDL: 32 mg/dL (ref 0–40)

## 2019-10-07 LAB — TSH: TSH: 1.979 u[IU]/mL (ref 0.350–4.500)

## 2019-10-09 NOTE — Progress Notes (Signed)
Discuss with patient at visit 10/18/2019

## 2019-10-18 ENCOUNTER — Ambulatory Visit: Payer: Medicare Other | Admitting: Nurse Practitioner

## 2019-10-18 ENCOUNTER — Telehealth: Payer: Self-pay

## 2019-10-18 NOTE — Telephone Encounter (Signed)
LMOM FOR PATIENT TO CONFIRM AND SCREEN FOR 10-22-19 ADVISED ON VM TO BRING BP LOG AS WELL.

## 2019-10-22 ENCOUNTER — Other Ambulatory Visit: Payer: Self-pay

## 2019-10-22 ENCOUNTER — Encounter: Payer: Self-pay | Admitting: Nurse Practitioner

## 2019-10-22 ENCOUNTER — Ambulatory Visit (INDEPENDENT_AMBULATORY_CARE_PROVIDER_SITE_OTHER): Payer: Medicare Other | Admitting: Nurse Practitioner

## 2019-10-22 VITALS — BP 145/71 | HR 85 | Temp 97.5°F | Resp 16 | Ht 65.0 in | Wt 166.2 lb

## 2019-10-22 DIAGNOSIS — M5441 Lumbago with sciatica, right side: Secondary | ICD-10-CM

## 2019-10-22 DIAGNOSIS — I10 Essential (primary) hypertension: Secondary | ICD-10-CM

## 2019-10-22 DIAGNOSIS — E559 Vitamin D deficiency, unspecified: Secondary | ICD-10-CM | POA: Diagnosis not present

## 2019-10-22 DIAGNOSIS — G8929 Other chronic pain: Secondary | ICD-10-CM

## 2019-10-22 DIAGNOSIS — E663 Overweight: Secondary | ICD-10-CM | POA: Diagnosis not present

## 2019-10-22 DIAGNOSIS — M5442 Lumbago with sciatica, left side: Secondary | ICD-10-CM

## 2019-10-22 MED ORDER — PHENTERMINE HCL 37.5 MG PO TABS: 37.5000 mg | ORAL_TABLET | Freq: Every day | ORAL | 1 refills | Status: DC

## 2019-10-22 MED ORDER — TRAMADOL HCL 50 MG PO TABS: 100.0000 mg | ORAL_TABLET | Freq: Two times a day (BID) | ORAL | 2 refills | Status: DC

## 2019-10-22 MED ORDER — ERGOCALCIFEROL 1.25 MG (50000 UT) PO CAPS: 50000.0000 [IU] | ORAL_CAPSULE | ORAL | 5 refills | Status: DC

## 2019-10-22 NOTE — Progress Notes (Signed)
[facility] [place], [place] [address]  Internal MEDICINE  Office Visit Note  Patient Name: Angie [name]  [id number]  [id number]  Date of Service: 10/23/2019  Chief Complaint  Patient presents with  . Hypertension  . Arthritis  . Follow-up    review labs   . Shoulder Pain    right shoulder is aching for about a month now   . Medication Refill    tramadol     The patient is here for follow up of weight loss. Currently taking phentermine to help curb appetite. Has had two pound weight loss since her last visit. She states that she has restarted her exercise program. She is carefully watching her calorie intake. Blood pressure is doing well. She is monitoring it frequently at home and has been stable. She has had no negative side effects associated with taking phentermine.  She states that she has been having some pain in her right shoulder. Hurts with exertion. ROM and strength are slightly reduced due to the pain. Has been bothering her for a few weeks. She denies injury or trauma to the right shoulder or arm. She takes tramadol as needed for pain related to her hip and generalized arthritic pain. She does need to have a refill for this as well.  She had routine, fasting labs done prior to this visit. Her LDL and total cholesterol were mildly elevated, more so than at prior checks. She also has vitamin d deficiency.       Current Medication: Outpatient Encounter Medications as of 10/22/2019  Medication Sig  . Biotin 1 MG CAPS Take 1,000 mcg by mouth daily.  . cyclobenzaprine (FLEXERIL) 10 MG tablet Take 10 mg by mouth as needed for muscle spasms.  . hydrocortisone (ANUSOL-HC) 25 MG suppository Place 1 suppository (25 mg total) rectally 2 (two) times daily.  . hydrocortisone (PROCTOSOL HC) 2.5 % rectal cream Place 1 application rectally 2 (two) times daily.  Marland Kitchen lisinopril-hydrochlorothiazide (ZESTORETIC) 10-12.5 MG tablet Take 1 tab po daily  .  pantoprazole (PROTONIX) 40 MG tablet Take 1 tablet (40 mg total) by mouth daily.  . phentermine (ADIPEX-P) 37.5 MG tablet Take 1 tablet (37.5 mg total) by mouth daily before breakfast.  . traMADol (ULTRAM) 50 MG tablet Take 2 tablets (100 mg total) by mouth 2 (two) times daily.  . [DISCONTINUED] phentermine (ADIPEX-P) 37.5 MG tablet Take 1 tablet (37.5 mg total) by mouth daily before breakfast.  . [DISCONTINUED] traMADol (ULTRAM) 50 MG tablet Take 2 tablets (100 mg total) by mouth 2 (two) times daily.  . ergocalciferol (DRISDOL) 1.25 MG (50000 UT) capsule Take 1 capsule (50,000 Units total) by mouth once a week.   No facility-administered encounter medications on file as of 10/22/2019.    Surgical History: Past Surgical History:  Procedure Laterality Date  . COLONOSCOPY WITH PROPOFOL N/A 10/04/2016   Procedure: COLONOSCOPY WITH PROPOFOL;  Surgeon: Lollie Sails, MD;  Location: [facility] ENDOSCOPY;  Service: Endoscopy;  Laterality: N/A;  . ESOPHAGOGASTRODUODENOSCOPY (EGD) WITH PROPOFOL N/A 05/13/2016   Procedure: ESOPHAGOGASTRODUODENOSCOPY (EGD) WITH PROPOFOL;  Surgeon: Lollie Sails, MD;  Location: [facility] ENDOSCOPY;  Service: Endoscopy;  Laterality: N/A;  . ESOPHAGOGASTRODUODENOSCOPY (EGD) WITH PROPOFOL N/A 10/19/2018   Procedure: ESOPHAGOGASTRODUODENOSCOPY (EGD) WITH PROPOFOL;  Surgeon: Lollie Sails, MD;  Location: [facility] ENDOSCOPY;  Service: Endoscopy;  Laterality: N/A;  . JOINT REPLACEMENT Left 2015  . TONSILLECTOMY    . TONSILLECTOMY    . TOTAL KNEE ARTHROPLASTY  Medical History: Past Medical History:  Diagnosis Date  . Arthritis   . Esophagitis, reflux   . Gastritis   . Hepatitis C 2014   treated with Harvoni  . History of colon polyps   . Hypertension   . Kidney stone   . Melanoma ([place])   . Osteoarthritis   . Sepsis due to urinary tract infection ([place]) 2014  . Thrombocytopenia, primary (H[facility])    Dr Grayland Ormond    Family History: Family History  Problem Relation Age  of Onset  . Liver cancer Father   . Cancer Father   . COPD Father   . Heart disease Father   . COPD Mother   . Hypertension Mother   . Diabetes Sister   . Diabetes Paternal Grandmother   . Heart disease Paternal Grandmother   . Hypertension Paternal Grandmother   . Diabetes Paternal Grandfather   . Heart disease Paternal Grandfather   . Hypertension Paternal Grandfather   . Breast cancer Neg Hx     Social History   Socioeconomic History  . Marital status: Single    Spouse name: Not on file  . Number of children: Not on file  . Years of education: Not on file  . Highest education level: Not on file  Occupational History  . Not on file  Tobacco Use  . Smoking status: Former Smoker    Types: Cigarettes  . Smokeless tobacco: Never Used  Substance and Sexual Activity  . Alcohol use: Yes    Alcohol/week: 1.0 standard drinks    Types: 1 Glasses of wine per week    Comment: occasionally   . Drug use: No  . Sexual activity: Yes    Birth control/protection: None, Post-menopausal  Other Topics Concern  . Not on file  Social History Narrative  . Not on file   Social Determinants of Health   Financial Resource Strain:   . Difficulty of Paying Living Expenses: Not on file  Food Insecurity:   . Worried About Charity fundraiser in the Last Year: Not on file  . Ran Out of Food in the Last Year: Not on file  Transportation Needs:   . Lack of Transportation (Medical): Not on file  . Lack of Transportation (Non-Medical): Not on file  Physical Activity:   . Days of Exercise per Week: Not on file  . Minutes of Exercise per Session: Not on file  Stress:   . Feeling of Stress : Not on file  Social Connections:   . Frequency of Communication with Friends and Family: Not on file  . Frequency of Social Gatherings with Friends and Family: Not on file  . Attends Religious Services: Not on file  . Active Member of Clubs or Organizations: Not on file  . Attends Archivist  Meetings: Not on file  . Marital Status: Not on file  Intimate Partner Violence:   . Fear of Current or Ex-Partner: Not on file  . Emotionally Abused: Not on file  . Physically Abused: Not on file  . Sexually Abused: Not on file      Review of Systems  Constitutional: Negative for chills, fatigue and unexpected weight change.       Weight loss tow pounds since her last visit   HENT: Negative for congestion, postnasal drip, rhinorrhea, sneezing and sore throat.   Respiratory: Negative for cough, chest tightness and shortness of breath.   Cardiovascular: Negative for chest pain and palpitations.  Gastrointestinal: Negative for abdominal pain,  constipation, diarrhea, nausea and vomiting.  Endocrine: Negative for cold intolerance, heat intolerance, polydipsia and polyuria.  Musculoskeletal: Positive for arthralgias and myalgias. Negative for back pain, joint swelling and neck pain.       Right shoulder tenderness. Hurts more with extended range of motion, however, ROM not limited. No injury or trauma noted.   Skin: Negative for rash.  Allergic/Immunologic: Negative for environmental allergies.  Neurological: Negative for dizziness, tremors, numbness and headaches.  Hematological: Negative for adenopathy. Does not bruise/bleed easily.  Psychiatric/Behavioral: Negative for behavioral problems (Depression), sleep disturbance and suicidal ideas. The patient is not nervous/anxious.     Today's Vitals   10/22/19 1409  BP: (!) 145/71  Pulse: 85  Resp: 16  Temp: (!) 97.5 F (36.4 C)  SpO2: 97%  Weight: 166 lb 3.2 oz (75.4 kg)  Height: 5\' 5"  (1.651 m)   Body mass index is 27.66 kg/m.  Physical Exam Vitals and nursing note reviewed.  Constitutional:      General: She is not in acute distress.    Appearance: Normal appearance. She is well-developed. She is not diaphoretic.  HENT:     Head: Normocephalic and atraumatic.     Nose: Nose normal.     Mouth/Throat:     Pharynx: No  oropharyngeal exudate.  Eyes:     Pupils: Pupils are equal, round, and reactive to light.  Neck:     Thyroid: No thyromegaly.     Vascular: No carotid bruit or JVD.     Trachea: No tracheal deviation.  Cardiovascular:     Rate and Rhythm: Normal rate and regular rhythm.     Heart sounds: Normal heart sounds. No murmur. No friction rub. No gallop.   Pulmonary:     Effort: Pulmonary effort is normal. No respiratory distress.     Breath sounds: Normal breath sounds. No wheezing or rales.  Chest:     Chest wall: No tenderness.  Abdominal:     Palpations: Abdomen is soft.  Musculoskeletal:        General: Normal range of motion.     Cervical back: Normal range of motion and neck supple.     Comments: Right shoulder tenderness. ROM and strength are currently intact. Chronic low back and bilateral hip pain. This is more severe when sitting for long periods of time.   Lymphadenopathy:     Cervical: No cervical adenopathy.  Skin:    General: Skin is warm and dry.  Neurological:     Mental Status: She is alert and oriented to person, place, and time.     Cranial Nerves: No cranial nerve deficit.  Psychiatric:        Behavior: Behavior normal.        Thought Content: Thought content normal.        Judgment: Judgment normal.    Assessment/Plan: 1. Essential hypertension Stable. Continue bp medication as prescribed   2. Chronic low back pain with bilateral sciatica, unspecified back pain laterality May continue to take tramadol as needed for moderate to severe pain. New prescription sent to her pharmacy today.  - traMADol (ULTRAM) 50 MG tablet; Take 2 tablets (100 mg total) by mouth 2 (two) times daily.  Dispense: 120 tablet; Refill: 2  3. Overweight Improving. May continue phentermine 37.5mg  tablets daily. Limit calorie intake to 1200 calories per day and incorporate exercise into daily routine.  - phentermine (ADIPEX-P) 37.5 MG tablet; Take 1 tablet (37.5 mg total) by mouth daily  before breakfast.  Dispense: 30 tablet; Refill: 1  4. Vitamin D deficiency Drisdol weekly.  - ergocalciferol (DRISDOL) 1.25 MG (50000 UT) capsule; Take 1 capsule (50,000 Units total) by mouth once a week.  Dispense: 4 capsule; Refill: 5  General Counseling: Harshitha verbalizes understanding of the findings of todays visit and agrees with plan of treatment. I have discussed any further diagnostic evaluation that may be needed or ordered today. We also reviewed her medications today. she has been encouraged to call the office with any questions or concerns that should arise related to todays visit.   This patient was seen by [place] with Dr Lavera Guise as a part of collaborative care agreement  Meds ordered this encounter  Medications  . ergocalciferol (DRISDOL) 1.25 MG (50000 UT) capsule    Sig: Take 1 capsule (50,000 Units total) by mouth once a week.    Dispense:  4 capsule    Refill:  5    Order Specific Question:   Supervising Provider    Answer:   Lavera Guise [id number]  . phentermine (ADIPEX-P) 37.5 MG tablet    Sig: Take 1 tablet (37.5 mg total) by mouth daily before breakfast.    Dispense:  30 tablet    Refill:  1    Order Specific Question:   Supervising Provider    Answer:   Lavera Guise [place]  . traMADol (ULTRAM) 50 MG tablet    Sig: Take 2 tablets (100 mg total) by mouth 2 (two) times daily.    Dispense:  120 tablet    Refill:  2    Order Specific Question:   Supervising Provider    Answer:   Lavera Guise [id number]    Total time spent: 30 Minutes  Time spent includes review of chart, medications, test results, and follow up plan with the patient.      Dr Lavera Guise Internal medicine

## 2019-10-23 DIAGNOSIS — E559 Vitamin D deficiency, unspecified: Secondary | ICD-10-CM | POA: Insufficient documentation

## 2019-10-30 DIAGNOSIS — M75101 Unspecified rotator cuff tear or rupture of right shoulder, not specified as traumatic: Secondary | ICD-10-CM | POA: Diagnosis not present

## 2019-11-29 ENCOUNTER — Telehealth: Payer: Self-pay

## 2019-11-29 NOTE — Telephone Encounter (Signed)
Confirmed and screened for 12-03-19 ov.

## 2019-12-03 ENCOUNTER — Encounter: Payer: Self-pay | Admitting: Nurse Practitioner

## 2019-12-03 ENCOUNTER — Ambulatory Visit (INDEPENDENT_AMBULATORY_CARE_PROVIDER_SITE_OTHER): Payer: Medicare Other | Admitting: Nurse Practitioner

## 2019-12-03 ENCOUNTER — Other Ambulatory Visit: Payer: Self-pay

## 2019-12-03 VITALS — BP 148/84 | HR 91 | Temp 97.5°F | Resp 16 | Ht 65.0 in | Wt 164.4 lb

## 2019-12-03 DIAGNOSIS — R319 Hematuria, unspecified: Secondary | ICD-10-CM | POA: Diagnosis not present

## 2019-12-03 DIAGNOSIS — N390 Urinary tract infection, site not specified: Secondary | ICD-10-CM | POA: Diagnosis not present

## 2019-12-03 DIAGNOSIS — I10 Essential (primary) hypertension: Secondary | ICD-10-CM

## 2019-12-03 DIAGNOSIS — R300 Dysuria: Secondary | ICD-10-CM

## 2019-12-03 LAB — POCT URINALYSIS DIPSTICK
Bilirubin, UA: NEGATIVE
Glucose, UA: NEGATIVE
Ketones, UA: NEGATIVE
Leukocytes, UA: NEGATIVE
Nitrite, UA: NEGATIVE
Protein, UA: POSITIVE — AB
Spec Grav, UA: 1.02 (ref 1.010–1.025)
Urobilinogen, UA: 0.2 E.U./dL
pH, UA: 5 (ref 5.0–8.0)

## 2019-12-03 MED ORDER — NITROFURANTOIN MONOHYD MACRO 100 MG PO CAPS: 100.0000 mg | ORAL_CAPSULE | Freq: Two times a day (BID) | ORAL | 0 refills | Status: DC

## 2019-12-03 NOTE — Progress Notes (Signed)
[facility] [place], [place] [address]  Internal MEDICINE  Office Visit Note  Patient Name: Angie [name]  [id number]  [id number]  Date of Service: 12/14/2019  Chief Complaint  Patient presents with  . Follow-up    weight management   . Hypertension  . Arthritis  . Dysuria    feels like bladder does not hold as it used to    The patient is here for routine follow up. She had been taking phentermine to help with weight management. She recently stopped taking it. Has been about two weeks since she took it last. Has lost additional two pounds since her last visit. She is walking more often and eating well. Blood pressure, though elevated today, has been doing very well at home. She monitors her blood pressure daily at home. Runs between [id number] and [id number] systolic, and 60 to 80 diastolic. She feels well.  She states that she has been having some frequency and urgency of urination. This has been going on for the past few weeks. Has become more noticeable recently. There is some protein and small amount of blood in the urine.       Current Medication: Outpatient Encounter Medications as of 12/03/2019  Medication Sig  . Biotin 1 MG CAPS Take 1,000 mcg by mouth daily.  . cyclobenzaprine (FLEXERIL) 10 MG tablet Take 10 mg by mouth as needed for muscle spasms.  . ergocalciferol (DRISDOL) 1.25 MG (50000 UT) capsule Take 1 capsule (50,000 Units total) by mouth once a week.  . hydrocortisone (ANUSOL-HC) 25 MG suppository Place 1 suppository (25 mg total) rectally 2 (two) times daily.  . hydrocortisone (PROCTOSOL HC) 2.5 % rectal cream Place 1 application rectally 2 (two) times daily.  Marland Kitchen lisinopril-hydrochlorothiazide (ZESTORETIC) 10-12.5 MG tablet Take 1 tab po daily  . pantoprazole (PROTONIX) 40 MG tablet Take 1 tablet (40 mg total) by mouth daily.  . phentermine (ADIPEX-P) 37.5 MG tablet Take 1 tablet (37.5 mg total) by mouth daily before breakfast.  . traMADol  (ULTRAM) 50 MG tablet Take 2 tablets (100 mg total) by mouth 2 (two) times daily.  . nitrofurantoin, macrocrystal-monohydrate, (MACROBID) 100 MG capsule Take 1 capsule (100 mg total) by mouth 2 (two) times daily.   No facility-administered encounter medications on file as of 12/03/2019.    Surgical History: Past Surgical History:  Procedure Laterality Date  . COLONOSCOPY WITH PROPOFOL N/A 10/04/2016   Procedure: COLONOSCOPY WITH PROPOFOL;  Surgeon: Lollie Sails, MD;  Location: [facility] ENDOSCOPY;  Service: Endoscopy;  Laterality: N/A;  . ESOPHAGOGASTRODUODENOSCOPY (EGD) WITH PROPOFOL N/A 05/13/2016   Procedure: ESOPHAGOGASTRODUODENOSCOPY (EGD) WITH PROPOFOL;  Surgeon: Lollie Sails, MD;  Location: [facility] ENDOSCOPY;  Service: Endoscopy;  Laterality: N/A;  . ESOPHAGOGASTRODUODENOSCOPY (EGD) WITH PROPOFOL N/A 10/19/2018   Procedure: ESOPHAGOGASTRODUODENOSCOPY (EGD) WITH PROPOFOL;  Surgeon: Lollie Sails, MD;  Location: [facility] ENDOSCOPY;  Service: Endoscopy;  Laterality: N/A;  . JOINT REPLACEMENT Left 2015  . TONSILLECTOMY    . TONSILLECTOMY    . TOTAL KNEE ARTHROPLASTY      Medical History: Past Medical History:  Diagnosis Date  . Arthritis   . Esophagitis, reflux   . Gastritis   . Hepatitis C 2014   treated with Harvoni  . History of colon polyps   . Hypertension   . Kidney stone   . Melanoma ([place])   . Osteoarthritis   . Sepsis due to urinary tract infection ([place]) 2014  . Thrombocytopenia, primary ([place])    Dr  Finnegan    Family History: Family History  Problem Relation Age of Onset  . Liver cancer Father   . Cancer Father   . COPD Father   . Heart disease Father   . COPD Mother   . Hypertension Mother   . Diabetes Sister   . Diabetes Paternal Grandmother   . Heart disease Paternal Grandmother   . Hypertension Paternal Grandmother   . Diabetes Paternal Grandfather   . Heart disease Paternal Grandfather   . Hypertension Paternal Grandfather   . Breast cancer Neg  Hx     Social History   Socioeconomic History  . Marital status: Single    Spouse name: Not on file  . Number of children: Not on file  . Years of education: Not on file  . Highest education level: Not on file  Occupational History  . Not on file  Tobacco Use  . Smoking status: Former Smoker    Types: Cigarettes  . Smokeless tobacco: Never Used  Substance and Sexual Activity  . Alcohol use: Yes    Alcohol/week: 1.0 standard drinks    Types: 1 Glasses of wine per week    Comment: occasionally   . Drug use: No  . Sexual activity: Yes    Birth control/protection: None, Post-menopausal  Other Topics Concern  . Not on file  Social History Narrative  . Not on file   Social Determinants of Health   Financial Resource Strain:   . Difficulty of Paying Living Expenses:   Food Insecurity:   . Worried About Charity fundraiser in the Last Year:   . Arboriculturist in the Last Year:   Transportation Needs:   . Film/video editor (Medical):   Marland Kitchen Lack of Transportation (Non-Medical):   Physical Activity:   . Days of Exercise per Week:   . Minutes of Exercise per Session:   Stress:   . Feeling of Stress :   Social Connections:   . Frequency of Communication with Friends and Family:   . Frequency of Social Gatherings with Friends and Family:   . Attends Religious Services:   . Active Member of Clubs or Organizations:   . Attends Archivist Meetings:   Marland Kitchen Marital Status:   Intimate Partner Violence:   . Fear of Current or Ex-Partner:   . Emotionally Abused:   Marland Kitchen Physically Abused:   . Sexually Abused:       Review of Systems  Constitutional: Negative for chills, fatigue and unexpected weight change.       Weight loss two pounds since her last visit   HENT: Negative for congestion, postnasal drip, rhinorrhea, sneezing and sore throat.   Respiratory: Negative for cough, chest tightness and shortness of breath.   Cardiovascular: Negative for chest pain and  palpitations.  Gastrointestinal: Negative for abdominal pain, constipation, diarrhea, nausea and vomiting.  Endocrine: Negative for cold intolerance, heat intolerance, polydipsia and polyuria.  Genitourinary: Positive for dysuria and frequency.  Musculoskeletal: Positive for arthralgias and myalgias. Negative for back pain, joint swelling and neck pain.       Right shoulder tenderness. Hurts more with extended range of motion, however, ROM not limited. No injury or trauma noted.   Skin: Negative for rash.  Allergic/Immunologic: Negative for environmental allergies.  Neurological: Negative for dizziness, tremors, numbness and headaches.  Hematological: Negative for adenopathy. Does not bruise/bleed easily.  Psychiatric/Behavioral: Negative for behavioral problems (Depression), sleep disturbance and suicidal ideas. The patient is not nervous/anxious.  Today's Vitals   12/03/19 1418  BP: (!) 148/84  Pulse: 91  Resp: 16  Temp: (!) 97.5 F (36.4 C)  SpO2: 95%  Weight: 164 lb 6.4 oz (74.6 kg)  Height: 5\' 5"  (1.651 m)   Body mass index is 27.36 kg/m.  Physical Exam Vitals and nursing note reviewed.  Constitutional:      General: She is not in acute distress.    Appearance: Normal appearance. She is well-developed. She is not diaphoretic.  HENT:     Head: Normocephalic and atraumatic.     Nose: Nose normal.     Mouth/Throat:     Pharynx: No oropharyngeal exudate.  Eyes:     Pupils: Pupils are equal, round, and reactive to light.  Neck:     Thyroid: No thyromegaly.     Vascular: No carotid bruit or JVD.     Trachea: No tracheal deviation.  Cardiovascular:     Rate and Rhythm: Normal rate and regular rhythm.     Heart sounds: Normal heart sounds. No murmur. No friction rub. No gallop.   Pulmonary:     Effort: Pulmonary effort is normal. No respiratory distress.     Breath sounds: Normal breath sounds. No wheezing or rales.  Chest:     Chest wall: No tenderness.  Abdominal:      Palpations: Abdomen is soft.  Genitourinary:    Comments: Urine sample positive for protein and small amount of blood.  Musculoskeletal:        General: Normal range of motion.     Cervical back: Normal range of motion and neck supple.     Comments: Right shoulder tenderness. ROM and strength are currently intact. Chronic low back and bilateral hip pain. This is more severe when sitting for long periods of time.   Lymphadenopathy:     Cervical: No cervical adenopathy.  Skin:    General: Skin is warm and dry.  Neurological:     Mental Status: She is alert and oriented to person, place, and time.     Cranial Nerves: No cranial nerve deficit.  Psychiatric:        Behavior: Behavior normal.        Thought Content: Thought content normal.        Judgment: Judgment normal.   Assessment/Plan: 1. Urinary tract infection with hematuria, site unspecified Start macrobid 100mg  bid for 7 days. Send urine for culture and sensitivity and adjust antibiotics as indicated.  - nitrofurantoin, macrocrystal-monohydrate, (MACROBID) 100 MG capsule; Take 1 capsule (100 mg total) by mouth 2 (two) times daily.  Dispense: 14 capsule; Refill: 0  2. Dysuria Positive for protein and small blood. Treat with macrobid BID. - POCT Urinalysis Dipstick  3. Essential hypertension Stable. Continue bo medication as prescribed  General Counseling: annalaura kukla understanding of the findings of todays visit and agrees with plan of treatment. I have discussed any further diagnostic evaluation that may be needed or ordered today. We also reviewed her medications today. she has been encouraged to call the office with any questions or concerns that should arise related to todays visit.  Hypertension Counseling:   The following hypertensive lifestyle modification were recommended and discussed:  1. Limiting alcohol intake to less than 1 oz/day of ethanol:(24 oz of beer or 8 oz of wine or 2 oz of 100-proof  whiskey). 2. Take baby ASA 81 mg daily. 3. Importance of regular aerobic exercise and losing weight. 4. Reduce dietary saturated fat and cholesterol intake for overall  cardiovascular health. 5. Maintaining adequate dietary potassium, calcium, and magnesium intake. 6. Regular monitoring of the blood pressure. 7. Reduce sodium intake to less than 100 mmol/day (less than 2.3 gm of sodium or less than 6 gm of sodium choride)   This patient was seen by [place] with Dr Lavera Guise as a part of collaborative care agreement  Orders Placed This Encounter  Procedures  . POCT Urinalysis Dipstick    Meds ordered this encounter  Medications  . nitrofurantoin, macrocrystal-monohydrate, (MACROBID) 100 MG capsule    Sig: Take 1 capsule (100 mg total) by mouth 2 (two) times daily.    Dispense:  14 capsule    Refill:  0    Order Specific Question:   Supervising Provider    Answer:   Lavera Guise [id number]    Total time spent: 30 Minutes   Time spent includes review of chart, medications, test results, and follow up plan with the patient.      Dr Lavera Guise Internal medicine

## 2019-12-14 DIAGNOSIS — N390 Urinary tract infection, site not specified: Secondary | ICD-10-CM | POA: Insufficient documentation

## 2019-12-14 DIAGNOSIS — R319 Hematuria, unspecified: Secondary | ICD-10-CM | POA: Insufficient documentation

## 2020-01-10 ENCOUNTER — Telehealth: Payer: Self-pay

## 2020-01-10 NOTE — Telephone Encounter (Signed)
Confirmed and screened for 01-14-20 ov. 

## 2020-01-14 ENCOUNTER — Ambulatory Visit: Payer: Medicare Other | Admitting: Nurse Practitioner

## 2020-01-14 ENCOUNTER — Telehealth: Payer: Self-pay

## 2020-01-14 NOTE — Telephone Encounter (Signed)
Patient rescheduled appointment on 01/14/2020 to 01/27/2020. klh

## 2020-01-23 ENCOUNTER — Telehealth: Payer: Self-pay

## 2020-01-23 NOTE — Telephone Encounter (Signed)
Confirmed and screened for 01-27-20 ov. 

## 2020-01-27 ENCOUNTER — Encounter: Payer: Self-pay | Admitting: Nurse Practitioner

## 2020-01-27 ENCOUNTER — Other Ambulatory Visit: Payer: Self-pay

## 2020-01-27 ENCOUNTER — Ambulatory Visit (INDEPENDENT_AMBULATORY_CARE_PROVIDER_SITE_OTHER): Payer: Medicare Other | Admitting: Nurse Practitioner

## 2020-01-27 VITALS — BP 148/88 | HR 105 | Temp 97.6°F | Resp 16 | Ht 65.0 in | Wt 164.8 lb

## 2020-01-27 DIAGNOSIS — E663 Overweight: Secondary | ICD-10-CM

## 2020-01-27 DIAGNOSIS — Z1231 Encounter for screening mammogram for malignant neoplasm of breast: Secondary | ICD-10-CM | POA: Diagnosis not present

## 2020-01-27 DIAGNOSIS — M5442 Lumbago with sciatica, left side: Secondary | ICD-10-CM

## 2020-01-27 DIAGNOSIS — I10 Essential (primary) hypertension: Secondary | ICD-10-CM

## 2020-01-27 DIAGNOSIS — G8929 Other chronic pain: Secondary | ICD-10-CM

## 2020-01-27 DIAGNOSIS — K219 Gastro-esophageal reflux disease without esophagitis: Secondary | ICD-10-CM

## 2020-01-27 DIAGNOSIS — M5441 Lumbago with sciatica, right side: Secondary | ICD-10-CM

## 2020-01-27 MED ORDER — LISINOPRIL-HYDROCHLOROTHIAZIDE 10-12.5 MG PO TABS: ORAL_TABLET | ORAL | 5 refills | Status: DC

## 2020-01-27 MED ORDER — TRAMADOL HCL 50 MG PO TABS: 100.0000 mg | ORAL_TABLET | Freq: Two times a day (BID) | ORAL | 2 refills | Status: DC

## 2020-01-27 MED ORDER — PANTOPRAZOLE SODIUM 40 MG PO TBEC: 40.0000 mg | DELAYED_RELEASE_TABLET | Freq: Every day | ORAL | 5 refills | Status: DC

## 2020-01-27 MED ORDER — PHENTERMINE HCL 37.5 MG PO TABS: 37.5000 mg | ORAL_TABLET | Freq: Every day | ORAL | 0 refills | Status: DC

## 2020-01-27 NOTE — Progress Notes (Signed)
[facility] [place], [place] [address]  Internal MEDICINE  Office Visit Note  Patient Name: Angie [name]  [id number]  [id number]  Date of Service: 02/02/2020  Chief Complaint  Patient presents with   Follow-up   Hypertension    The patient presents for routine follow up of weight management and blood pressure. She has been taking phentermine to help with weight management. She has not gained or lost any weight since her most recent visit.  She is walking more often and eating well. Blood pressure, though elevated today, has been doing very well at home. She monitors her blood pressure daily at home. Runs between 149 and 702 systolic, and 60 to 80 diastolic. She feels well. She does need to have refills for routine medication today. She is due to have screening mammogram.        Current Medication: Outpatient Encounter Medications as of 01/27/2020  Medication Sig   Biotin 1 MG CAPS Take 1,000 mcg by mouth daily.   cyclobenzaprine (FLEXERIL) 10 MG tablet Take 10 mg by mouth as needed for muscle spasms.   ergocalciferol (DRISDOL) 1.25 MG (50000 UT) capsule Take 1 capsule (50,000 Units total) by mouth once a week.   hydrocortisone (ANUSOL-HC) 25 MG suppository Place 1 suppository (25 mg total) rectally 2 (two) times daily.   hydrocortisone (PROCTOSOL HC) 2.5 % rectal cream Place 1 application rectally 2 (two) times daily.   lisinopril-hydrochlorothiazide (ZESTORETIC) 10-12.5 MG tablet Take 1 tab po daily   nitrofurantoin, macrocrystal-monohydrate, (MACROBID) 100 MG capsule Take 1 capsule (100 mg total) by mouth 2 (two) times daily.   pantoprazole (PROTONIX) 40 MG tablet Take 1 tablet (40 mg total) by mouth daily.   phentermine (ADIPEX-P) 37.5 MG tablet Take 1 tablet (37.5 mg total) by mouth daily before breakfast.   traMADol (ULTRAM) 50 MG tablet Take 2 tablets (100 mg total) by mouth 2 (two) times daily.   [DISCONTINUED]  lisinopril-hydrochlorothiazide (ZESTORETIC) 10-12.5 MG tablet Take 1 tab po daily   [DISCONTINUED] pantoprazole (PROTONIX) 40 MG tablet Take 1 tablet (40 mg total) by mouth daily.   [DISCONTINUED] phentermine (ADIPEX-P) 37.5 MG tablet Take 1 tablet (37.5 mg total) by mouth daily before breakfast.   [DISCONTINUED] traMADol (ULTRAM) 50 MG tablet Take 2 tablets (100 mg total) by mouth 2 (two) times daily.   No facility-administered encounter medications on file as of 01/27/2020.    Surgical History: Past Surgical History:  Procedure Laterality Date   COLONOSCOPY WITH PROPOFOL N/A 10/04/2016   Procedure: COLONOSCOPY WITH PROPOFOL;  Surgeon: Lollie Sails, MD;  Location: [facility] ENDOSCOPY;  Service: Endoscopy;  Laterality: N/A;   ESOPHAGOGASTRODUODENOSCOPY (EGD) WITH PROPOFOL N/A 05/13/2016   Procedure: ESOPHAGOGASTRODUODENOSCOPY (EGD) WITH PROPOFOL;  Surgeon: Lollie Sails, MD;  Location: [facility] ENDOSCOPY;  Service: Endoscopy;  Laterality: N/A;   ESOPHAGOGASTRODUODENOSCOPY (EGD) WITH PROPOFOL N/A 10/19/2018   Procedure: ESOPHAGOGASTRODUODENOSCOPY (EGD) WITH PROPOFOL;  Surgeon: Lollie Sails, MD;  Location: [facility] ENDOSCOPY;  Service: Endoscopy;  Laterality: N/A;   JOINT REPLACEMENT Left 2015   TONSILLECTOMY     TONSILLECTOMY     TOTAL KNEE ARTHROPLASTY      Medical History: Past Medical History:  Diagnosis Date   Arthritis    Esophagitis, reflux    Gastritis    Hepatitis C 2014   treated with Harvoni   History of colon polyps    Hypertension    Kidney stone    Melanoma ([place])    Osteoarthritis    Sepsis  due to urinary tract infection ([place]) 2014   Thrombocytopenia, primary ([place])    Dr Grayland Ormond    Family History: Family History  Problem Relation Age of Onset   Liver cancer Father    Cancer Father    COPD Father    Heart disease Father    COPD Mother    Hypertension Mother    Diabetes Sister    Diabetes Paternal Grandmother    Heart disease  Paternal Grandmother    Hypertension Paternal Grandmother    Diabetes Paternal Grandfather    Heart disease Paternal Grandfather    Hypertension Paternal Grandfather    Breast cancer Neg Hx     Social History   Socioeconomic History   Marital status: Single    Spouse name: Not on file   Number of children: Not on file   Years of education: Not on file   Highest education level: Not on file  Occupational History   Not on file  Tobacco Use   Smoking status: Former Smoker    Types: Cigarettes   Smokeless tobacco: Never Used  Scientific laboratory technician Use: Never used  Substance and Sexual Activity   Alcohol use: Yes    Alcohol/week: 1.0 standard drink    Types: 1 Glasses of wine per week    Comment: occasionally    Drug use: No   Sexual activity: Yes    Birth control/protection: None, Post-menopausal  Other Topics Concern   Not on file  Social History Narrative   Not on file   Social Determinants of Health   Financial Resource Strain:    Difficulty of Paying Living Expenses:   Food Insecurity:    Worried About Charity fundraiser in the Last Year:    Arboriculturist in the Last Year:   Transportation Needs:    Film/video editor (Medical):    Lack of Transportation (Non-Medical):   Physical Activity:    Days of Exercise per Week:    Minutes of Exercise per Session:   Stress:    Feeling of Stress :   Social Connections:    Frequency of Communication with Friends and Family:    Frequency of Social Gatherings with Friends and Family:    Attends Religious Services:    Active Member of Clubs or Organizations:    Attends Music therapist:    Marital Status:   Intimate Partner Violence:    Fear of Current or Ex-Partner:    Emotionally Abused:    Physically Abused:    Sexually Abused:       Review of Systems  Constitutional: Negative for chills, fatigue and unexpected weight change.       No weight gain or weight  loss since her last visit.   HENT: Negative for congestion, postnasal drip, rhinorrhea, sneezing and sore throat.   Respiratory: Negative for cough, chest tightness and shortness of breath.   Cardiovascular: Negative for chest pain and palpitations.  Gastrointestinal: Negative for abdominal pain, constipation, diarrhea, nausea and vomiting.  Endocrine: Negative for cold intolerance, heat intolerance, polydipsia and polyuria.  Genitourinary: Positive for dysuria and frequency.  Musculoskeletal: Positive for arthralgias and myalgias. Negative for back pain, joint swelling and neck pain.       Right shoulder tenderness. Hurts more with extended range of motion, however, ROM not limited. No injury or trauma noted.   Skin: Negative for rash.  Allergic/Immunologic: Negative for environmental allergies.  Neurological: Negative for dizziness, tremors, numbness and  headaches.  Hematological: Negative for adenopathy. Does not bruise/bleed easily.  Psychiatric/Behavioral: Negative for behavioral problems (Depression), sleep disturbance and suicidal ideas. The patient is not nervous/anxious.     Today's Vitals   01/27/20 1342  BP: (!) 148/88  Pulse: (!) 105  Resp: 16  Temp: 97.6 F (36.4 C)  SpO2: 96%  Weight: 164 lb 12.8 oz (74.8 kg)  Height: 5\' 5"  (1.651 m)   Body mass index is 27.42 kg/m.  Physical Exam Vitals and nursing note reviewed.  Constitutional:      General: She is not in acute distress.    Appearance: Normal appearance. She is well-developed. She is not diaphoretic.  HENT:     Head: Normocephalic and atraumatic.     Nose: Nose normal.     Mouth/Throat:     Pharynx: No oropharyngeal exudate.  Eyes:     Pupils: Pupils are equal, round, and reactive to light.  Neck:     Thyroid: No thyromegaly.     Vascular: No carotid bruit or JVD.     Trachea: No tracheal deviation.  Cardiovascular:     Rate and Rhythm: Normal rate and regular rhythm.     Heart sounds: Normal heart  sounds. No murmur heard.  No friction rub. No gallop.   Pulmonary:     Effort: Pulmonary effort is normal. No respiratory distress.     Breath sounds: Normal breath sounds. No wheezing or rales.  Chest:     Chest wall: No tenderness.  Abdominal:     Palpations: Abdomen is soft.  Musculoskeletal:        General: Normal range of motion.     Cervical back: Normal range of motion and neck supple.     Comments: Right shoulder tenderness. ROM and strength are currently intact. Chronic low back and bilateral hip pain. This is more severe when sitting for long periods of time.   Lymphadenopathy:     Cervical: No cervical adenopathy.  Skin:    General: Skin is warm and dry.  Neurological:     Mental Status: She is alert and oriented to person, place, and time.     Cranial Nerves: No cranial nerve deficit.  Psychiatric:        Behavior: Behavior normal.        Thought Content: Thought content normal.        Judgment: Judgment normal.    Assessment/Plan:  1. Essential hypertension Generally well managed. Patient consistently monitoring blood pressure at home and is WNL. Continue bp medication as prescribed  - lisinopril-hydrochlorothiazide (ZESTORETIC) 10-12.5 MG tablet; Take 1 tab po daily  Dispense: 30 tablet; Refill: 5  2. Gastroesophageal reflux disease without esophagitis Continue pantoprazole as prescribed. Refills provided today. - pantoprazole (PROTONIX) 40 MG tablet; Take 1 tablet (40 mg total) by mouth daily.  Dispense: 30 tablet; Refill: 5  3. Chronic low back pain with bilateral sciatica, unspecified back pain laterality May continue to take tramadol 100mg  twice daily as needed for pain control. New prescription sent to her pharmacy today  - traMADol (ULTRAM) 50 MG tablet; Take 2 tablets (100 mg total) by mouth 2 (two) times daily.  Dispense: 120 tablet; Refill: 2  4. Overweight May continue phentermine 37.5mg  tablets daily to suppress appetite. Limit calorie intake to 1200  calories per da ys ne incorporate exercise into daily routine.  - phentermine (ADIPEX-P) 37.5 MG tablet; Take 1 tablet (37.5 mg total) by mouth daily before breakfast.  Dispense: 30 tablet; Refill: 0  5. Encounter for screening mammogram for malignant neoplasm of breast - MM DIGITAL SCREENING BILATERAL; Future  General Counseling: fidencia [email or web address] understanding of the findings of todays visit and agrees with plan of treatment. I have discussed any further diagnostic evaluation that may be needed or ordered today. We also reviewed her medications today. she has been encouraged to call the office with any questions or concerns that should arise related to todays visit.  Hypertension Counseling:   The following hypertensive lifestyle modification were recommended and discussed:  1. Limiting alcohol intake to less than 1 oz/day of ethanol:(24 oz of beer or 8 oz of wine or 2 oz of 100-proof whiskey). 2. Take baby ASA 81 mg daily. 3. Importance of regular aerobic exercise and losing weight. 4. Reduce dietary saturated fat and cholesterol intake for overall cardiovascular health. 5. Maintaining adequate dietary potassium, calcium, and magnesium intake. 6. Regular monitoring of the blood pressure. 7. Reduce sodium intake to less than 100 mmol/day (less than 2.3 gm of sodium or less than 6 gm of sodium choride)   This patient was seen by [place] with Dr Lavera Guise as a part of collaborative care agreement  Orders Placed This Encounter  Procedures   MM [place] ordered this encounter  Medications   lisinopril-hydrochlorothiazide (ZESTORETIC) 10-12.5 MG tablet    Sig: Take 1 tab po daily    Dispense:  30 tablet    Refill:  5    Order Specific Question:   Supervising Provider    Answer:   Lavera Guise [[id number]]   pantoprazole (PROTONIX) 40 MG tablet    Sig: Take 1 tablet (40 mg total) by mouth daily.    Dispense:  30 tablet    Refill:  5     Order Specific Question:   Supervising Provider    Answer:   Lavera Guise [[id number]]   traMADol (ULTRAM) 50 MG tablet    Sig: Take 2 tablets (100 mg total) by mouth 2 (two) times daily.    Dispense:  120 tablet    Refill:  2    Order Specific Question:   Supervising Provider    Answer:   Lavera Guise [[id number]]   phentermine (ADIPEX-P) 37.5 MG tablet    Sig: Take 1 tablet (37.5 mg total) by mouth daily before breakfast.    Dispense:  30 tablet    Refill:  0    Order Specific Question:   Supervising Provider    Answer:   Lavera Guise [[id number]]    Total time spent: 30 Minutes   Time spent includes review of chart, medications, test results, and follow up plan with the patient.      Dr Lavera Guise Internal medicine

## 2020-02-02 DIAGNOSIS — Z1231 Encounter for screening mammogram for malignant neoplasm of breast: Secondary | ICD-10-CM | POA: Insufficient documentation

## 2020-02-25 ENCOUNTER — Telehealth: Payer: Self-pay

## 2020-02-25 NOTE — Telephone Encounter (Signed)
m full unable to confirm and screen for 02-27-20 ov.

## 2020-02-27 ENCOUNTER — Ambulatory Visit: Payer: Medicare Other | Admitting: Nurse Practitioner

## 2020-02-27 DIAGNOSIS — H2513 Age-related nuclear cataract, bilateral: Secondary | ICD-10-CM | POA: Diagnosis not present

## 2020-03-05 ENCOUNTER — Ambulatory Visit: Payer: Medicare Other | Admitting: Nurse Practitioner

## 2020-03-18 ENCOUNTER — Other Ambulatory Visit: Payer: Self-pay

## 2020-03-18 DIAGNOSIS — I10 Essential (primary) hypertension: Secondary | ICD-10-CM

## 2020-03-18 MED ORDER — LISINOPRIL-HYDROCHLOROTHIAZIDE 10-12.5 MG PO TABS: ORAL_TABLET | ORAL | 5 refills | Status: DC

## 2020-04-07 ENCOUNTER — Other Ambulatory Visit: Payer: Self-pay

## 2020-04-07 DIAGNOSIS — E559 Vitamin D deficiency, unspecified: Secondary | ICD-10-CM

## 2020-04-07 MED ORDER — ERGOCALCIFEROL 1.25 MG (50000 UT) PO CAPS: 50000.0000 [IU] | ORAL_CAPSULE | ORAL | 5 refills | Status: DC

## 2020-05-26 ENCOUNTER — Other Ambulatory Visit (HOSPITAL_COMMUNITY): Payer: Self-pay | Admitting: Gastroenterology

## 2020-05-26 ENCOUNTER — Other Ambulatory Visit: Payer: Self-pay | Admitting: Gastroenterology

## 2020-05-26 DIAGNOSIS — K7460 Unspecified cirrhosis of liver: Secondary | ICD-10-CM

## 2020-06-03 ENCOUNTER — Ambulatory Visit
Admission: RE | Admit: 2020-06-03 | Discharge: 2020-06-03 | Disposition: A | Discharge status: Home / Self Care | Payer: Medicare Other | Source: Ambulatory Visit | Attending: Gastroenterology | Admitting: Gastroenterology

## 2020-06-03 ENCOUNTER — Other Ambulatory Visit: Payer: Self-pay

## 2020-06-03 DIAGNOSIS — K7689 Other specified diseases of liver: Secondary | ICD-10-CM | POA: Diagnosis not present

## 2020-06-03 DIAGNOSIS — K7460 Unspecified cirrhosis of liver: Secondary | ICD-10-CM

## 2020-06-03 DIAGNOSIS — H43812 Vitreous degeneration, left eye: Secondary | ICD-10-CM | POA: Diagnosis not present

## 2020-06-11 ENCOUNTER — Other Ambulatory Visit: Payer: Self-pay

## 2020-06-11 DIAGNOSIS — I10 Essential (primary) hypertension: Secondary | ICD-10-CM

## 2020-06-11 DIAGNOSIS — K219 Gastro-esophageal reflux disease without esophagitis: Secondary | ICD-10-CM

## 2020-06-11 MED ORDER — LISINOPRIL-HYDROCHLOROTHIAZIDE 10-12.5 MG PO TABS: ORAL_TABLET | ORAL | 5 refills | Status: DC

## 2020-06-11 MED ORDER — PANTOPRAZOLE SODIUM 40 MG PO TBEC: 40.0000 mg | DELAYED_RELEASE_TABLET | Freq: Every day | ORAL | 5 refills | Status: DC

## 2020-06-25 ENCOUNTER — Ambulatory Visit (INDEPENDENT_AMBULATORY_CARE_PROVIDER_SITE_OTHER): Payer: Medicare Other | Admitting: Nurse Practitioner

## 2020-06-25 ENCOUNTER — Encounter: Payer: Self-pay | Admitting: Nurse Practitioner

## 2020-06-25 ENCOUNTER — Other Ambulatory Visit
Admission: RE | Admit: 2020-06-25 | Discharge: 2020-06-25 | Disposition: A | Discharge status: Home / Self Care | Payer: Medicare Other | Attending: Nurse Practitioner | Admitting: Nurse Practitioner

## 2020-06-25 ENCOUNTER — Other Ambulatory Visit: Payer: Self-pay

## 2020-06-25 VITALS — BP 147/87 | HR 101 | Temp 97.7°F | Resp 16 | Ht 65.0 in | Wt 161.8 lb

## 2020-06-25 DIAGNOSIS — L659 Nonscarring hair loss, unspecified: Secondary | ICD-10-CM

## 2020-06-25 DIAGNOSIS — E010 Iodine-deficiency related diffuse (endemic) goiter: Secondary | ICD-10-CM | POA: Diagnosis not present

## 2020-06-25 DIAGNOSIS — I10 Essential (primary) hypertension: Secondary | ICD-10-CM

## 2020-06-25 DIAGNOSIS — E042 Nontoxic multinodular goiter: Secondary | ICD-10-CM | POA: Diagnosis not present

## 2020-06-25 LAB — T4, FREE: Free T4: 0.67 ng/dL (ref 0.61–1.12)

## 2020-06-25 LAB — TSH: TSH: 1.729 u[IU]/mL (ref 0.350–4.500)

## 2020-06-25 NOTE — Progress Notes (Signed)
[facility] [place], [place] [address]  Internal MEDICINE  Office Visit Note  Patient Name: Angie [name]  [id number]  [id number]  Date of Service: 07/18/2020  Chief Complaint  Patient presents with   Follow-up    needs a thyroid test   Hypertension   Quality Metric Gaps    flu,tetnaus    The patient is here for routine follow up. She states that she has noted increased hair loss. States that hair loss so significant that her hair dresser called attention to it. Told her she may need to have thyroid panel checked. Patient denies increased stress levels. Just got married 04/20/2020. She had thyroid ultrasound done in 2019. She does have multiple nodules on both lobes of the thyroid, all measuring under 30mm in diameter. No follow up ultrasound has been obtained.       Current Medication: Outpatient Encounter Medications as of 06/25/2020  Medication Sig Note   Biotin 1 MG CAPS Take 1,000 mcg by mouth daily.    cyclobenzaprine (FLEXERIL) 10 MG tablet Take 10 mg by mouth as needed for muscle spasms.    ergocalciferol (DRISDOL) 1.25 MG (50000 UT) capsule Take 1 capsule (50,000 Units total) by mouth once a week.    hydrocortisone (ANUSOL-HC) 25 MG suppository Place 1 suppository (25 mg total) rectally 2 (two) times daily.    hydrocortisone (PROCTOSOL HC) 2.5 % rectal cream Place 1 application rectally 2 (two) times daily.    lisinopril-hydrochlorothiazide (ZESTORETIC) 10-12.5 MG tablet Take 1 tab po daily    pantoprazole (PROTONIX) 40 MG tablet Take 1 tablet (40 mg total) by mouth daily.    traMADol (ULTRAM) 50 MG tablet Take 2 tablets (100 mg total) by mouth 2 (two) times daily.    [DISCONTINUED] nitrofurantoin, macrocrystal-monohydrate, (MACROBID) 100 MG capsule Take 1 capsule (100 mg total) by mouth 2 (two) times daily.    [DISCONTINUED] phentermine (ADIPEX-P) 37.5 MG tablet Take 1 tablet (37.5 mg total) by mouth daily before breakfast.  06/25/2020: patient has been off medication since 02/2020   No facility-administered encounter medications on file as of 06/25/2020.    Surgical History: Past Surgical History:  Procedure Laterality Date   COLONOSCOPY WITH PROPOFOL N/A 10/04/2016   Procedure: COLONOSCOPY WITH PROPOFOL;  Surgeon: Lollie Sails, MD;  Location: [facility] ENDOSCOPY;  Service: Endoscopy;  Laterality: N/A;   ESOPHAGOGASTRODUODENOSCOPY (EGD) WITH PROPOFOL N/A 05/13/2016   Procedure: ESOPHAGOGASTRODUODENOSCOPY (EGD) WITH PROPOFOL;  Surgeon: Lollie Sails, MD;  Location: [facility] ENDOSCOPY;  Service: Endoscopy;  Laterality: N/A;   ESOPHAGOGASTRODUODENOSCOPY (EGD) WITH PROPOFOL N/A 10/19/2018   Procedure: ESOPHAGOGASTRODUODENOSCOPY (EGD) WITH PROPOFOL;  Surgeon: Lollie Sails, MD;  Location: [facility] ENDOSCOPY;  Service: Endoscopy;  Laterality: N/A;   JOINT REPLACEMENT Left 2015   TONSILLECTOMY     TONSILLECTOMY     TOTAL KNEE ARTHROPLASTY      Medical History: Past Medical History:  Diagnosis Date   Arthritis    Esophagitis, reflux    Gastritis    Hepatitis C 2014   treated with Harvoni   History of colon polyps    Hypertension    Kidney stone    Melanoma ([place])    Osteoarthritis    Sepsis due to urinary tract infection ([place]) 2014   Thrombocytopenia, primary ([place])    Dr Grayland Ormond    Family History: Family History  Problem Relation Age of Onset   Liver cancer Father    Cancer Father    COPD Father    Heart  disease Father    COPD Mother    Hypertension Mother    Diabetes Sister    Diabetes Paternal Grandmother    Heart disease Paternal Grandmother    Hypertension Paternal Grandmother    Diabetes Paternal Grandfather    Heart disease Paternal Grandfather    Hypertension Paternal Grandfather    Breast cancer Neg Hx     Social History   Socioeconomic History   Marital status: Married    Spouse name: Not on file   Number of children: Not on file   Years of  education: Not on file   Highest education level: Not on file  Occupational History   Not on file  Tobacco Use   Smoking status: Former Smoker    Types: Cigarettes   Smokeless tobacco: Never Used  Scientific laboratory technician Use: Never used  Substance and Sexual Activity   Alcohol use: Yes    Alcohol/week: 1.0 standard drink    Types: 1 Glasses of wine per week    Comment: occasionally    Drug use: No   Sexual activity: Yes    Birth control/protection: None, Post-menopausal  Other Topics Concern   Not on file  Social History Narrative   Not on file   Social Determinants of Health   Financial Resource Strain:    Difficulty of Paying Living Expenses: Not on file  Food Insecurity:    Worried About Charity fundraiser in the Last Year: Not on file   [employer] of Food in the Last Year: Not on file  Transportation Needs:    Lack of Transportation (Medical): Not on file   Lack of Transportation (Non-Medical): Not on file  Physical Activity:    Days of Exercise per Week: Not on file   Minutes of Exercise per Session: Not on file  Stress:    Feeling of Stress : Not on file  Social Connections:    Frequency of Communication with Friends and Family: Not on file   Frequency of Social Gatherings with Friends and Family: Not on file   Attends Religious Services: Not on file   Active Member of Clubs or Organizations: Not on file   Attends Archivist Meetings: Not on file   Marital Status: Not on file  Intimate Partner Violence:    Fear of Current or Ex-Partner: Not on file   Emotionally Abused: Not on file   Physically Abused: Not on file   Sexually Abused: Not on file      Review of Systems  Constitutional: Positive for fatigue. Negative for activity change, chills and unexpected weight change.  HENT: Negative for congestion, postnasal drip, rhinorrhea, sneezing and sore throat.   Respiratory: Negative for cough, chest tightness and shortness of  breath.   Cardiovascular: Negative for chest pain and palpitations.  Gastrointestinal: Negative for abdominal pain, constipation, diarrhea, nausea and vomiting.  Endocrine: Negative for cold intolerance, heat intolerance, polydipsia and polyuria.       Has noted unusual hair loss. history of multinodular thyroid.   Musculoskeletal: Positive for arthralgias and myalgias. Negative for back pain, joint swelling and neck pain.       Chronic hip and back pain.   Skin: Negative for rash.  Allergic/Immunologic: Negative for environmental allergies.  Neurological: Negative for dizziness, tremors, numbness and headaches.  Hematological: Negative for adenopathy. Does not bruise/bleed easily.  Psychiatric/Behavioral: Negative for behavioral problems (Depression), sleep disturbance and suicidal ideas. The patient is not nervous/anxious.  Today's Vitals   06/25/20 1458  BP: (!) 147/87  Pulse: (!) 101  Resp: 16  Temp: 97.7 F (36.5 C)  SpO2: 96%  Weight: 161 lb 12.8 oz (73.4 kg)  Height: 5\' 5"  (1.651 m)   Body mass index is 26.92 kg/m.  Physical Exam Vitals and nursing note reviewed.  Constitutional:      General: She is not in acute distress.    Appearance: Normal appearance. She is well-developed. She is not diaphoretic.  HENT:     Head: Normocephalic and atraumatic.     Nose: Nose normal.     Mouth/Throat:     Pharynx: No oropharyngeal exudate.  Eyes:     Pupils: Pupils are equal, round, and reactive to light.  Neck:     Thyroid: Thyromegaly present.     Vascular: No JVD.     Trachea: No tracheal deviation.  Cardiovascular:     Rate and Rhythm: Normal rate and regular rhythm.     Heart sounds: Normal heart sounds. No murmur heard.  No friction rub. No gallop.   Pulmonary:     Effort: Pulmonary effort is normal. No respiratory distress.     Breath sounds: Normal breath sounds. No wheezing or rales.  Chest:     Chest wall: No tenderness.  Abdominal:     Palpations: Abdomen  is soft.  Musculoskeletal:        General: Normal range of motion.     Cervical back: Normal range of motion and neck supple.  Lymphadenopathy:     Cervical: No cervical adenopathy.  Skin:    General: Skin is warm and dry.  Neurological:     General: No focal deficit present.     Mental Status: She is alert and oriented to person, place, and time.     Cranial Nerves: No cranial nerve deficit.  Psychiatric:        Behavior: Behavior normal.        Thought Content: Thought content normal.        Judgment: Judgment normal.    Assessment/Plan:  1. Multinodular goiter Will get ultrasound of thyroid for further evaluation. Refer to endocrinology/ - US THYROID; Future  2. Alopecia Check thyroid panel and other labs for further evaluation.   3. Essential hypertension Stable. Continue bp medication as prescribed    General Counseling: brita jurgensen understanding of the findings of todays visit and agrees with plan of treatment. I have discussed any further diagnostic evaluation that may be needed or ordered today. We also reviewed her medications today. she has been encouraged to call the office with any questions or concerns that should arise related to todays visit.    Orders Placed This Encounter  Procedures   US THYROID    This patient was seen by Leretha Pol FNP Collaboration with Dr Lavera Guise as a part of collaborative care agreement   Total time spent: 30 Minutes   Time spent includes review of chart, medications, test results, and follow up plan with the patient.      Dr Lavera Guise Internal medicine

## 2020-06-26 LAB — T3: T3, Total: 107 ng/dL (ref 71–180)

## 2020-06-26 NOTE — Progress Notes (Signed)
Please let the patient know that her thyroid panel is normal.  thanks

## 2020-07-08 ENCOUNTER — Other Ambulatory Visit: Payer: Self-pay

## 2020-07-08 ENCOUNTER — Ambulatory Visit: Payer: Medicare Other

## 2020-07-08 DIAGNOSIS — E042 Nontoxic multinodular goiter: Secondary | ICD-10-CM | POA: Diagnosis not present

## 2020-07-18 DIAGNOSIS — L659 Nonscarring hair loss, unspecified: Secondary | ICD-10-CM | POA: Insufficient documentation

## 2020-07-18 DIAGNOSIS — E042 Nontoxic multinodular goiter: Secondary | ICD-10-CM | POA: Insufficient documentation

## 2020-07-18 NOTE — Progress Notes (Signed)
Normal thyroid. Review at next visit 07/27/2020

## 2020-07-27 ENCOUNTER — Ambulatory Visit: Payer: Medicare Other | Admitting: Nurse Practitioner

## 2020-08-05 ENCOUNTER — Other Ambulatory Visit: Payer: Self-pay | Admitting: Nurse Practitioner

## 2020-08-05 DIAGNOSIS — G8929 Other chronic pain: Secondary | ICD-10-CM

## 2020-08-05 MED ORDER — TRAMADOL HCL 50 MG PO TABS: 100.0000 mg | ORAL_TABLET | Freq: Two times a day (BID) | ORAL | 2 refills | Status: DC

## 2020-08-10 DIAGNOSIS — H43812 Vitreous degeneration, left eye: Secondary | ICD-10-CM | POA: Diagnosis not present

## 2020-08-25 ENCOUNTER — Ambulatory Visit (INDEPENDENT_AMBULATORY_CARE_PROVIDER_SITE_OTHER): Payer: Medicare Other | Admitting: Nurse Practitioner

## 2020-08-25 ENCOUNTER — Encounter: Payer: Self-pay | Admitting: Nurse Practitioner

## 2020-08-25 ENCOUNTER — Other Ambulatory Visit: Payer: Self-pay

## 2020-08-25 VITALS — BP 142/88 | HR 73 | Temp 97.4°F | Resp 16 | Ht 65.5 in | Wt 163.4 lb

## 2020-08-25 DIAGNOSIS — G8929 Other chronic pain: Secondary | ICD-10-CM | POA: Diagnosis not present

## 2020-08-25 DIAGNOSIS — E559 Vitamin D deficiency, unspecified: Secondary | ICD-10-CM

## 2020-08-25 DIAGNOSIS — E042 Nontoxic multinodular goiter: Secondary | ICD-10-CM | POA: Diagnosis not present

## 2020-08-25 DIAGNOSIS — I10 Essential (primary) hypertension: Secondary | ICD-10-CM

## 2020-08-25 DIAGNOSIS — M5441 Lumbago with sciatica, right side: Secondary | ICD-10-CM

## 2020-08-25 DIAGNOSIS — M5442 Lumbago with sciatica, left side: Secondary | ICD-10-CM | POA: Diagnosis not present

## 2020-08-25 MED ORDER — ERGOCALCIFEROL 1.25 MG (50000 UT) PO CAPS: 50000.0000 [IU] | ORAL_CAPSULE | ORAL | 5 refills | Status: DC

## 2020-08-25 NOTE — Progress Notes (Signed)
[facility] [address] [place], [place] [address]  Internal MEDICINE  Office Visit Note  Patient Name: Angie [name]  [id number]  [id number]  Date of Service: 08/25/2020  Chief Complaint  Patient presents with  . Follow-up  . Hypertension    The patient is here for routine follow up.  -had thyroid ultrasound done. In 2019, she had ultrasound showing multiple, small, cystic lesions to both lobes of the thyroid. This ultrasound was normal without any evidence of cysts or nodules.  -blood pressure stable.  -no new concerns or complaints today.       Current Medication: Outpatient Encounter Medications as of 08/25/2020  Medication Sig  . Biotin 1 MG CAPS Take 1,000 mcg by mouth daily.  . cyclobenzaprine (FLEXERIL) 10 MG tablet Take 10 mg by mouth as needed for muscle spasms.  . ergocalciferol (DRISDOL) 1.25 MG (50000 UT) capsule Take 1 capsule (50,000 Units total) by mouth once a week.  . hydrocortisone (ANUSOL-HC) 25 MG suppository Place 1 suppository (25 mg total) rectally 2 (two) times daily.  . hydrocortisone (PROCTOSOL HC) 2.5 % rectal cream Place 1 application rectally 2 (two) times daily.  Marland Kitchen lisinopril-hydrochlorothiazide (ZESTORETIC) 10-12.5 MG tablet Take 1 tab po daily  . pantoprazole (PROTONIX) 40 MG tablet Take 1 tablet (40 mg total) by mouth daily.  . traMADol (ULTRAM) 50 MG tablet Take 2 tablets (100 mg total) by mouth 2 (two) times daily.  . [DISCONTINUED] ergocalciferol (DRISDOL) 1.25 MG (50000 UT) capsule Take 1 capsule (50,000 Units total) by mouth once a week.   No facility-administered encounter medications on file as of 08/25/2020.    Surgical History: Past Surgical History:  Procedure Laterality Date  . COLONOSCOPY WITH PROPOFOL N/A 10/04/2016   Procedure: COLONOSCOPY WITH PROPOFOL;  Surgeon: Christena Deem, MD;  Location: [facility] ENDOSCOPY;  Service: Endoscopy;  Laterality: N/A;  . ESOPHAGOGASTRODUODENOSCOPY (EGD) WITH PROPOFOL N/A 05/13/2016    Procedure: ESOPHAGOGASTRODUODENOSCOPY (EGD) WITH PROPOFOL;  Surgeon: Christena Deem, MD;  Location: [facility] ENDOSCOPY;  Service: Endoscopy;  Laterality: N/A;  . ESOPHAGOGASTRODUODENOSCOPY (EGD) WITH PROPOFOL N/A 10/19/2018   Procedure: ESOPHAGOGASTRODUODENOSCOPY (EGD) WITH PROPOFOL;  Surgeon: Christena Deem, MD;  Location: [facility] ENDOSCOPY;  Service: Endoscopy;  Laterality: N/A;  . JOINT REPLACEMENT Left 2015  . TONSILLECTOMY    . TONSILLECTOMY    . TOTAL KNEE ARTHROPLASTY      Medical History: Past Medical History:  Diagnosis Date  . Arthritis   . Esophagitis, reflux   . Gastritis   . Hepatitis C 2014   treated with Harvoni  . History of colon polyps   . Hypertension   . Kidney stone   . Melanoma (HCC)   . Osteoarthritis   . Sepsis due to urinary tract infection (HCC) 2014  . Thrombocytopenia, primary (HCC)    Dr Orlie Dakin    Family History: Family History  Problem Relation Age of Onset  . Liver cancer Father   . Cancer Father   . COPD Father   . Heart disease Father   . COPD Mother   . Hypertension Mother   . Diabetes Sister   . Diabetes Paternal Grandmother   . Heart disease Paternal Grandmother   . Hypertension Paternal Grandmother   . Diabetes Paternal Grandfather   . Heart disease Paternal Grandfather   . Hypertension Paternal Grandfather   . Breast cancer Neg Hx     Social History   Socioeconomic History  . Marital status: Married    Spouse name: Not  on file  . Number of children: Not on file  . Years of education: Not on file  . Highest education level: Not on file  Occupational History  . Not on file  Tobacco Use  . Smoking status: Former Smoker    Types: Cigarettes  . Smokeless tobacco: Never Used  Vaping Use  . Vaping Use: Never used  Substance and Sexual Activity  . Alcohol use: Yes    Alcohol/week: 1.0 standard drink    Types: 1 Glasses of wine per week    Comment: occasionally   . Drug use: No  . Sexual activity: Yes    Birth  control/protection: None, Post-menopausal  Other Topics Concern  . Not on file  Social History Narrative  . Not on file   Social Determinants of Health   Financial Resource Strain: Not on file  Food Insecurity: Not on file  Transportation Needs: Not on file  Physical Activity: Not on file  Stress: Not on file  Social Connections: Not on file  Intimate Partner Violence: Not on file      Review of Systems  Constitutional: Negative for activity change, chills, fatigue and unexpected weight change.  HENT: Negative for congestion, postnasal drip, rhinorrhea, sneezing and sore throat.   Respiratory: Negative for cough, chest tightness, shortness of breath and wheezing.   Cardiovascular: Negative for chest pain and palpitations.  Gastrointestinal: Negative for abdominal pain, constipation, diarrhea, nausea and vomiting.  Endocrine: Negative for cold intolerance, heat intolerance, polydipsia and polyuria.  Musculoskeletal: Negative for arthralgias, back pain, joint swelling and neck pain.       Chronic hip pain.   Skin: Negative for rash.  Allergic/Immunologic: Negative for environmental allergies.  Neurological: Negative for dizziness, tremors, numbness and headaches.  Hematological: Negative for adenopathy. Does not bruise/bleed easily.  Psychiatric/Behavioral: Negative for behavioral problems (Depression), sleep disturbance and suicidal ideas. The patient is not nervous/anxious.     Today's Vitals   08/25/20 0926  BP: (!) 142/88  Pulse: 73  Resp: 16  Temp: (!) 97.4 F (36.3 C)  SpO2: 97%  Weight: 163 lb 6.4 oz (74.1 kg)  Height: 5' 5.5" (1.664 m)   Body mass index is 26.78 kg/m.  Physical Exam Vitals and nursing note reviewed.  Constitutional:      General: She is not in acute distress.    Appearance: Normal appearance. She is well-developed and well-nourished. She is not diaphoretic.  HENT:     Head: Normocephalic and atraumatic.     Mouth/Throat:     Mouth:  Oropharynx is clear and moist.     Pharynx: No oropharyngeal exudate.  Eyes:     Extraocular Movements: EOM normal.     Pupils: Pupils are equal, round, and reactive to light.  Neck:     Thyroid: No thyromegaly.     Vascular: No carotid bruit or JVD.     Trachea: No tracheal deviation.  Cardiovascular:     Rate and Rhythm: Normal rate and regular rhythm.     Heart sounds: Normal heart sounds. No murmur heard. No friction rub. No gallop.   Pulmonary:     Effort: Pulmonary effort is normal. No respiratory distress.     Breath sounds: Normal breath sounds. No wheezing or rales.  Chest:     Chest wall: No tenderness.  Abdominal:     Palpations: Abdomen is soft.  Musculoskeletal:        General: Normal range of motion.     Cervical back: Normal range  of motion and neck supple.  Lymphadenopathy:     Cervical: No cervical adenopathy.  Skin:    General: Skin is warm and dry.  Neurological:     Mental Status: She is alert and oriented to person, place, and time.     Cranial Nerves: No cranial nerve deficit.  Psychiatric:        Mood and Affect: Mood and affect and mood normal.        Behavior: Behavior normal.        Thought Content: Thought content normal.        Judgment: Judgment normal.    Assessment/Plan: 1. Multinodular goiter Reviewed recent thyroid ultrasound which was normal. Will monitor closely  2. Vitamin D deficiency May continue drisdol weekly.  - ergocalciferol (DRISDOL) 1.25 MG (50000 UT) capsule; Take 1 capsule (50,000 Units total) by mouth once a week.  Dispense: 4 capsule; Refill: 5  3. Essential hypertension Stable. Continue bp medication as prescribed   4. Chronic low back pain with bilateral sciatica, unspecified back pain laterality maytake previously prescribed tramadol as needed and as prescribed.    General Counseling: zahriah roes understanding of the findings of todays visit and agrees with plan of treatment. I have discussed any further  diagnostic evaluation that may be needed or ordered today. We also reviewed her medications today. she has been encouraged to call the office with any questions or concerns that should arise related to todays visit.  This patient was seen by Vincent Gros FNP Collaboration with Dr Lyndon Code as a part of collaborative care agreement  Meds ordered this encounter  Medications  . ergocalciferol (DRISDOL) 1.25 MG (50000 UT) capsule    Sig: Take 1 capsule (50,000 Units total) by mouth once a week.    Dispense:  4 capsule    Refill:  5    Order Specific Question:   Supervising Provider    Answer:   Lyndon Code [[id number]]    Total time spent: 25 Minutes   Time spent includes review of chart, medications, test results, and follow up plan with the patient.      Dr Lyndon Code Internal medicine

## 2020-10-05 ENCOUNTER — Ambulatory Visit: Payer: Medicare Other | Admitting: Physician Assistant

## 2020-10-06 ENCOUNTER — Other Ambulatory Visit: Payer: Self-pay | Admitting: Gastroenterology

## 2020-10-06 DIAGNOSIS — K7460 Unspecified cirrhosis of liver: Secondary | ICD-10-CM

## 2020-10-20 ENCOUNTER — Other Ambulatory Visit: Payer: Self-pay

## 2020-10-20 ENCOUNTER — Ambulatory Visit
Admission: RE | Admit: 2020-10-20 | Discharge: 2020-10-20 | Disposition: A | Discharge status: Home / Self Care | Payer: Medicare Other | Source: Ambulatory Visit | Attending: Gastroenterology | Admitting: Gastroenterology

## 2020-10-20 DIAGNOSIS — K7460 Unspecified cirrhosis of liver: Secondary | ICD-10-CM | POA: Diagnosis not present

## 2020-11-24 ENCOUNTER — Ambulatory Visit: Payer: Medicare Other | Admitting: Internal Medicine

## 2020-11-26 ENCOUNTER — Ambulatory Visit: Payer: Medicare Other | Admitting: Nurse Practitioner

## 2020-12-21 ENCOUNTER — Other Ambulatory Visit: Payer: Self-pay

## 2020-12-21 ENCOUNTER — Ambulatory Visit (INDEPENDENT_AMBULATORY_CARE_PROVIDER_SITE_OTHER): Payer: Medicare Other | Admitting: Nurse Practitioner

## 2020-12-21 ENCOUNTER — Telehealth: Payer: Self-pay | Admitting: Nurse Practitioner

## 2020-12-21 ENCOUNTER — Encounter: Payer: Self-pay | Admitting: Nurse Practitioner

## 2020-12-21 VITALS — BP 112/64 | HR 102 | Temp 98.1°F | Ht 65.5 in | Wt 171.5 lb

## 2020-12-21 DIAGNOSIS — G8929 Other chronic pain: Secondary | ICD-10-CM

## 2020-12-21 DIAGNOSIS — Z7689 Persons encountering health services in other specified circumstances: Secondary | ICD-10-CM | POA: Diagnosis not present

## 2020-12-21 DIAGNOSIS — Z6828 Body mass index (BMI) 28.0-28.9, adult: Secondary | ICD-10-CM | POA: Diagnosis not present

## 2020-12-21 DIAGNOSIS — M5441 Lumbago with sciatica, right side: Secondary | ICD-10-CM | POA: Diagnosis not present

## 2020-12-21 DIAGNOSIS — I10 Essential (primary) hypertension: Secondary | ICD-10-CM | POA: Diagnosis not present

## 2020-12-21 DIAGNOSIS — Z1231 Encounter for screening mammogram for malignant neoplasm of breast: Secondary | ICD-10-CM

## 2020-12-21 DIAGNOSIS — M5442 Lumbago with sciatica, left side: Secondary | ICD-10-CM

## 2020-12-21 MED ORDER — PHENTERMINE HCL 37.5 MG PO TABS: 37.5000 mg | ORAL_TABLET | Freq: Every day | ORAL | 0 refills | Status: DC

## 2020-12-21 MED ORDER — TRAMADOL HCL 50 MG PO TABS: 100.0000 mg | ORAL_TABLET | Freq: Two times a day (BID) | ORAL | 2 refills | Status: DC

## 2020-12-21 NOTE — Progress Notes (Signed)
New Patient Office Visit  Subjective:  Patient ID: Angie [name], female    DOB: 1955/12/08  Age: 65 y.o. MRN: [id number]  CC:  Chief Complaint  Patient presents with  . New Patient (Initial Visit)    HPI Angie [name] presents to establish care in new primary care office. She has opted to transfer to new office with previous primary care provider to maintain continuity of care. She has history of hypertension. Blood pressure is well managed today. No negative side effects from blood pressure medication.  She is concerned about weight gain. States that she has gained considerable weight since her most recent visit with me from 08/2020. She has gained 8 pounds. She has taken phentermine for this in the past. Was successful on this medication without negative side effects. She was doing very well without this medication for several months, maintaining a BMI of 26 to 27. She is unsure the underlying cuase, but she has had decreased physical activity and has been eating out a little more often. She plans to restart closely monitoring her calorie intake, limiting it to 1200 - 1500 calories per day and increasing her physical activity.  Has had some increased lower back pain and pain in right hip. She states that there has been considerable increase in pain in right buttock area. This gets worse with exertion and after sitting or standing for longer periods of time. States that it will cause her to stop doing whatever she is doing and stretch out the right hip and buttock muscle. She denies injury or trauma to the right hip. She generally takes tramadol 100mg  twice daily when needed to help control her pain. She is unable to take NSAIDs due to allergy. She is not able to take products containing acetaminophen due to history of NASH and hepatitis C. Reviewed her PDMP profile today. Her overdose risk score is 170 and her fill history is appropriate. The last time she filled tramadol was 10/12/2020.  She  does see liver specialist for NASH and history of hepatitis c. She had most recent abdominal ultrasound in 10/2020 showing changes consistent with cirrhosis without acute or worrisome changes.  She is overdue for her screening mammogram.   Past Medical History:  Diagnosis Date  . Arthritis   . Esophagitis, reflux   . Gastritis   . Hepatitis C 2014   treated with Harvoni  . History of colon polyps   . Hypertension   . Kidney stone   . Melanoma ([place])   . Osteoarthritis   . Sepsis due to urinary tract infection ([place]) 2014  . Thrombocytopenia, primary (HCC)    Dr Grayland Ormond    Past Surgical History:  Procedure Laterality Date  . COLONOSCOPY WITH PROPOFOL N/A 10/04/2016   Procedure: COLONOSCOPY WITH PROPOFOL;  Surgeon: Lollie Sails, MD;  Location: [facility] ENDOSCOPY;  Service: Endoscopy;  Laterality: N/A;  . ESOPHAGOGASTRODUODENOSCOPY (EGD) WITH PROPOFOL N/A 05/13/2016   Procedure: ESOPHAGOGASTRODUODENOSCOPY (EGD) WITH PROPOFOL;  Surgeon: Lollie Sails, MD;  Location: [facility] ENDOSCOPY;  Service: Endoscopy;  Laterality: N/A;  . ESOPHAGOGASTRODUODENOSCOPY (EGD) WITH PROPOFOL N/A 10/19/2018   Procedure: ESOPHAGOGASTRODUODENOSCOPY (EGD) WITH PROPOFOL;  Surgeon: Lollie Sails, MD;  Location: [facility] ENDOSCOPY;  Service: Endoscopy;  Laterality: N/A;  . JOINT REPLACEMENT Left 2015  . TONSILLECTOMY    . TONSILLECTOMY    . TOTAL KNEE ARTHROPLASTY      Family History  Problem Relation Age of Onset  . Liver cancer Father   . Cancer  Father   . COPD Father   . Heart disease Father   . COPD Mother   . Hypertension Mother   . Diabetes Sister   . Diabetes Paternal Grandmother   . Heart disease Paternal Grandmother   . Hypertension Paternal Grandmother   . Diabetes Paternal Grandfather   . Heart disease Paternal Grandfather   . Hypertension Paternal Grandfather   . Breast cancer Neg Hx     Social History   Socioeconomic History  . Marital status: Married    Spouse name: Jagger Beahm   . Number of children: 3  . Years of education: Not on file  . Highest education level: Not on file  Occupational History  . Not on file  Tobacco Use  . Smoking status: Former Smoker    Types: Cigarettes  . Smokeless tobacco: Never Used  Vaping Use  . Vaping Use: Never used  Substance and Sexual Activity  . Alcohol use: Yes    Alcohol/week: 1.0 standard drink    Types: 1 Glasses of wine per week    Comment: occasionally   . Drug use: No  . Sexual activity: Yes    Birth control/protection: None, Post-menopausal  Other Topics Concern  . Not on file  Social History Narrative  . Not on file   Social Determinants of Health   Financial Resource Strain: Not on file  Food Insecurity: Not on file  Transportation Needs: Not on file  Physical Activity: Not on file  Stress: Not on file  Social Connections: Not on file  Intimate Partner Violence: Not on file    ROS Review of Systems  Constitutional: Negative for chills, fatigue and fever.       Eight pound weight gain since most recent visit.   HENT: Negative for congestion, sinus pressure and sinus pain.   Eyes: Negative.   Respiratory: Negative for cough, chest tightness and wheezing.   Cardiovascular: Negative for chest pain and palpitations.  Gastrointestinal: Negative for constipation, diarrhea, nausea and vomiting.  Endocrine: Negative for cold intolerance, heat intolerance, polydipsia and polyuria.  Musculoskeletal: Positive for arthralgias, back pain and myalgias.       Chronic right hip pain and recent bout with piriformis syndrome.   Skin: Negative for rash.  Allergic/Immunologic: Negative.   Neurological: Negative for dizziness, weakness and headaches.  Hematological: Negative for adenopathy.  Psychiatric/Behavioral: Negative for decreased concentration. The patient is not nervous/anxious.   All other systems reviewed and are negative.   Objective:   Today's Vitals   12/21/20 1319  BP: 112/64  Pulse: (!) 102   Temp: 98.1 F (36.7 C)  SpO2: 99%  Weight: 171 lb 8 oz (77.8 kg)  Height: 5' 5.5" (1.664 m)   Body mass index is 28.11 kg/m.   Physical Exam Vitals and nursing note reviewed.  Constitutional:      Appearance: Normal appearance. She is well-developed.  HENT:     Head: Normocephalic and atraumatic.     Nose: Nose normal.  Eyes:     Extraocular Movements: Extraocular movements intact.     Conjunctiva/sclera: Conjunctivae normal.     Pupils: Pupils are equal, round, and reactive to light.  Neck:     Vascular: No carotid bruit.  Cardiovascular:     Rate and Rhythm: Normal rate and regular rhythm.     Pulses: Normal pulses.     Heart sounds: Normal heart sounds.  Pulmonary:     Effort: Pulmonary effort is normal.     Breath sounds:  Normal breath sounds.  Abdominal:     Palpations: Abdomen is soft.  Musculoskeletal:        General: Normal range of motion.     Cervical back: Normal range of motion and neck supple.     Comments: Moderate lower back pain, radiating into the right hip and upper leg. This is worse with bending and twisting at the waist. No visible or palpable abnormalities are noted. ROM and strength are intact.   Skin:    General: Skin is warm and dry.     Capillary Refill: Capillary refill takes less than 2 seconds.  Neurological:     General: No focal deficit present.     Mental Status: She is alert and oriented to person, place, and time.  Psychiatric:        Mood and Affect: Mood normal.        Behavior: Behavior normal.        Thought Content: Thought content normal.        Judgment: Judgment normal.     Assessment & Plan:  1. Encounter to establish care Appointment today to establish care with new primary care office, keeping current PCP and maintaining the continuity of care.   2. Essential hypertension Stable. Continue bp medication as prescribed   3. Chronic low back pain with bilateral sciatica, unspecified back pain laterality May continue  to take tramadol 100mg  up to twice daily up to twice daily if needed for moderate pain. Reviewed PDMP profile. Overdose risk factor is 170 and fill history is appropriate. New prescription sent to her pharmacy today.  - traMADol (ULTRAM) 50 MG tablet; Take 2 tablets (100 mg total) by mouth 2 (two) times daily.  Dispense: 120 tablet; Refill: 2  4. BMI 28.0-28.9,adult Restart phentermine. Maintain calorie intake to 1200-1500 calories per day. Gradually incorporate exercise into daily routine.  - phentermine (ADIPEX-P) 37.5 MG tablet; Take 1 tablet (37.5 mg total) by mouth daily before breakfast.  Dispense: 30 tablet; Refill: 0  5. Encounter for screening mammogram for malignant neoplasm of breast - MM DIGITAL SCREENING BILATERAL; Future  Problem List Items Addressed This Visit      Cardiovascular and Mediastinum   Essential hypertension     Nervous and Auditory   Chronic low back pain with bilateral sciatica   Relevant Medications   traMADol (ULTRAM) 50 MG tablet   phentermine (ADIPEX-P) 37.5 MG tablet     Other   Encounter for screening mammogram for malignant neoplasm of breast   Relevant Orders   MM DIGITAL SCREENING BILATERAL   Encounter to establish care - Primary   BMI 28.0-28.9,adult   Relevant Medications   phentermine (ADIPEX-P) 37.5 MG tablet      Outpatient Encounter Medications as of 12/21/2020  Medication Sig  . Biotin 1 MG CAPS Take 1,000 mcg by mouth daily.  . ergocalciferol (DRISDOL) 1.25 MG (50000 UT) capsule Take 1 capsule (50,000 Units total) by mouth once a week.  . hydrocortisone (ANUSOL-HC) 25 MG suppository Place 1 suppository (25 mg total) rectally 2 (two) times daily.  . hydrocortisone (PROCTOSOL HC) 2.5 % rectal cream Place 1 application rectally 2 (two) times daily.  Marland Kitchen lisinopril-hydrochlorothiazide (ZESTORETIC) 10-12.5 MG tablet Take 1 tab po daily  . pantoprazole (PROTONIX) 40 MG tablet Take 1 tablet (40 mg total) by mouth daily.  . phentermine  (ADIPEX-P) 37.5 MG tablet Take 1 tablet (37.5 mg total) by mouth daily before breakfast.  . [DISCONTINUED] traMADol (ULTRAM) 50 MG tablet Take 2  tablets (100 mg total) by mouth 2 (two) times daily.  . cyclobenzaprine (FLEXERIL) 10 MG tablet Take 10 mg by mouth as needed for muscle spasms. (Patient not taking: Reported on 12/21/2020)  . traMADol (ULTRAM) 50 MG tablet Take 2 tablets (100 mg total) by mouth 2 (two) times daily.   No facility-administered encounter medications on file as of 12/21/2020.    Follow-up: Return in about 4 weeks (around 01/18/2021) for weight loss .   Ronnell Freshwater, NP

## 2020-12-21 NOTE — Patient Instructions (Signed)
Chronic Back Pain When back pain lasts longer than 3 months, it is called chronic back pain. Pain may get worse at certain times (flare-ups). There are things you can do at home to manage your pain. Follow these instructions at home: Pay attention to any changes in your symptoms. Take these actions to help with your pain: Managing pain and stiffness  If told, put ice on the painful area. Your doctor may tell you to use ice for 24-48 hours after the flare-up starts. To do this: ? Put ice in a plastic bag. ? Place a towel between your skin and the bag. ? Leave the ice on for 20 minutes, 2-3 times a day.  If told, put heat on the painful area. Do this as often as told by your doctor. Use the heat source that your doctor recommends, such as a moist heat pack or a heating pad. ? Place a towel between your skin and the heat source. ? Leave the heat on for 20-30 minutes. ? Take off the heat if your skin turns bright red. This is especially important if you are unable to feel pain, heat, or cold. You may have a greater risk of getting burned.  Soak in a warm bath. This can help relieve pain.      Activity  Avoid bending and other activities that make pain worse.  When standing: ? Keep your upper back and neck straight. ? Keep your shoulders pulled back. ? Avoid slouching.  When sitting: ? Keep your back straight. ? Relax your shoulders. Do not round your shoulders or pull them backward.  Do not sit or stand in one place for long periods of time.  Take short rest breaks during the day. Lying down or standing is usually better than sitting. Resting can help relieve pain.  When sitting or lying down for a long time, do some mild activity or stretching. This will help to prevent stiffness and pain.  Get regular exercise. Ask your doctor what activities are safe for you.  Do not lift anything that is heavier than 10 lb (4.5 kg) or the limit that you are told, until your doctor says that it  is safe.  To prevent injury when you lift things: ? Bend your knees. ? Keep the weight close to your body. ? Avoid twisting.  Sleep on a firm mattress. Try lying on your side with your knees slightly bent. If you lie on your back, put a pillow under your knees.   Medicines  Treatment may include medicines for pain and swelling taken by mouth or put on the skin, prescription pain medicine, or muscle relaxants.  Take over-the-counter and prescription medicines only as told by your doctor.  Ask your doctor if the medicine prescribed to you: ? Requires you to avoid driving or using machinery. ? Can cause trouble pooping (constipation). You may need to take these actions to prevent or treat trouble pooping:  Drink enough fluid to keep your pee (urine) pale yellow.  Take over-the-counter or prescription medicines.  Eat foods that are high in fiber. These include beans, whole grains, and fresh fruits and vegetables.  Limit foods that are high in fat and sugars. These include fried or sweet foods. General instructions  Do not use any products that contain nicotine or tobacco, such as cigarettes, e-cigarettes, and chewing tobacco. If you need help quitting, ask your doctor.  Keep all follow-up visits as told by your doctor. This is important. Contact a doctor if:    Your pain does not get better with rest or medicine.  Your pain gets worse, or you have new pain.  You have a high fever.  You lose weight very quickly.  You have trouble doing your normal activities. Get help right away if:  One or both of your legs or feet feel weak.  One or both of your legs or feet lose feeling (have numbness).  You have trouble controlling when you poop (have a bowel movement) or pee (urinate).  You have bad back pain and: ? You feel like you may vomit (nauseous), or you vomit. ? You have pain in your belly (abdomen). ? You have shortness of breath. ? You faint. Summary  When back pain  lasts longer than 3 months, it is called chronic back pain.  Pain may get worse at certain times (flare-ups).  Use ice and heat as told by your doctor. Your doctor may tell you to use ice after flare-ups. This information is not intended to replace advice given to you by your health care provider. Make sure you discuss any questions you have with your health care provider. Document Revised: 09/18/2019 Document Reviewed: 09/18/2019 Elsevier Patient Education  2021 [employer].  Piriformis Syndrome Rehab Ask your health care provider which exercises are safe for you. Do exercises exactly as told by your health care provider and adjust them as directed. It is normal to feel mild stretching, pulling, tightness, or discomfort as you do these exercises. Stop right away if you feel sudden pain or your pain gets worse. Do not begin these exercises until told by your health care provider. Stretching and range-of-motion exercises These exercises warm up your muscles and joints and improve the movement and flexibility of your hip and pelvis. The exercises also help to relieve pain, numbness, and tingling. Hip rotation This is an exercise in which you lie on your back and stretch the muscles that rotate your hip (hip rotators) to stretch your buttocks. 1. Lie on your back on a firm surface. 2. Pull your left / right knee toward your same shoulder with your left / right hand until your knee is pointing toward the ceiling. Hold your left / right ankle with your other hand. 3. Keeping your knee steady, gently pull your left / right ankle toward your other shoulder until you feel a stretch in your buttocks. 4. Hold this position for __________ seconds. Repeat __________ times. Complete this exercise __________ times a day.   Hip extensor This is an exercise in which you lie on your back and pull your knee to your chest. 1. Lie on your back on a firm surface. Both of your legs should be straight. 2. Pull  your left / right knee to your chest. Hold your leg in this position by holding onto the back of your thigh or the front of your knee. 3. Hold this position for __________ seconds. 4. Slowly return to the starting position. Repeat __________ times. Complete this exercise __________ times a day. Strengthening exercises These exercises build strength and endurance in your hip and thigh muscles. Endurance is the ability to use your muscles for a long time, even after they get tired. Straight leg raises, side-lying This exercise strengthens the muscles that rotate the leg at the hip and move it away from your body (hip abductors). 1. Lie on your side with your left / right leg in the top position. Lie so your head, shoulder, knee, and hip line up. Bend your bottom  knee to help you balance. 2. Lift your top leg 4-6 inches (10-15 cm) while keeping your toes pointed straight ahead. 3. Hold this position for __________ seconds. 4. Slowly lower your leg to the starting position. 5. Let your muscles relax completely after each repetition. Repeat __________ times. Complete this exercise __________ times a day.   Hip abduction and rotation This is sometimes called quadruped (on hands and knees) exercises. 1. Get on your hands and knees on a firm, lightly padded surface. Your hands should be directly below your shoulders, and your knees should be directly below your hips. 2. Lift your left / right knee out to the side. Keep your knee bent. Do not twist your body. 3. Hold this position for __________ seconds. 4. Slowly lower your leg. Repeat __________ times. Complete this exercise __________ times a day.   Straight leg raises, face-down This exercise stretches the muscles that move your hips away from the front of the pelvis (hip extensors). 1. Lie on your abdomen on a bed or a firm surface with a pillow under your hips. 2. Squeeze your buttocks muscles and lift your left / right leg about 4-6 inches  (10-15 cm) off the bed. Do not let your back arch. 3. Hold this position for __________ seconds. 4. Slowly lower your leg to the starting position. 5. Let your muscles relax completely after each repetition. Repeat __________ times. Complete this exercise __________ times a day. This information is not intended to replace advice given to you by your health care provider. Make sure you discuss any questions you have with your health care provider. Document Revised: 11/29/2018 Document Reviewed: 05/31/2018 Elsevier Patient Education  2021 [employer].

## 2020-12-21 NOTE — Progress Notes (Signed)
  Chronic Care Management   Outreach Note  12/21/2020 Name: RAINEE SWEATT MRN: [id number] DOB: [birth year]  Referred by: Ronnell Freshwater, NP Reason for referral : No chief complaint on file.   An unsuccessful telephone outreach was attempted today. The patient was referred to the pharmacist for assistance with care management and care coordination.   Follow Up Plan:   [facility] [employer]

## 2021-01-03 DIAGNOSIS — Z6828 Body mass index (BMI) 28.0-28.9, adult: Secondary | ICD-10-CM | POA: Insufficient documentation

## 2021-01-03 DIAGNOSIS — E669 Obesity, unspecified: Secondary | ICD-10-CM | POA: Insufficient documentation

## 2021-01-03 DIAGNOSIS — Z7689 Persons encountering health services in other specified circumstances: Secondary | ICD-10-CM | POA: Insufficient documentation

## 2021-01-06 ENCOUNTER — Telehealth: Payer: Self-pay | Admitting: Nurse Practitioner

## 2021-01-06 NOTE — Progress Notes (Signed)
  Chronic Care Management   Outreach Note  01/06/2021 Name: Angie [name] MRN: [id number] DOB: 1956/06/26  Referred by: Ronnell Freshwater, NP Reason for referral : No chief complaint on file.   A second unsuccessful telephone outreach was attempted today. The patient was referred to pharmacist for assistance with care management and care coordination.  Follow Up Plan:   [place]

## 2021-01-19 ENCOUNTER — Encounter: Payer: Self-pay | Admitting: Nurse Practitioner

## 2021-01-19 ENCOUNTER — Ambulatory Visit (INDEPENDENT_AMBULATORY_CARE_PROVIDER_SITE_OTHER): Payer: Medicare Other | Admitting: Nurse Practitioner

## 2021-01-19 ENCOUNTER — Other Ambulatory Visit: Payer: Self-pay

## 2021-01-19 VITALS — BP 116/69 | HR 98 | Ht 65.2 in | Wt 165.5 lb

## 2021-01-19 DIAGNOSIS — I10 Essential (primary) hypertension: Secondary | ICD-10-CM

## 2021-01-19 DIAGNOSIS — Z6827 Body mass index (BMI) 27.0-27.9, adult: Secondary | ICD-10-CM | POA: Diagnosis not present

## 2021-01-19 DIAGNOSIS — Z6828 Body mass index (BMI) 28.0-28.9, adult: Secondary | ICD-10-CM

## 2021-01-19 MED ORDER — PHENTERMINE HCL 37.5 MG PO TABS: 37.5000 mg | ORAL_TABLET | Freq: Every day | ORAL | 0 refills | Status: DC

## 2021-01-19 NOTE — Progress Notes (Signed)
Established Patient Office Visit  Subjective:  Patient ID: Angie [name], female    DOB: May 07, 1956  Age: 65 y.o. MRN: [id number]  CC:  Chief Complaint  Patient presents with  . Follow-up  . Weight Check    HPI Angie [name] presents for follow up of weight management. She was started on phentermine at her most recent visit. She has lost 6 pounds since then. She is eating a 1200 calorie diet and has increased her physical activity. She states that she feels well and and has no negative side effects associated with taking phentermine.Her blood pressure is well managed. She states that she is sleeping well. She has mild, intermittent constipation.  She denies other concerns or complaints. She denies chest pain, chest pressure, or shortness of breath. She denies headaches or visual disturbances. She denies abdominal pain, nausea, vomiting, or changes in bowel or bladder habits.   Past Medical History:  Diagnosis Date  . Arthritis   . Esophagitis, reflux   . Gastritis   . Hepatitis C 2014   treated with Harvoni  . History of colon polyps   . Hypertension   . Kidney stone   . Melanoma ([place])   . Osteoarthritis   . Sepsis due to urinary tract infection ([place]) 2014  . Thrombocytopenia, primary ([facility])    Dr Grayland Ormond    Past Surgical History:  Procedure Laterality Date  . COLONOSCOPY WITH PROPOFOL N/A 10/04/2016   Procedure: COLONOSCOPY WITH PROPOFOL;  Surgeon: Lollie Sails, MD;  Location: [facility] ENDOSCOPY;  Service: Endoscopy;  Laterality: N/A;  . ESOPHAGOGASTRODUODENOSCOPY (EGD) WITH PROPOFOL N/A 05/13/2016   Procedure: ESOPHAGOGASTRODUODENOSCOPY (EGD) WITH PROPOFOL;  Surgeon: Lollie Sails, MD;  Location: [facility] ENDOSCOPY;  Service: Endoscopy;  Laterality: N/A;  . ESOPHAGOGASTRODUODENOSCOPY (EGD) WITH PROPOFOL N/A 10/19/2018   Procedure: ESOPHAGOGASTRODUODENOSCOPY (EGD) WITH PROPOFOL;  Surgeon: Lollie Sails, MD;  Location: [facility] ENDOSCOPY;  Service: Endoscopy;  Laterality:  N/A;  . JOINT REPLACEMENT Left 2015  . TONSILLECTOMY    . TONSILLECTOMY    . TOTAL KNEE ARTHROPLASTY      Family History  Problem Relation Age of Onset  . Liver cancer Father   . Cancer Father   . COPD Father   . Heart disease Father   . COPD Mother   . Hypertension Mother   . Diabetes Sister   . Diabetes Paternal Grandmother   . Heart disease Paternal Grandmother   . Hypertension Paternal Grandmother   . Diabetes Paternal Grandfather   . Heart disease Paternal Grandfather   . Hypertension Paternal Grandfather   . Breast cancer Neg Hx     Social History   Socioeconomic History  . Marital status: Married    Spouse name: Oswin Johal  . Number of children: 3  . Years of education: Not on file  . Highest education level: Not on file  Occupational History  . Not on file  Tobacco Use  . Smoking status: Former Smoker    Types: Cigarettes  . Smokeless tobacco: Never Used  Vaping Use  . Vaping Use: Never used  Substance and Sexual Activity  . Alcohol use: Yes    Alcohol/week: 1.0 standard drink    Types: 1 Glasses of wine per week    Comment: occasionally   . Drug use: No  . Sexual activity: Yes    Birth control/protection: None, Post-menopausal  Other Topics Concern  . Not on file  Social History Narrative  . Not on file   Social  Determinants of Health   Financial Resource Strain: Not on file  Food Insecurity: Not on file  Transportation Needs: Not on file  Physical Activity: Not on file  Stress: Not on file  Social Connections: Not on file  Intimate Partner Violence: Not on file    Outpatient Medications Prior to Visit  Medication Sig Dispense Refill  . Biotin 1 MG CAPS Take 1,000 mcg by mouth daily.    . cyclobenzaprine (FLEXERIL) 10 MG tablet Take 10 mg by mouth as needed for muscle spasms.    . ergocalciferol (DRISDOL) 1.25 MG (50000 UT) capsule Take 1 capsule (50,000 Units total) by mouth once a week. 4 capsule 5  . hydrocortisone (ANUSOL-HC) 25 MG  suppository Place 1 suppository (25 mg total) rectally 2 (two) times daily. 12 suppository 0  . hydrocortisone (PROCTOSOL HC) 2.5 % rectal cream Place 1 application rectally 2 (two) times daily. 30 g 0  . lisinopril-hydrochlorothiazide (ZESTORETIC) 10-12.5 MG tablet Take 1 tab po daily 30 tablet 5  . pantoprazole (PROTONIX) 40 MG tablet Take 1 tablet (40 mg total) by mouth daily. 30 tablet 5  . traMADol (ULTRAM) 50 MG tablet Take 2 tablets (100 mg total) by mouth 2 (two) times daily. 120 tablet 2  . phentermine (ADIPEX-P) 37.5 MG tablet Take 1 tablet (37.5 mg total) by mouth daily before breakfast. 30 tablet 0   No facility-administered medications prior to visit.    Allergies  Allergen Reactions  . Aspirin Nausea And Vomiting  . Nsaids Other (See Comments)    Liver function/[place]    ROS Review of Systems  Constitutional: Negative for activity change, chills and fever.       Six pound weight loss since her most recent visit   HENT: Negative for congestion, postnasal drip, rhinorrhea, sinus pressure and sinus pain.   Respiratory: Negative for cough, shortness of breath and wheezing.   Cardiovascular: Negative for chest pain and palpitations.  Gastrointestinal: Negative for constipation, diarrhea, nausea and vomiting.  Endocrine: Negative for cold intolerance, heat intolerance, polydipsia and polyuria.  Musculoskeletal: Positive for arthralgias. Negative for back pain and myalgias.       Chronic low back and hip tenderness.   Skin: Negative for rash.  Neurological: Negative for dizziness, weakness and headaches.  Psychiatric/Behavioral: Negative for dysphoric mood and sleep disturbance. The patient is not nervous/anxious.       Objective:    Physical Exam Vitals and nursing note reviewed.  Constitutional:      Appearance: Normal appearance. She is well-developed.  HENT:     Head: Normocephalic and atraumatic.     Mouth/Throat:     Mouth: Mucous membranes are moist.  Eyes:      Pupils: Pupils are equal, round, and reactive to light.  Cardiovascular:     Rate and Rhythm: Normal rate and regular rhythm.     Pulses: Normal pulses.     Heart sounds: Normal heart sounds.  Pulmonary:     Effort: Pulmonary effort is normal.     Breath sounds: Normal breath sounds.  Abdominal:     Palpations: Abdomen is soft.  Musculoskeletal:        General: Normal range of motion.     Cervical back: Normal range of motion and neck supple.  Skin:    General: Skin is warm and dry.     Capillary Refill: Capillary refill takes less than 2 seconds.  Neurological:     General: No focal deficit present.     Mental  Status: She is alert and oriented to person, place, and time.  Psychiatric:        Mood and Affect: Mood normal.        Behavior: Behavior normal.        Thought Content: Thought content normal.        Judgment: Judgment normal.     Today's Vitals   01/19/21 1336 01/19/21 1355  BP: 116/69   Pulse: (!) 102 98  SpO2: 98%   Weight: 165 lb 8 oz (75.1 kg)   Height: 5' 5.2" (1.656 m)    Body mass index is 27.37 kg/m.   Wt Readings from Last 3 Encounters:  01/19/21 165 lb 8 oz (75.1 kg)  12/21/20 171 lb 8 oz (77.8 kg)  08/25/20 163 lb 6.4 oz (74.1 kg)     Health Maintenance Due  Topic Date Due  . Pneumococcal Vaccine 75-87 Years old (1 of 2 - PPSV23) Never done  . HIV Screening  Never done  . TETANUS/TDAP  Never done  . MAMMOGRAM  10/18/2020    There are no preventive care reminders to display for this patient.  Lab Results  Component Value Date   TSH 1.729 06/25/2020   Lab Results  Component Value Date   WBC 6.7 10/07/2019   HGB 14.8 10/07/2019   HCT 44.0 10/07/2019   MCV 97.1 10/07/2019   PLT 167 10/07/2019   Lab Results  Component Value Date   NA 139 10/07/2019   K 3.9 10/07/2019   CO2 21 (L) 10/07/2019   GLUCOSE 112 (H) 10/07/2019   BUN 27 (H) 10/07/2019   CREATININE 0.52 10/07/2019   BILITOT 0.4 10/07/2019   ALKPHOS 54 10/07/2019    AST 32 10/07/2019   ALT 42 10/07/2019   PROT 7.8 10/07/2019   ALBUMIN 4.3 10/07/2019   CALCIUM 9.2 10/07/2019   ANIONGAP 10 10/07/2019   Lab Results  Component Value Date   CHOL 252 (H) 10/07/2019   Lab Results  Component Value Date   HDL 87 10/07/2019   Lab Results  Component Value Date   LDLCALC 133 (H) 10/07/2019   Lab Results  Component Value Date   TRIG 162 (H) 10/07/2019   Lab Results  Component Value Date   CHOLHDL 2.9 10/07/2019   Lab Results  Component Value Date   HGBA1C 5.7 (H) 03/08/2018      Assessment & Plan:  1. Essential hypertension Stable contine bp medication as prescribed   2. BMI 27.0-27.9,adult Improved. Phentermine started 12/21/2020. May continue for additional 30 days. Continue with 1200 calorie diet. Incorporate exercise into daily routine.  - phentermine (ADIPEX-P) 37.5 MG tablet; Take 1 tablet (37.5 mg total) by mouth daily before breakfast.  Dispense: 30 tablet; Refill: 0   Problem List Items Addressed This Visit      Cardiovascular and Mediastinum   Essential hypertension - Primary     Other   BMI 28.0-28.9,adult   Relevant Medications   phentermine (ADIPEX-P) 37.5 MG tablet      Meds ordered this encounter  Medications  . phentermine (ADIPEX-P) 37.5 MG tablet    Sig: Take 1 tablet (37.5 mg total) by mouth daily before breakfast.    Dispense:  30 tablet    Refill:  0    Order Specific Question:   Supervising Provider    Answer:   Beatrice Lecher D [[id number]]    Follow-up: Return in about 4 weeks (around 02/16/2021) for weight loss - patient has not been able to  contact [facility] for mammo. can you her?Ronnell Freshwater, NP

## 2021-01-19 NOTE — Patient Instructions (Signed)
Health Maintenance, Female Adopting a healthy lifestyle and getting preventive care are important in promoting health and wellness. Ask your health care provider about:  The right schedule for you to have regular tests and exams.  Things you can do on your own to prevent diseases and keep yourself healthy. What should I know about diet, weight, and exercise? Eat a healthy diet  Eat a diet that includes plenty of vegetables, fruits, low-fat dairy products, and lean protein.  Do not eat a lot of foods that are high in solid fats, added sugars, or sodium.   Maintain a healthy weight Body mass index (BMI) is used to identify weight problems. It estimates body fat based on height and weight. Your health care provider can help determine your BMI and help you achieve or maintain a healthy weight. Get regular exercise Get regular exercise. This is one of the most important things you can do for your health. Most adults should:  Exercise for at least 150 minutes each week. The exercise should increase your heart rate and make you sweat (moderate-intensity exercise).  Do strengthening exercises at least twice a week. This is in addition to the moderate-intensity exercise.  Spend less time sitting. Even light physical activity can be beneficial. Watch cholesterol and blood lipids Have your blood tested for lipids and cholesterol at 64 years of age, then have this test every 5 years. Have your cholesterol levels checked more often if:  Your lipid or cholesterol levels are high.  You are older than 64 years of age.  You are at high risk for heart disease. What should I know about cancer screening? Depending on your health history and family history, you may need to have cancer screening at various ages. This may include screening for:  Breast cancer.  Cervical cancer.  Colorectal cancer.  Skin cancer.  Lung cancer. What should I know about heart disease, diabetes, and high blood  pressure? Blood pressure and heart disease  High blood pressure causes heart disease and increases the risk of stroke. This is more likely to develop in people who have high blood pressure readings, are of African descent, or are overweight.  Have your blood pressure checked: ? Every 3-5 years if you are 18-39 years of age. ? Every year if you are 40 years old or older. Diabetes Have regular diabetes screenings. This checks your fasting blood sugar level. Have the screening done:  Once every three years after age 40 if you are at a normal weight and have a low risk for diabetes.  More often and at a younger age if you are overweight or have a high risk for diabetes. What should I know about preventing infection? Hepatitis B If you have a higher risk for hepatitis B, you should be screened for this virus. Talk with your health care provider to find out if you are at risk for hepatitis B infection. Hepatitis C Testing is recommended for:  Everyone born from 1945 through 1965.  Anyone with known risk factors for hepatitis C. Sexually transmitted infections (STIs)  Get screened for STIs, including gonorrhea and chlamydia, if: ? You are sexually active and are younger than 64 years of age. ? You are older than 64 years of age and your health care provider tells you that you are at risk for this type of infection. ? Your sexual activity has changed since you were last screened, and you are at increased risk for chlamydia or gonorrhea. Ask your health care provider   if you are at risk.  Ask your health care provider about whether you are at high risk for HIV. Your health care provider may recommend a prescription medicine to help prevent HIV infection. If you choose to take medicine to prevent HIV, you should first get tested for HIV. You should then be tested every 3 months for as long as you are taking the medicine. Pregnancy  If you are about to stop having your period (premenopausal) and  you may become pregnant, seek counseling before you get pregnant.  Take 400 to 800 micrograms (mcg) of folic acid every day if you become pregnant.  Ask for birth control (contraception) if you want to prevent pregnancy. Osteoporosis and menopause Osteoporosis is a disease in which the bones lose minerals and strength with aging. This can result in bone fractures. If you are 65 years old or older, or if you are at risk for osteoporosis and fractures, ask your health care provider if you should:  Be screened for bone loss.  Take a calcium or vitamin D supplement to lower your risk of fractures.  Be given hormone replacement therapy (HRT) to treat symptoms of menopause. Follow these instructions at home: Lifestyle  Do not use any products that contain nicotine or tobacco, such as cigarettes, e-cigarettes, and chewing tobacco. If you need help quitting, ask your health care provider.  Do not use street drugs.  Do not share needles.  Ask your health care provider for help if you need support or information about quitting drugs. Alcohol use  Do not drink alcohol if: ? Your health care provider tells you not to drink. ? You are pregnant, may be pregnant, or are planning to become pregnant.  If you drink alcohol: ? Limit how much you use to 0-1 drink a day. ? Limit intake if you are breastfeeding.  Be aware of how much alcohol is in your drink. In the [place] one drink equals one 12 oz bottle of beer (355 mL), one 5 oz glass of wine (148 mL), or one 1 oz glass of hard liquor (44 mL). General instructions  Schedule regular health, dental, and eye exams.  Stay current with your vaccines.  Tell your health care provider if: ? You often feel depressed. ? You have ever been abused or do not feel safe at home. Summary  Adopting a healthy lifestyle and getting preventive care are important in promoting health and wellness.  Follow your health care provider's instructions about healthy  diet, exercising, and getting tested or screened for diseases.  Follow your health care provider's instructions on monitoring your cholesterol and blood pressure. This information is not intended to replace advice given to you by your health care provider. Make sure you discuss any questions you have with your health care provider. Document Revised: 08/01/2018 Document Reviewed: 08/01/2018 Elsevier Patient Education  2021 Elsevier Inc.  

## 2021-02-08 ENCOUNTER — Telehealth: Payer: Self-pay | Admitting: Nurse Practitioner

## 2021-02-08 NOTE — Telephone Encounter (Signed)
Patient thinks she might have an ear of sinus infection. Sinus pressure, teeth hurt, patient tested for COVID last week when this started and it was negative. Please advise, thanks.

## 2021-02-10 ENCOUNTER — Encounter: Payer: Self-pay | Admitting: Nurse Practitioner

## 2021-02-10 ENCOUNTER — Ambulatory Visit (INDEPENDENT_AMBULATORY_CARE_PROVIDER_SITE_OTHER): Payer: Medicare Other | Admitting: Nurse Practitioner

## 2021-02-10 VITALS — BP 158/81 | Temp 97.7°F | Wt 161.0 lb

## 2021-02-10 DIAGNOSIS — J0140 Acute pansinusitis, unspecified: Secondary | ICD-10-CM

## 2021-02-10 DIAGNOSIS — I10 Essential (primary) hypertension: Secondary | ICD-10-CM | POA: Diagnosis not present

## 2021-02-10 MED ORDER — AMOXICILLIN-POT CLAVULANATE 875-125 MG PO TABS: 1.0000 | ORAL_TABLET | Freq: Two times a day (BID) | ORAL | 0 refills | Status: DC

## 2021-02-10 NOTE — Progress Notes (Signed)
Virtual Visit via Telephone Note  I connected with Angie [name] on 02/10/21 at  1:10 PM EDT by telephone and verified that I am speaking with the correct person using two identifiers.  Location: Patient: home  Provider: [place] [facility]     I discussed the limitations, risks, security and privacy concerns of performing an evaluation and management service by telephone and the availability of in person appointments. I also discussed with the patient that there may be a patient responsible charge related to this service. The patient expressed understanding and agreed to proceed.   History of Present Illness: The patient states that she has nasal congestion and ear congestion. Symptoms started over a week ago. She states that first few days, she did run a low grade fever. She also felt very nauseated. She did take sudafed a few times. Really helped the congestion. She states that she only takes this if really needed because she does have history of hypertension. She has been using [employer] and getting a copious amount of green mucus from each rinse. She states that she has had a mild headache off and on. She states that she has been resting. Symptoms are essentially unchanged over the past week.   Observations/Objective:  The patient is alert and oriented. She is pleasant and answers all questions appropriately. Breathing is non-labored. She is in no acute distress at this time.  The patient is nasally congested. She has loose, non productive cough which was heard during the visit.   Today's Vitals   02/10/21 1126  BP: (!) 158/81  Temp: 97.7 F (36.5 C)  Weight: 161 lb (73 kg)   Body mass index is 26.63 kg/m.   Assessment and Plan: 1. Acute non-recurrent pansinusitis Start augmentin twice daily for 7 days. Advised her to rest and increase fluids. Continue with use of Nettie pot 2 to 3 times daily.  - amoxicillin-clavulanate (AUGMENTIN) 875-125 MG tablet; Take 1  tablet by mouth 2 (two) times daily.  Dispense: 14 tablet; Refill: 0  2. Essential hypertension Generally stable. Continue medication as prescribed. Recheck at next visit 02/16/2021.   Follow Up Instructions:    I discussed the assessment and treatment plan with the patient. The patient was provided an opportunity to ask questions and all were answered. The patient agreed with the plan and demonstrated an understanding of the instructions.   The patient was advised to call back or seek an in-person evaluation if the symptoms worsen or if the condition fails to improve as anticipated.  I provided 10 minutes of non-face-to-face time during this encounter.   Ronnell Freshwater, NP

## 2021-02-16 ENCOUNTER — Ambulatory Visit: Payer: Medicare Other | Admitting: Nurse Practitioner

## 2021-02-18 DIAGNOSIS — M19031 Primary osteoarthritis, right wrist: Secondary | ICD-10-CM | POA: Diagnosis not present

## 2021-02-18 DIAGNOSIS — M19032 Primary osteoarthritis, left wrist: Secondary | ICD-10-CM | POA: Diagnosis not present

## 2021-02-24 ENCOUNTER — Other Ambulatory Visit: Payer: Self-pay

## 2021-02-24 ENCOUNTER — Ambulatory Visit (INDEPENDENT_AMBULATORY_CARE_PROVIDER_SITE_OTHER): Payer: Medicare Other | Admitting: Nurse Practitioner

## 2021-02-24 ENCOUNTER — Encounter: Payer: Self-pay | Admitting: Nurse Practitioner

## 2021-02-24 VITALS — BP 116/66 | HR 85 | Temp 98.4°F | Ht 65.5 in | Wt 162.1 lb

## 2021-02-24 DIAGNOSIS — M5442 Lumbago with sciatica, left side: Secondary | ICD-10-CM | POA: Diagnosis not present

## 2021-02-24 DIAGNOSIS — G8929 Other chronic pain: Secondary | ICD-10-CM | POA: Diagnosis not present

## 2021-02-24 DIAGNOSIS — I10 Essential (primary) hypertension: Secondary | ICD-10-CM | POA: Diagnosis not present

## 2021-02-24 DIAGNOSIS — Z1231 Encounter for screening mammogram for malignant neoplasm of breast: Secondary | ICD-10-CM

## 2021-02-24 DIAGNOSIS — Z6826 Body mass index (BMI) 26.0-26.9, adult: Secondary | ICD-10-CM

## 2021-02-24 DIAGNOSIS — M5441 Lumbago with sciatica, right side: Secondary | ICD-10-CM | POA: Diagnosis not present

## 2021-02-24 MED ORDER — PHENTERMINE HCL 37.5 MG PO TABS
37.5000 mg | ORAL_TABLET | Freq: Every day | ORAL | 0 refills | Status: DC
Start: 2021-02-24 — End: 2021-04-02

## 2021-02-24 MED ORDER — TRAMADOL HCL 50 MG PO TABS: 100.0000 mg | ORAL_TABLET | Freq: Two times a day (BID) | ORAL | 2 refills | Status: DC

## 2021-02-24 NOTE — Progress Notes (Signed)
Established Patient Office Visit  Subjective:  Patient ID: Angie [name], female    DOB: 08/04/1956  Age: 65 y.o. MRN: [id number]  CC:   HPI Angie [name] presents for routine follow-up of weight management.  She is currently taking phentermine 37.5 mg tablets daily.  She is consuming a 1200 to 1400-calorie diet per day.  She is exercising more frequently.  She has lost 3 pounds since her most recent, in office visit.  She has lost 9 pounds since starting back on phentermine 12/21/2020.  She reports no negative side effects related to taking this medication.  Her blood pressure is well managed. Patient does have chronic right hip pain after hip replacement surgery several years ago. She does take tramadol 100 mg up to twice daily when needed for moderate to severe pain.  She does need to have a refill of this today.  She has allergy to nonsteroidal anti-inflammatories.  Due to history of hepatitis C, successfully treated with Harvoni, treated with medications continue Tylenol is avoided when possible.  Reviewed her PDMP history today.  Her overdose risk score is 040.  Her fill history is appropriate. She denies current physical concerns or complaints today. She denies chest pain, chest pressure, or shortness of breath. She denies headaches or visual disturbances. She denies abdominal pain, nausea, vomiting, or changes in bowel or bladder habits.    Past Medical History:  Diagnosis Date   Arthritis    Esophagitis, reflux    Gastritis    Hepatitis C 2014   treated with Harvoni   History of colon polyps    Hypertension    Kidney stone    Melanoma ([place])    Osteoarthritis    Sepsis due to urinary tract infection ([place]) 2014   Thrombocytopenia, primary ([place])    Dr Grayland Ormond    Past Surgical History:  Procedure Laterality Date   COLONOSCOPY WITH PROPOFOL N/A 10/04/2016   Procedure: COLONOSCOPY WITH PROPOFOL;  Surgeon: Lollie Sails, MD;  Location: [facility] ENDOSCOPY;  Service: Endoscopy;   Laterality: N/A;   ESOPHAGOGASTRODUODENOSCOPY (EGD) WITH PROPOFOL N/A 05/13/2016   Procedure: ESOPHAGOGASTRODUODENOSCOPY (EGD) WITH PROPOFOL;  Surgeon: Lollie Sails, MD;  Location: [facility] ENDOSCOPY;  Service: Endoscopy;  Laterality: N/A;   ESOPHAGOGASTRODUODENOSCOPY (EGD) WITH PROPOFOL N/A 10/19/2018   Procedure: ESOPHAGOGASTRODUODENOSCOPY (EGD) WITH PROPOFOL;  Surgeon: Lollie Sails, MD;  Location: [facility] ENDOSCOPY;  Service: Endoscopy;  Laterality: N/A;   JOINT REPLACEMENT Left 2015   TONSILLECTOMY     TONSILLECTOMY     TOTAL KNEE ARTHROPLASTY      Family History  Problem Relation Age of Onset   Liver cancer Father    Cancer Father    COPD Father    Heart disease Father    COPD Mother    Hypertension Mother    Diabetes Sister    Diabetes Paternal Grandmother    Heart disease Paternal Grandmother    Hypertension Paternal Grandmother    Diabetes Paternal Grandfather    Heart disease Paternal Grandfather    Hypertension Paternal Grandfather    Breast cancer Neg Hx     Social History   Socioeconomic History   Marital status: Married    Spouse name: Yasaman Kolek   Number of children: 3   Years of education: Not on file   Highest education level: Not on file  Occupational History   Not on file  Tobacco Use   Smoking status: Former    Pack years: 0.00    Types:  Cigarettes   Smokeless tobacco: Never  Vaping Use   Vaping Use: Never used  Substance and Sexual Activity   Alcohol use: Yes    Alcohol/week: 1.0 standard drink    Types: 1 Glasses of wine per week    Comment: occasionally    Drug use: No   Sexual activity: Yes    Birth control/protection: None, Post-menopausal  Other Topics Concern   Not on file  Social History Narrative   Not on file   Social Determinants of Health   Financial Resource Strain: Not on file  Food Insecurity: Not on file  Transportation Needs: Not on file  Physical Activity: Not on file  Stress: Not on file  Social Connections:  Not on file  Intimate Partner Violence: Not on file    Outpatient Medications Prior to Visit  Medication Sig Dispense Refill   Biotin 1 MG CAPS Take 1,000 mcg by mouth daily.     cyclobenzaprine (FLEXERIL) 10 MG tablet Take 10 mg by mouth as needed for muscle spasms.     ergocalciferol (DRISDOL) 1.25 MG (50000 UT) capsule Take 1 capsule (50,000 Units total) by mouth once a week. 4 capsule 5   hydrocortisone (PROCTOSOL HC) 2.5 % rectal cream Place 1 application rectally 2 (two) times daily. 30 g 0   lisinopril-hydrochlorothiazide (ZESTORETIC) 10-12.5 MG tablet Take 1 tab po daily 30 tablet 5   pantoprazole (PROTONIX) 40 MG tablet Take 1 tablet (40 mg total) by mouth daily. 30 tablet 5   phentermine (ADIPEX-P) 37.5 MG tablet Take 1 tablet (37.5 mg total) by mouth daily before breakfast. 30 tablet 0   traMADol (ULTRAM) 50 MG tablet Take 2 tablets (100 mg total) by mouth 2 (two) times daily. 120 tablet 2   amoxicillin-clavulanate (AUGMENTIN) 875-125 MG tablet Take 1 tablet by mouth 2 (two) times daily. 14 tablet 0   hydrocortisone (ANUSOL-HC) 25 MG suppository Place 1 suppository (25 mg total) rectally 2 (two) times daily. (Patient not taking: Reported on 02/10/2021) 12 suppository 0   No facility-administered medications prior to visit.    Allergies  Allergen Reactions   Aspirin Nausea And Vomiting   Nsaids Other (See Comments)    Liver function/[place]    ROS Review of Systems  Constitutional:  Negative for chills and fever.       3 pound weight loss since her last visit.  9 pound weight loss since restarting phentermine 12/21/2020.  HENT:  Negative for congestion, postnasal drip, rhinorrhea, sinus pressure and sinus pain.   Eyes: Negative.   Respiratory:  Negative for cough, chest tightness and wheezing.   Cardiovascular:  Negative for chest pain and palpitations.  Gastrointestinal:  Negative for constipation, diarrhea, nausea and vomiting.  Endocrine: Negative for cold intolerance,  heat intolerance, polydipsia and polyuria.  Musculoskeletal:  Positive for arthralgias. Negative for back pain and myalgias.       Chronic right hip pain.  She takes tramadol 100 mg tablets up to twice daily when needed for moderate to severe pain.  Skin:  Negative for rash.  Neurological:  Negative for dizziness, weakness and headaches.  Psychiatric/Behavioral:  The patient is not nervous/anxious.      Objective:    Physical Exam Vitals and nursing note reviewed.  Constitutional:      Appearance: Normal appearance. She is well-developed.  HENT:     Head: Normocephalic and atraumatic.     Nose: Nose normal.     Mouth/Throat:     Mouth: Mucous membranes are moist.  Eyes:     Extraocular Movements: Extraocular movements intact.     Conjunctiva/sclera: Conjunctivae normal.     Pupils: Pupils are equal, round, and reactive to light.  Cardiovascular:     Rate and Rhythm: Normal rate and regular rhythm.     Pulses: Normal pulses.     Heart sounds: Normal heart sounds.  Pulmonary:     Effort: Pulmonary effort is normal.     Breath sounds: Normal breath sounds.  Abdominal:     Palpations: Abdomen is soft.  Musculoskeletal:        General: Normal range of motion.     Cervical back: Normal range of motion and neck supple.  Lymphadenopathy:     Cervical: No cervical adenopathy.  Skin:    General: Skin is warm and dry.     Capillary Refill: Capillary refill takes less than 2 seconds.  Neurological:     General: No focal deficit present.     Mental Status: She is alert and oriented to person, place, and time.  Psychiatric:        Mood and Affect: Mood normal.        Behavior: Behavior normal.        Thought Content: Thought content normal.        Judgment: Judgment normal.   Today's Vitals   02/24/21 1407  BP: 116/66  Pulse: 85  Temp: 98.4 F (36.9 C)  SpO2: 96%  Weight: 162 lb 1.6 oz (73.5 kg)   Body mass index is 26.81 kg/m.  Wt Readings from Last 3 Encounters:   02/24/21 162 lb 1.6 oz (73.5 kg)  02/10/21 161 lb (73 kg)  01/19/21 165 lb 8 oz (75.1 kg)     Health Maintenance Due  Topic Date Due   HIV Screening  Never done   TETANUS/TDAP  Never done   MAMMOGRAM  10/18/2020   COVID-19 Vaccine (4 - Booster for Pfizer series) 01/02/2021    There are no preventive care reminders to display for this patient.  Lab Results  Component Value Date   TSH 1.729 06/25/2020   Lab Results  Component Value Date   WBC 6.7 10/07/2019   HGB 14.8 10/07/2019   HCT 44.0 10/07/2019   MCV 97.1 10/07/2019   PLT 167 10/07/2019   Lab Results  Component Value Date   NA 139 10/07/2019   K 3.9 10/07/2019   CO2 21 (L) 10/07/2019   GLUCOSE 112 (H) 10/07/2019   BUN 27 (H) 10/07/2019   CREATININE 0.52 10/07/2019   BILITOT 0.4 10/07/2019   ALKPHOS 54 10/07/2019   AST 32 10/07/2019   ALT 42 10/07/2019   PROT 7.8 10/07/2019   ALBUMIN 4.3 10/07/2019   CALCIUM 9.2 10/07/2019   ANIONGAP 10 10/07/2019   Lab Results  Component Value Date   CHOL 252 (H) 10/07/2019   Lab Results  Component Value Date   HDL 87 10/07/2019   Lab Results  Component Value Date   LDLCALC 133 (H) 10/07/2019   Lab Results  Component Value Date   TRIG 162 (H) 10/07/2019   Lab Results  Component Value Date   CHOLHDL 2.9 10/07/2019   Lab Results  Component Value Date   HGBA1C 5.7 (H) 03/08/2018      Assessment & Plan:  1. Essential hypertension Blood pressure stable.  Continue medication as prescribed.  2. BMI 26.0-26.9,adult May continue phentermine 37.5 mg tablets for additional 30 days.  Continue consuming 1200 to 1500-calorie diet daily.  Encouraged her  to continue incorporating exercise into her daily routine.  We will discuss weaning off phentermine at next visit along with weight maintenance. - phentermine (ADIPEX-P) 37.5 MG tablet; Take 1 tablet (37.5 mg total) by mouth daily before breakfast.  Dispense: 30 tablet; Refill: 0  3. Chronic low back pain with  bilateral sciatica, unspecified back pain laterality May take tramadol 100 mg up to twice daily when needed for severe pain.  PDMP profile reviewed today.  Fill history appropriate.  Overdose risk score is 040.  New prescription sent to her pharmacy with 2 additional refills. - traMADol (ULTRAM) 50 MG tablet; Take 2 tablets (100 mg total) by mouth 2 (two) times daily.  Dispense: 120 tablet; Refill: 2  4. Encounter for screening mammogram for malignant neoplasm of breast Screening mammogram ordered today. - MM DIGITAL SCREENING BILATERAL; Future   Problem List Items Addressed This Visit       Cardiovascular and Mediastinum   Essential hypertension - Primary     Nervous and Auditory   Chronic low back pain with bilateral sciatica   Relevant Medications   phentermine (ADIPEX-P) 37.5 MG tablet   traMADol (ULTRAM) 50 MG tablet     Other   Encounter for screening mammogram for malignant neoplasm of breast   Relevant Orders   MM DIGITAL SCREENING BILATERAL   BMI 26.0-26.9,adult   Relevant Medications   phentermine (ADIPEX-P) 37.5 MG tablet    Meds ordered this encounter  Medications   phentermine (ADIPEX-P) 37.5 MG tablet    Sig: Take 1 tablet (37.5 mg total) by mouth daily before breakfast.    Dispense:  30 tablet    Refill:  0    Order Specific Question:   Supervising Provider    Answer:   Beatrice Lecher D [[id number]]   traMADol (ULTRAM) 50 MG tablet    Sig: Take 2 tablets (100 mg total) by mouth 2 (two) times daily.    Dispense:  120 tablet    Refill:  2    Order Specific Question:   Supervising Provider    Answer:   Beatrice Lecher D [[id number]]    Follow-up: Return in about 4 weeks (around 03/24/2021) for [place] - will need fasting blood work at time of visit .    Ronnell Freshwater, NP

## 2021-03-15 ENCOUNTER — Other Ambulatory Visit: Payer: Self-pay | Admitting: Nurse Practitioner

## 2021-03-15 DIAGNOSIS — E559 Vitamin D deficiency, unspecified: Secondary | ICD-10-CM

## 2021-04-02 ENCOUNTER — Encounter: Payer: Self-pay | Admitting: Nurse Practitioner

## 2021-04-02 ENCOUNTER — Ambulatory Visit (INDEPENDENT_AMBULATORY_CARE_PROVIDER_SITE_OTHER): Payer: Medicare Other | Admitting: Nurse Practitioner

## 2021-04-02 VITALS — BP 137/75 | HR 74 | Temp 97.6°F | Ht 65.5 in | Wt 160.4 lb

## 2021-04-02 DIAGNOSIS — I10 Essential (primary) hypertension: Secondary | ICD-10-CM | POA: Diagnosis not present

## 2021-04-02 DIAGNOSIS — Z1231 Encounter for screening mammogram for malignant neoplasm of breast: Secondary | ICD-10-CM

## 2021-04-02 DIAGNOSIS — R5383 Other fatigue: Secondary | ICD-10-CM

## 2021-04-02 DIAGNOSIS — D693 Immune thrombocytopenic purpura: Secondary | ICD-10-CM | POA: Diagnosis not present

## 2021-04-02 DIAGNOSIS — E042 Nontoxic multinodular goiter: Secondary | ICD-10-CM | POA: Diagnosis not present

## 2021-04-02 DIAGNOSIS — E559 Vitamin D deficiency, unspecified: Secondary | ICD-10-CM | POA: Diagnosis not present

## 2021-04-02 DIAGNOSIS — Z1211 Encounter for screening for malignant neoplasm of colon: Secondary | ICD-10-CM

## 2021-04-02 DIAGNOSIS — Z114 Encounter for screening for human immunodeficiency virus [HIV]: Secondary | ICD-10-CM

## 2021-04-02 DIAGNOSIS — Z0000 Encounter for general adult medical examination without abnormal findings: Secondary | ICD-10-CM

## 2021-04-02 DIAGNOSIS — Z6826 Body mass index (BMI) 26.0-26.9, adult: Secondary | ICD-10-CM

## 2021-04-02 MED ORDER — PHENTERMINE HCL 37.5 MG PO TABS: 37.5000 mg | ORAL_TABLET | Freq: Every day | ORAL | 0 refills | Status: DC

## 2021-04-02 NOTE — Progress Notes (Signed)
I connected with Angie [name] on 04/10/21 at 10:00 AM EDT by telephone and verified that I am speaking with the correct person using two identifiers.  Location: Patient: home Provider: [facility]     I discussed the limitations, risks, security and privacy concerns of performing an evaluation and management service by telephone and the availability of in person appointments. I also discussed with the patient that there may be a patient responsible charge related to this service. The patient expressed understanding and agreed to proceed.  Subjective:   Angie [name] is a 65 y.o. female who presents for Medicare Annual (Subsequent) preventive examination.  She has no current concerns or complaints today.  Blood pressure has been well managed with medications.  She is continuing to lose weight.  Has been off medications for weight management for the past few week or so.  Stop stopped when she ran out.  Lost additional 3 pounds states last visit with me.  Has no concerns or complaints regarding taking this medication. She denies chest pain, chest pressure, or shortness of breath. She denies headaches or visual disturbances. She denies abdominal pain, nausea, vomiting, or changes in bowel or bladder habits.   She is due to have routine, fasting labs.  She is due for screening mammogram.  She is also due to have referral to GI for screening colonoscopy.   Review of Systems    Review of Systems  Constitutional:  Negative for chills, diaphoresis, fever and malaise/fatigue.  HENT:  Negative for congestion, sinus pain, sore throat and tinnitus.   Eyes: Negative.   Respiratory:  Negative for cough, shortness of breath and wheezing.   Cardiovascular:  Negative for chest pain, palpitations and orthopnea.  Gastrointestinal:  Negative for abdominal pain, blood in stool, constipation, nausea and vomiting.  Genitourinary:  Negative for dysuria, flank pain, frequency and urgency.   Musculoskeletal:  Positive for back pain, joint pain and myalgias.       The patient does have chronic low back pain and hip pain.  She takes tramadol on a routine basis.  Does not need refill for this today.  Skin:  Negative for itching and rash.  Neurological:  Negative for dizziness, weakness and headaches.  Psychiatric/Behavioral:  Negative for depression. The patient is not nervous/anxious.          Objective:    Today's Vitals   04/02/21 0839  BP: 137/75  Pulse: 74  Temp: 97.6 F (36.4 C)  SpO2: 96%  Weight: 160 lb 6.4 oz (72.8 kg)  Height: 5' 5.5" (1.664 m)   Body mass index is 26.29 kg/m.   The patient is alert and oriented. She is pleasant and answers all questions appropriately. Breathing is non-labored. She is in no acute distress at this time.    Advanced Directives 10/19/2018 03/27/2018 04/10/2017 03/27/2017 07/03/2016 05/13/2016 03/17/2016  Does Patient Have a Medical Advance Directive? No No No No No No No  Would patient like information on creating a medical advance directive? - - No - Patient declined No - Patient declined No - patient declined information No - patient declined information No - patient declined information    Current Medications (verified) Outpatient Encounter Medications as of 04/02/2021  Medication Sig   Biotin 1 MG CAPS Take 1,000 mcg by mouth daily.   cyclobenzaprine (FLEXERIL) 10 MG tablet Take 10 mg by mouth as needed for muscle spasms.   hydrocortisone (PROCTOSOL HC) 2.5 % rectal cream Place 1  application rectally 2 (two) times daily.   lisinopril-hydrochlorothiazide (ZESTORETIC) 10-12.5 MG tablet Take 1 tab po daily   pantoprazole (PROTONIX) 40 MG tablet Take 1 tablet (40 mg total) by mouth daily.   traMADol (ULTRAM) 50 MG tablet Take 2 tablets (100 mg total) by mouth 2 (two) times daily.   Vitamin D, Ergocalciferol, (DRISDOL) 1.25 MG (50000 UNIT) CAPS capsule Take 1 capsule by mouth once a week   [DISCONTINUED] phentermine (ADIPEX-P) 37.5  MG tablet Take 1 tablet (37.5 mg total) by mouth daily before breakfast.   phentermine (ADIPEX-P) 37.5 MG tablet Take 1 tablet (37.5 mg total) by mouth daily before breakfast.   No facility-administered encounter medications on file as of 04/02/2021.    Allergies (verified) Aspirin and Nsaids   History: Past Medical History:  Diagnosis Date   Arthritis    Esophagitis, reflux    Gastritis    Hepatitis C 2014   treated with Harvoni   History of colon polyps    Hypertension    Kidney stone    Melanoma ([place])    Osteoarthritis    Sepsis due to urinary tract infection ([place]) 2014   Thrombocytopenia, primary ([place])    Dr Grayland Ormond   Past Surgical History:  Procedure Laterality Date   COLONOSCOPY WITH PROPOFOL N/A 10/04/2016   Procedure: COLONOSCOPY WITH PROPOFOL;  Surgeon: Lollie Sails, MD;  [facility] END[facility]COPY;  Service: Endoscopy;  Laterality: N/A;   ESOPHAGOGASTRODUODENOSCOPY (EGD) WITH PROPOFOL N/A 05/13/2016   Procedure: ESOPHAGOGASTRODUODENOSCOPY (EGD) WITH PROPOFOL;  Surgeon: Lollie Sails, MD;  Location: [facility] ENDOSCOPY;  Service: Endoscopy;  Laterality: N/A;   ESOPHAGOGASTRODUODENOSCOPY (EGD) WITH PROPOFOL N/A 10/19/2018   Procedure: ESOPHAGOGASTRODUODENOSCOPY (EGD) WITH PROPOFOL;  Surgeon: Lollie Sails, MD;  Location: [facility] ENDOSCOPY;  Service: Endoscopy;  Laterality: N/A;   JOINT REPLACEMENT Left 2015   TONSILLECTOMY     TONSILLECTOMY     TOTAL KNEE ARTHROPLASTY     Family History  Problem Relation Age of Onset   Liver cancer Father    Cancer Father    COPD Father    Heart disease Father    COPD Mother    Hypertension Mother    Diabetes Sister    Diabetes Paternal Grandmother    Heart disease Paternal Grandmother    Hypertension Paternal Grandmother    Diabetes Paternal Grandfather    Heart disease Paternal Grandfather    Hypertension Paternal Grandfather    Breast cancer Neg Hx    Social History   Socioeconomic History   Marital status:  Married    Spouse name: Christinamarie Klima   Number of children: 3   Years of education: Not on file   Highest education level: Not on file  Occupational History   Not on file  Tobacco Use   Smoking status: Former    Types: Cigarettes   Smokeless tobacco: Never  Vaping Use   Vaping Use: Never used  Substance and Sexual Activity   Alcohol use: Yes    Alcohol/week: 1.0 standard drink    Types: 1 Glasses of wine per week    Comment: occasionally    Drug use: No   Sexual activity: Yes    Birth control/protection: None, Post-menopausal  Other Topics Concern   Not on file  Social History Narrative   Not on file   Social Determinants of Health   Financial Resource Strain: Not on file  Food Insecurity: Not on file  Transportation Needs: Not on file  Physical Activity: Not on file  Stress: Not on file  Social Connections: Not on file    Tobacco Counseling Patient is former smoker     Diabetic?No      Activities of Daily Living In your present state of health, do you have any difficulty performing the following activities: 04/02/2021 01/19/2021  Hearing? N N  Vision? N N  Difficulty concentrating or making decisions? N N  Walking or climbing stairs? N N  Dressing or bathing? N N  Doing errands, shopping? N N  Some recent data might be hidden    Patient Care Team: Ronnell Freshwater, NP as PCP - General (Family Medicine)  Indicate any recent Medical Services you may have received from other than [facility] providers in the past year (date may be approximate).     Assessment:  1. Encounter for Medicare annual wellness exam Annual Medicare wellness visit today.  2. Essential hypertension Blood pressure stable.  Continue all medications as prescribed.  3. Multinodular goiter Will check thyroid panel with routine, fasting labs.  4. Autoimmune thrombocytopenia (HCC) CBC ordered with routine, fasting labs.  Will refer back to hematology for low platelet count.  5. Other  fatigue Check anemia panel with routine, fasting labs.  Treat deficiency as indicated.  6. Vitamin D deficiency Check vitamin D and treat deficiency as indicated.  7. BMI 26.0-26.9,adult Single 30-day prescription phentermine 37.5 mg tablets.  Patient to gradually wean off of this medication.  She should continue to limit her diet to 1200 to 1500 cal/day.  She should continue to incorporate low impact exercise into her daily routine. - phentermine (ADIPEX-P) 37.5 MG tablet; Take 1 tablet (37.5 mg total) by mouth daily before breakfast.  Dispense: 30 tablet; Refill: 0  8. Screening for HIV (human immunodeficiency virus) Will check HIV with routine fasting labs.  9. Encounter for screening mammogram for malignant neoplasm of breast Screening mammogram ordered today. - MM DIGITAL SCREENING BILATERAL; Future - MM DIGITAL SCREENING BILATERAL  10. Screening for colon cancer Refer to GI for screening colonoscopy.    Dietary issues and exercise activities discussed:  -limiting calorie intake to 1200-1500 calories per day -incorporating low impact exercise into her daily routine.   Depression Screen PHQ 2/9 Scores 04/02/2021 01/19/2021 12/21/2020 06/25/2020 12/03/2019 09/19/2019 08/08/2019  PHQ - 2 Score 0 0 0 0 0 0 0  PHQ- 9 Score 0 0 0 - - - -    Fall Risk Fall Risk  04/02/2021 02/10/2021 01/19/2021 12/21/2020 06/25/2020  Falls in the past year? 0 0 0 0 0  Number falls in past yr: 0 0 0 0 -  Injury with Fall? 0 0 0 0 -  Risk for fall due to : No Fall Risks - - - -  Follow up Falls evaluation completed Falls evaluation completed Falls evaluation completed Falls evaluation completed -    FALL RISK PREVENTION PERTAINING TO THE HOME:  Any stairs in or around the home? Yes  If so, are there any without handrails? Yes  Home free of loose throw rugs in walkways, pet beds, electrical cords, etc? Yes  Adequate lighting in your home to reduce risk of falls? Yes   ASSISTIVE DEVICES UTILIZED TO  PREVENT FALLS:  Life alert? No  Use of a cane, walker or w/c? No  Grab bars in the bathroom? Yes  Shower chair or bench in shower? No  Elevated toilet seat or a handicapped toilet? Yes   TIMED UP AND GO:  Was the test performed? No .  Length  of time to ambulate 10 feet:  sec.   Telehealth  Cognitive Function: MMSE - Mini Mental State Exam 09/19/2019 09/07/2018  Orientation to time 5 5  Orientation to Place 5 5  Registration 3 3  Attention/ Calculation 5 5  Recall 3 3  Language- name 2 objects 2 2  Language- repeat 1 1  Language- follow 3 step command 3 3  Language- read & follow direction 1 1  Write a sentence 1 1  Copy design 1 1  Total score 30 30     6CIT Screen 04/02/2021  What Year? 0 points  What month? 0 points  What time? 0 points  Count back from 20 0 points  Months in reverse 0 points  Repeat phrase 0 points  Total Score 0    Immunizations Immunization History  Administered Date(s) Administered   Influenza Inj Mdck Quad Pf 11/04/2019   Influenza, Quadrivalent, Recombinant, Inj, Pf 05/30/2019   Influenza,inj,Quad PF,6+ Mos 06/11/2018   PFIZER Comirnaty(Gray Top)Covid-19 Tri-Sucrose Vaccine 09/04/2020   PFIZER(Purple Top)SARS-COV-2 Vaccination 12/27/2019, 01/17/2020   Zoster Recombinat (Shingrix) 02/11/2018, 04/19/2018    TDAP status: Due, Education has been provided regarding the importance of this vaccine. Advised may receive this vaccine at local pharmacy or Health Dept. Aware to provide a copy of the vaccination record if obtained from local pharmacy or Health Dept. Verbalized acceptance and understanding.  Flu Vaccine status: Due, Education has been provided regarding the importance of this vaccine. Advised may receive this vaccine at local pharmacy or Health Dept. Aware to provide a copy of the vaccination record if obtained from local pharmacy or Health Dept. Verbalized acceptance and understanding.  Pneumococcal vaccine status: Due, Education has  been provided regarding the importance of this vaccine. Advised may receive this vaccine at local pharmacy or Health Dept. Aware to provide a copy of the vaccination record if obtained from local pharmacy or Health Dept. Verbalized acceptance and understanding.  Covid-19 vaccine status: Completed vaccines  Qualifies for Shingles Vaccine? Yes   Zostavax completed No   Shingrix Completed?: No.    Education has been provided regarding the importance of this vaccine. Patient has been advised to call insurance company to determine out of pocket expense if they have not yet received this vaccine. Advised may also receive vaccine at local pharmacy or Health Dept. Verbalized acceptance and understanding.  Screening Tests Health Maintenance  Topic Date Due   MAMMOGRAM  10/18/2020   COVID-19 Vaccine (4 - Booster for Pfizer series) 01/02/2021   INFLUENZA VACCINE  03/22/2021   Pneumococcal Vaccine 72-53 Years old (1 - PCV) 01/24/2022 (Originally 05/16/1962)   TETANUS/TDAP  04/02/2022 (Originally 05/15/1975)   PAP SMEAR-Modifier  09/07/2021   COLONOSCOPY (Pts 45-87yr Insurance coverage will need to be confirmed)  10/04/2026   HIV Screening  Completed   Zoster Vaccines- Shingrix  Completed   HPV VACCINES  Aged Out   Hepatitis C Screening  Discontinued    Health Maintenance  Health Maintenance Due  Topic Date Due   MAMMOGRAM  10/18/2020   COVID-19 Vaccine (4 - Booster for Pfizer series) 01/02/2021   INFLUENZA VACCINE  03/22/2021    Colorectal cancer screening: Type of screening: Colonoscopy. Completed 2018. Repeat every 10 years  Mammogram status: Ordered today. Pt provided with contact info and advised to call to schedule appt.     Lung Cancer Screening: (Low Dose CT Chest recommended if Age 64-80years, 30 pack-year currently smoking OR have quit w/in 15years.) does not qualify.  Lung Cancer Screening Referral:   Additional Screening:  Hepatitis C Screening: does qualify; Patient has  past history of hepatitis C. Has been successfully treated.   Vision Screening: Recommended annual ophthalmology exams for early detection of glaucoma and other disorders of the eye. Is the patient up to date with their annual eye exam?  Yes  Who is the provider or what is the name of the office in which the patient attends annual eye exams? [facility]  If pt is not established with a provider, would they like to be referred to a provider to establish care? No .   Dental Screening: Recommended annual dental exams for proper oral hygiene  Community Resource Referral / Chronic Care Management: CRR required this visit?  No   CCM required this visit?  No      Plan:     I have personally reviewed and noted the following in the patient's chart:   Medical and social history Use of alcohol, tobacco or illicit drugs  Current medications and supplements including opioid prescriptions.  Functional ability and status Nutritional status Physical activity Advanced directives List of other physicians Hospitalizations, surgeries, and ER visits in previous 12 months Vitals Screenings to include cognitive, depression, and falls Referrals and appointments  In addition, I have reviewed and discussed with patient certain preventive protocols, quality metrics, and best practice recommendations. A written personalized care plan for preventive services as well as general preventive health recommendations were provided to patient.    This note was dictated using Systems analyst. Rapid proofreading was performed to expedite the delivery of the information. Despite proofreading, phonetic errors will occur which are common with this voice recognition software. Please take this into consideration. If there are any concerns, please contact our office.    Leretha Pol, DNP, FNP-c

## 2021-04-08 ENCOUNTER — Telehealth: Payer: Self-pay | Admitting: Nurse Practitioner

## 2021-04-08 ENCOUNTER — Other Ambulatory Visit: Payer: Self-pay | Admitting: Nurse Practitioner

## 2021-04-08 DIAGNOSIS — R5383 Other fatigue: Secondary | ICD-10-CM

## 2021-04-08 DIAGNOSIS — E559 Vitamin D deficiency, unspecified: Secondary | ICD-10-CM

## 2021-04-08 DIAGNOSIS — Z0000 Encounter for general adult medical examination without abnormal findings: Secondary | ICD-10-CM

## 2021-04-08 DIAGNOSIS — I10 Essential (primary) hypertension: Secondary | ICD-10-CM

## 2021-04-08 NOTE — Telephone Encounter (Signed)
Patient is at the medical mall at [facility] in [place] to have labs and none of her lab orders are. Please advise, thanks.

## 2021-04-08 NOTE — Telephone Encounter (Signed)
I apologize. All labs entered into system now.

## 2021-04-10 DIAGNOSIS — R5383 Other fatigue: Secondary | ICD-10-CM | POA: Insufficient documentation

## 2021-04-10 DIAGNOSIS — Z1211 Encounter for screening for malignant neoplasm of colon: Secondary | ICD-10-CM | POA: Insufficient documentation

## 2021-04-10 DIAGNOSIS — Z114 Encounter for screening for human immunodeficiency virus [HIV]: Secondary | ICD-10-CM | POA: Insufficient documentation

## 2021-04-10 DIAGNOSIS — Z0000 Encounter for general adult medical examination without abnormal findings: Secondary | ICD-10-CM | POA: Insufficient documentation

## 2021-04-15 ENCOUNTER — Other Ambulatory Visit
Admission: RE | Admit: 2021-04-15 | Discharge: 2021-04-15 | Disposition: A | Discharge status: Home / Self Care | Payer: Medicare Other | Attending: Nurse Practitioner | Admitting: Nurse Practitioner

## 2021-04-15 DIAGNOSIS — R5383 Other fatigue: Secondary | ICD-10-CM | POA: Insufficient documentation

## 2021-04-15 DIAGNOSIS — I10 Essential (primary) hypertension: Secondary | ICD-10-CM | POA: Insufficient documentation

## 2021-04-15 DIAGNOSIS — D649 Anemia, unspecified: Secondary | ICD-10-CM | POA: Insufficient documentation

## 2021-04-15 DIAGNOSIS — Z0000 Encounter for general adult medical examination without abnormal findings: Secondary | ICD-10-CM | POA: Insufficient documentation

## 2021-04-15 DIAGNOSIS — E559 Vitamin D deficiency, unspecified: Secondary | ICD-10-CM | POA: Insufficient documentation

## 2021-04-15 LAB — TSH: TSH: 2.609 u[IU]/mL (ref 0.350–4.500)

## 2021-04-15 LAB — COMPREHENSIVE METABOLIC PANEL
ALT: 35 U/L (ref 0–44)
AST: 28 U/L (ref 15–41)
Albumin: 3.9 g/dL (ref 3.5–5.0)
Alkaline Phosphatase: 70 U/L (ref 38–126)
Anion gap: 10 (ref 5–15)
BUN: 18 mg/dL (ref 8–23)
CO2: 23 mmol/L (ref 22–32)
Calcium: 9.4 mg/dL (ref 8.9–10.3)
Chloride: 105 mmol/L (ref 98–111)
Creatinine, Ser: 0.8 mg/dL (ref 0.44–1.00)
GFR, Estimated: >60 mL/min (ref >60)
Glucose, Bld: 106 mg/dL — ABNORMAL HIGH (ref 70–99)
Potassium: 4.1 mmol/L (ref 3.5–5.1)
Sodium: 138 mmol/L (ref 135–145)
Total Bilirubin: 0.7 mg/dL (ref 0.3–1.2)
Total Protein: 7.5 g/dL (ref 6.5–8.1)

## 2021-04-15 LAB — CBC WITH DIFFERENTIAL/PLATELET
Abs Immature Granulocytes: 0.02 10*3/uL (ref 0.00–0.07)
Basophils Absolute: 0 10*3/uL (ref 0.0–0.1)
Basophils Relative: 1 %
Eosinophils Absolute: 0.2 10*3/uL (ref 0.0–0.5)
Eosinophils Relative: 3 %
HCT: 34.2 % — ABNORMAL LOW (ref 36.0–46.0)
Hemoglobin: 10.5 g/dL — ABNORMAL LOW (ref 12.0–15.0)
Immature Granulocytes: 0 %
Lymphocytes Relative: 35 %
Lymphs Abs: 1.7 10*3/uL (ref 0.7–4.0)
MCH: 23.5 pg — ABNORMAL LOW (ref 26.0–34.0)
MCHC: 30.7 g/dL (ref 30.0–36.0)
MCV: 76.7 fL — ABNORMAL LOW (ref 80.0–100.0)
Monocytes Absolute: 0.4 10*3/uL (ref 0.1–1.0)
Monocytes Relative: 9 %
Neutro Abs: 2.4 10*3/uL (ref 1.7–7.7)
Neutrophils Relative %: 52 %
Platelets: 211 10*3/uL (ref 150–400)
RBC: 4.46 MIL/uL (ref 3.87–5.11)
RDW: 17.8 % — ABNORMAL HIGH (ref 11.5–15.5)
WBC: 4.7 10*3/uL (ref 4.0–10.5)
nRBC: 0 % (ref 0.0–0.2)

## 2021-04-15 LAB — LIPID PANEL
Cholesterol: 231 mg/dL — ABNORMAL HIGH (ref 0–200)
HDL: 81 mg/dL (ref >40)
LDL Cholesterol: 115 mg/dL — ABNORMAL HIGH (ref 0–99)
Total CHOL/HDL Ratio: 2.9 RATIO
Triglycerides: 177 mg/dL — ABNORMAL HIGH (ref <150)
VLDL: 35 mg/dL (ref 0–40)

## 2021-04-15 LAB — T4, FREE: Free T4: 0.71 ng/dL (ref 0.61–1.12)

## 2021-04-18 ENCOUNTER — Encounter: Payer: Self-pay | Admitting: Nurse Practitioner

## 2021-04-18 NOTE — Progress Notes (Signed)
Improved lipid panel with improved CV risk. Mildly anemic. Has seen hematology in the past. MyChart message was sent to patient.

## 2021-04-19 ENCOUNTER — Other Ambulatory Visit: Payer: Self-pay | Admitting: Nurse Practitioner

## 2021-04-19 ENCOUNTER — Encounter: Payer: Self-pay | Admitting: Nurse Practitioner

## 2021-04-19 DIAGNOSIS — Z1231 Encounter for screening mammogram for malignant neoplasm of breast: Secondary | ICD-10-CM

## 2021-04-25 LAB — VITAMIN D 1,25 DIHYDROXY
Vitamin D 1, 25 (OH)2 Total: 41 pg/mL
Vitamin D2 1, 25 (OH)2: 31 pg/mL
Vitamin D3 1, 25 (OH)2: <10 pg/mL

## 2021-04-27 ENCOUNTER — Other Ambulatory Visit: Payer: Self-pay | Admitting: Nurse Practitioner

## 2021-04-27 DIAGNOSIS — D509 Iron deficiency anemia, unspecified: Secondary | ICD-10-CM

## 2021-04-27 MED ORDER — FERROUS SULFATE 324 (65 FE) MG PO TBEC: 1.0000 | DELAYED_RELEASE_TABLET | Freq: Every day | ORAL | 2 refills | Status: DC

## 2021-04-27 NOTE — Progress Notes (Signed)
Started on oral iron supplement. Vitamin d levels normal.

## 2021-04-27 NOTE — Progress Notes (Signed)
Prescription for ferrous sulfate sent to pharmacy due to low Hgb and Hct.

## 2021-05-18 ENCOUNTER — Ambulatory Visit
Admission: RE | Admit: 2021-05-18 | Discharge: 2021-05-18 | Disposition: A | Discharge status: Home / Self Care | Payer: Medicare Other | Source: Ambulatory Visit | Attending: Nurse Practitioner | Admitting: Nurse Practitioner

## 2021-05-18 ENCOUNTER — Other Ambulatory Visit: Payer: Self-pay

## 2021-05-18 DIAGNOSIS — Z1231 Encounter for screening mammogram for malignant neoplasm of breast: Secondary | ICD-10-CM | POA: Insufficient documentation

## 2021-05-22 ENCOUNTER — Other Ambulatory Visit: Payer: Self-pay | Admitting: Nurse Practitioner

## 2021-05-22 DIAGNOSIS — K219 Gastro-esophageal reflux disease without esophagitis: Secondary | ICD-10-CM

## 2021-05-31 ENCOUNTER — Encounter: Payer: Self-pay | Admitting: Nurse Practitioner

## 2021-05-31 NOTE — Progress Notes (Signed)
Negative mammogram. Mychart message sent to patient.

## 2021-06-07 ENCOUNTER — Other Ambulatory Visit: Payer: Self-pay | Admitting: Nurse Practitioner

## 2021-06-07 DIAGNOSIS — I10 Essential (primary) hypertension: Secondary | ICD-10-CM

## 2021-06-07 DIAGNOSIS — G8929 Other chronic pain: Secondary | ICD-10-CM

## 2021-06-07 NOTE — Telephone Encounter (Signed)
Reviewed PDMP history. Last fill was 03/15/2021. Refilled for 30 days. She needs to have scheduled appointment for further refills.

## 2021-06-22 ENCOUNTER — Other Ambulatory Visit: Payer: Self-pay | Admitting: Nurse Practitioner

## 2021-06-22 DIAGNOSIS — K219 Gastro-esophageal reflux disease without esophagitis: Secondary | ICD-10-CM

## 2021-07-06 DIAGNOSIS — Z800 Family history of malignant neoplasm of digestive organs: Secondary | ICD-10-CM | POA: Diagnosis not present

## 2021-07-06 DIAGNOSIS — K7460 Unspecified cirrhosis of liver: Secondary | ICD-10-CM | POA: Diagnosis not present

## 2021-07-07 ENCOUNTER — Other Ambulatory Visit: Payer: Self-pay | Admitting: Gastroenterology

## 2021-07-07 DIAGNOSIS — K7460 Unspecified cirrhosis of liver: Secondary | ICD-10-CM

## 2021-07-13 ENCOUNTER — Ambulatory Visit
Admission: RE | Admit: 2021-07-13 | Discharge: 2021-07-13 | Disposition: A | Discharge status: Home / Self Care | Payer: Medicare Other | Source: Ambulatory Visit | Attending: Gastroenterology | Admitting: Gastroenterology

## 2021-07-13 ENCOUNTER — Other Ambulatory Visit: Payer: Self-pay

## 2021-07-13 DIAGNOSIS — K7689 Other specified diseases of liver: Secondary | ICD-10-CM | POA: Diagnosis not present

## 2021-07-13 DIAGNOSIS — R160 Hepatomegaly, not elsewhere classified: Secondary | ICD-10-CM | POA: Diagnosis not present

## 2021-07-13 DIAGNOSIS — K7460 Unspecified cirrhosis of liver: Secondary | ICD-10-CM | POA: Diagnosis not present

## 2021-07-14 ENCOUNTER — Other Ambulatory Visit: Payer: Self-pay | Admitting: Gastroenterology

## 2021-07-14 DIAGNOSIS — R160 Hepatomegaly, not elsewhere classified: Secondary | ICD-10-CM

## 2021-07-14 DIAGNOSIS — K7460 Unspecified cirrhosis of liver: Secondary | ICD-10-CM

## 2021-07-17 ENCOUNTER — Other Ambulatory Visit: Payer: Self-pay | Admitting: Physician Assistant

## 2021-07-17 DIAGNOSIS — K219 Gastro-esophageal reflux disease without esophagitis: Secondary | ICD-10-CM

## 2021-07-21 ENCOUNTER — Ambulatory Visit
Admission: RE | Admit: 2021-07-21 | Discharge: 2021-07-21 | Disposition: A | Discharge status: Home / Self Care | Payer: Medicare Other | Source: Ambulatory Visit | Attending: Gastroenterology | Admitting: Gastroenterology

## 2021-07-21 ENCOUNTER — Other Ambulatory Visit: Payer: Self-pay

## 2021-07-21 DIAGNOSIS — R160 Hepatomegaly, not elsewhere classified: Secondary | ICD-10-CM | POA: Diagnosis not present

## 2021-07-21 DIAGNOSIS — K7460 Unspecified cirrhosis of liver: Secondary | ICD-10-CM

## 2021-07-21 DIAGNOSIS — K7689 Other specified diseases of liver: Secondary | ICD-10-CM | POA: Diagnosis not present

## 2021-07-21 MED ORDER — GADOBUTROL 1 MMOL/ML IV SOLN
7.0000 mL | Freq: Once | INTRAVENOUS | Status: AC | PRN
  Administered 2021-07-21: 7 mL via INTRAVENOUS

## 2021-07-28 ENCOUNTER — Other Ambulatory Visit: Payer: Self-pay | Admitting: Gastroenterology

## 2021-07-28 DIAGNOSIS — R160 Hepatomegaly, not elsewhere classified: Secondary | ICD-10-CM

## 2021-07-31 DIAGNOSIS — C778 Secondary and unspecified malignant neoplasm of lymph nodes of multiple regions: Secondary | ICD-10-CM | POA: Diagnosis not present

## 2021-08-02 NOTE — Progress Notes (Signed)
Patient called via telephone and preprocedure instructions were reviewed including NPO after midnight, responsible driver to be present for recovery/discharge. Patient verbalized understanding and did not have any questions. Patient given arrival time of 10a.m. and told start time 11a.m.

## 2021-08-04 ENCOUNTER — Other Ambulatory Visit: Payer: Self-pay | Admitting: Radiology

## 2021-08-06 ENCOUNTER — Other Ambulatory Visit: Payer: Self-pay | Admitting: Interventional Radiology

## 2021-08-06 ENCOUNTER — Other Ambulatory Visit: Payer: Self-pay

## 2021-08-06 ENCOUNTER — Ambulatory Visit
Admission: RE | Admit: 2021-08-06 | Discharge: 2021-08-06 | Disposition: A | Discharge status: Home / Self Care | Payer: Medicare Other | Source: Ambulatory Visit | Attending: Interventional Radiology | Admitting: Interventional Radiology

## 2021-08-06 ENCOUNTER — Ambulatory Visit
Admission: RE | Admit: 2021-08-06 | Discharge: 2021-08-06 | Disposition: A | Discharge status: Home / Self Care | Payer: Medicare Other | Source: Ambulatory Visit | Attending: Gastroenterology | Admitting: Gastroenterology

## 2021-08-06 DIAGNOSIS — R160 Hepatomegaly, not elsewhere classified: Secondary | ICD-10-CM

## 2021-08-06 DIAGNOSIS — I10 Essential (primary) hypertension: Secondary | ICD-10-CM | POA: Insufficient documentation

## 2021-08-06 DIAGNOSIS — K7030 Alcoholic cirrhosis of liver without ascites: Secondary | ICD-10-CM | POA: Diagnosis not present

## 2021-08-06 DIAGNOSIS — C778 Secondary and unspecified malignant neoplasm of lymph nodes of multiple regions: Secondary | ICD-10-CM | POA: Diagnosis not present

## 2021-08-06 DIAGNOSIS — Z87891 Personal history of nicotine dependence: Secondary | ICD-10-CM | POA: Diagnosis not present

## 2021-08-06 DIAGNOSIS — K7689 Other specified diseases of liver: Secondary | ICD-10-CM | POA: Diagnosis not present

## 2021-08-06 LAB — PROTIME-INR
INR: 0.9 (ref 0.8–1.2)
Prothrombin Time: 12.6 seconds (ref 11.4–15.2)

## 2021-08-06 LAB — CBC
HCT: 34.2 % — ABNORMAL LOW (ref 36.0–46.0)
Hemoglobin: 10.7 g/dL — ABNORMAL LOW (ref 12.0–15.0)
MCH: 26.2 pg (ref 26.0–34.0)
MCHC: 31.3 g/dL (ref 30.0–36.0)
MCV: 83.8 fL (ref 80.0–100.0)
Platelets: 159 10*3/uL (ref 150–400)
RBC: 4.08 MIL/uL (ref 3.87–5.11)
RDW: 15.6 % — ABNORMAL HIGH (ref 11.5–15.5)
WBC: 4.6 10*3/uL (ref 4.0–10.5)
nRBC: 0 % (ref 0.0–0.2)

## 2021-08-06 MED ORDER — MIDAZOLAM HCL 2 MG/2ML IJ SOLN
INTRAMUSCULAR | Status: AC
  Filled 2021-08-06: qty 2

## 2021-08-06 MED ORDER — MIDAZOLAM HCL 2 MG/2ML IJ SOLN
INTRAMUSCULAR | Status: DC | PRN
  Administered 2021-08-06 (×2): 1 mg via INTRAVENOUS

## 2021-08-06 MED ORDER — FENTANYL CITRATE (PF) 100 MCG/2ML IJ SOLN
INTRAMUSCULAR | Status: DC | PRN
  Administered 2021-08-06 (×2): 50 ug via INTRAVENOUS

## 2021-08-06 MED ORDER — FENTANYL CITRATE (PF) 100 MCG/2ML IJ SOLN
INTRAMUSCULAR | Status: AC
  Filled 2021-08-06: qty 2

## 2021-08-06 MED ORDER — SODIUM CHLORIDE 0.9 % IV SOLN: INTRAVENOUS | Status: DC

## 2021-08-06 NOTE — H&P (Signed)
Chief Complaint: Patient was seen in consultation today for liver lesion biopsy.  Referring Physician(s): London,Christiane Henderson [place]  Supervising Physician: Mir, Sharen Heck  Patient Status: [facility] - Out-pt  History of Present Illness: Angie [name] is a 65 y.o. female with a past medical history significant for melanoma, HTN, thrombocytopenia, hepatitis C s/p Harvoni treatment (7846) and non-alcoholic cirrhosis who presents today for a liver lesion biopsy. Angie [name] has been followed by [facility]) for cirrhosis surveillance for several years, she underwent abdominal US on 07/13/21 which noted a new 3.6 cm predominantly hypoechoic mass lesion in the posterior aspect of the right lobe of the liver concerning for [place]. She underwent MRI of the liver on 07/22/21 which noted cirrhotic liver morphology with T2 hyperintense lesion of segment 8 measuring up to 2.8 cm, findings atypical for [place]. She has been referred to IR for a liver lesion biopsy to further direct care.  Angie [name] denies any complaints today besides anxiety about the procedure, she has been feeling well overall and is ready to proceed today.  Past Medical History:  Diagnosis Date   Arthritis    Esophagitis, reflux    Gastritis    Hepatitis C 2014   treated with Harvoni   History of colon polyps    Hypertension    Kidney stone    Melanoma ([place])    Osteoarthritis    Sepsis due to urinary tract infection ([place]) 2014   Thrombocytopenia, primary ([place])    Dr Grayland Ormond    Past Surgical History:  Procedure Laterality Date   COLONOSCOPY WITH PROPOFOL N/A 10/04/2016   Procedure: COLONOSCOPY WITH PROPOFOL;  Surgeon: Lollie Sails, MD;  Location: [facility] ENDOSCOPY;  Service: Endoscopy;  Laterality: N/A;   ESOPHAGOGASTRODUODENOSCOPY (EGD) WITH PROPOFOL N/A 05/13/2016   Procedure: ESOPHAGOGASTRODUODENOSCOPY (EGD) WITH PROPOFOL;  Surgeon: Lollie Sails, MD;  Location: [facility] ENDOSCOPY;  Service: Endoscopy;  Laterality: N/A;    ESOPHAGOGASTRODUODENOSCOPY (EGD) WITH PROPOFOL N/A 10/19/2018   Procedure: ESOPHAGOGASTRODUODENOSCOPY (EGD) WITH PROPOFOL;  Surgeon: Lollie Sails, MD;  Location: [facility] ENDOSCOPY;  Service: Endoscopy;  Laterality: N/A;   JOINT REPLACEMENT Left 2015   TONSILLECTOMY     TONSILLECTOMY     TOTAL KNEE ARTHROPLASTY      Allergies: Aspirin and Nsaids  Medications: Prior to Admission medications   Medication Sig Start Date End Date Taking? Authorizing Provider  Biotin 1 MG CAPS Take 1,000 mcg by mouth daily.    [provider]  cyclobenzaprine (FLEXERIL) 10 MG tablet Take 10 mg by mouth as needed for muscle spasms.    [provider]  ferrous sulfate 324 (65 Fe) MG TBEC Take 1 tablet (325 mg total) by mouth daily. 04/27/21   Ronnell Freshwater, NP  hydrocortisone (PROCTOSOL HC) 2.5 % rectal cream Place 1 application rectally 2 (two) times daily. 10/17/17   Robert Bellow, MD  lisinopril-hydrochlorothiazide (ZESTORETIC) 10-12.5 MG tablet Take 1 tablet by mouth once daily 06/07/21   Ronnell Freshwater, NP  pantoprazole (PROTONIX) 40 MG tablet Take 1 tablet by mouth once daily 07/19/21   Ronnell Freshwater, NP  phentermine (ADIPEX-P) 37.5 MG tablet Take 1 tablet (37.5 mg total) by mouth daily before breakfast. 04/02/21   Ronnell Freshwater, NP  traMADol Veatrice Bourbon) 50 MG tablet Take 2 tablets by mouth twice daily 06/07/21   Ronnell Freshwater, NP  Vitamin D, Ergocalciferol, (DRISDOL) 1.25 MG (50000 UNIT) CAPS capsule Take 1 capsule by mouth once a week 03/15/21   Leretha Pol  E, NP     Family History  Problem Relation Age of Onset   Liver cancer Father    Cancer Father    COPD Father    Heart disease Father    COPD Mother    Hypertension Mother    Diabetes Sister    Diabetes Paternal Grandmother    Heart disease Paternal Grandmother    Hypertension Paternal Grandmother    Diabetes Paternal Grandfather    Heart disease Paternal Grandfather    Hypertension Paternal  Grandfather    Breast cancer Neg Hx     Social History   Socioeconomic History   Marital status: Married    Spouse name: Tamecca Artiga   Number of children: 3   Years of education: Not on file   Highest education level: Not on file  Occupational History   Not on file  Tobacco Use   Smoking status: Former    Types: Cigarettes   Smokeless tobacco: Never  Vaping Use   Vaping Use: Never used  Substance and Sexual Activity   Alcohol use: Yes    Alcohol/week: 1.0 standard drink    Types: 1 Glasses of wine per week    Comment: occasionally    Drug use: No   Sexual activity: Yes    Birth control/protection: None, Post-menopausal  Other Topics Concern   Not on file  Social History Narrative   Not on file   Social Determinants of Health   Financial Resource Strain: Not on file  Food Insecurity: Not on file  Transportation Needs: Not on file  Physical Activity: Not on file  Stress: Not on file  Social Connections: Not on file     Review of Systems: A 12 point ROS discussed and pertinent positives are indicated in the HPI above.  All other systems are negative.  Review of Systems  Constitutional:  Negative for chills and fever.  Respiratory:  Negative for cough and shortness of breath.   Cardiovascular:  Negative for chest pain.  Gastrointestinal:  Negative for abdominal pain, diarrhea, nausea and vomiting.  Musculoskeletal:  Negative for back pain.  Skin:  Negative for color change.  Neurological:  Negative for headaches.   Vital Signs: BP (!) 142/76    Pulse 70    Temp 97.9 F (36.6 C) (Oral)    Resp 18    Ht 5' 5.5" (1.664 m)    Wt 162 lb (73.5 kg)    SpO2 97%    BMI 26.55 kg/m   Physical Exam Vitals reviewed.  Constitutional:      General: She is not in acute distress. HENT:     Head: Normocephalic.     Mouth/Throat:     Mouth: Mucous membranes are moist.     Pharynx: Oropharynx is clear. No oropharyngeal exudate or posterior oropharyngeal erythema.  Eyes:      General: No scleral icterus. Cardiovascular:     Rate and Rhythm: Normal rate and regular rhythm.  Pulmonary:     Effort: Pulmonary effort is normal.     Breath sounds: Normal breath sounds.  Abdominal:     General: There is no distension.     Palpations: Abdomen is soft.     Tenderness: There is no abdominal tenderness.  Skin:    General: Skin is warm and dry.     Coloration: Skin is not jaundiced.  Neurological:     Mental Status: She is alert and oriented to person, place, and time.  Psychiatric:  Mood and Affect: Mood normal.        Behavior: Behavior normal.        Thought Content: Thought content normal.        Judgment: Judgment normal.     MD Evaluation Airway: WNL Heart: WNL Abdomen: WNL Chest/ Lungs: WNL ASA  Classification: 2 Mallampati/Airway Score: One   Imaging: MR ABDOMEN WWO CONTRAST  Result Date: 07/22/2021 CLINICAL DATA:  Right liver mass EXAM: MRI ABDOMEN WITHOUT AND WITH CONTRAST TECHNIQUE: Multiplanar multisequence MR imaging of the abdomen was performed both before and after the administration of intravenous contrast. CONTRAST:  67mL GADAVIST GADOBUTROL 1 MMOL/ML IV SOLN COMPARISON:  Abdominal ultrasound dated July 13, 2021 FINDINGS: Lower chest: No acute findings. Hepatobiliary: Mildly nodular liver contour. Signal dropout seen on out of phase imaging, compatible with hepatic steatosis. T2 hyperintense and T1 hypointense lesion of segment 8 of the liver which demonstrates peripheral arterial enhancement which becomes less intense on delayed imaging and no central enhancement, measures 2.8 x 2.7 cm on series 10, image 13. Pancreas: No mass, inflammatory changes, or other parenchymal abnormality identified. Spleen: Within normal limits in size and appearance. L5 sus for cy or nec [facility] Adrenals/Urinary Tract: No masses identified. No evidence of hydronephrosis. Stomach/Bowel: Visualized portions within the abdomen are unremarkable.  Vascular/Lymphatic: No pathologically enlarged lymph nodes identified. No abdominal aortic aneurysm demonstrated. Other:  None. Musculoskeletal: T2 lesions involving the approximate T10 and T12 vertebral bodies, likely hemangiomas. No aggressive appearing osseous lesions. IMPRESSION: Cirrhotic liver morphology. T2 hyperintense lesion of segment 8 of the liver measuring up to 2.8 cm with peripheral arterial enhancement, findings are atypical for [place] (LR-M), consider biopsy. Electronically Signed   By: Yetta Glassman M.D.   On: 07/22/2021 11:10   US Abdomen Complete  Result Date: 07/13/2021 CLINICAL DATA:  Cirrhosis of the liver, subsequent encounter EXAM: ABDOMEN ULTRASOUND COMPLETE COMPARISON:  10/20/2020 FINDINGS: Gallbladder: No gallstones or wall thickening visualized. No sonographic Murphy sign noted by sonographer. Common bile duct: Diameter: 3.4 mm. Liver: Diffusely increased in echogenicity. Mild nodularity is noted consistent with the given clinical history. There is a new rounded centrally hypoechoic mass lesion identified in the posterior aspect of the right lobe of liver. This measures 3.1 x 2.6 x 3.6 cm. This was not seen on the prior exam portal vein is patent on color Doppler imaging with normal direction of blood flow towards the liver. IVC: No abnormality visualized. Pancreas: Visualized portion unremarkable. Spleen: Size and appearance within normal limits. Right Kidney: Length: 10.1 cm. Echogenicity within normal limits. No mass or hydronephrosis visualized. Left Kidney: Length: 10.9 cm. Echogenicity within normal limits. No mass or hydronephrosis visualized. Abdominal aorta: No aneurysm visualized. Other findings: None. IMPRESSION: New 3.6 cm predominately hypoechoic mass lesion in the posterior aspect of the right lobe of the liver. Given the patient's underlying cirrhotic change the possibility of hepatocellular carcinoma deserves consideration. MRI of the liver with contrast is  recommended for further evaluation. These results will be called to the ordering clinician or representative by the Radiologist Assistant, and communication documented in the PACS or [employer]. Electronically Signed   By: Inez Catalina M.D.   On: 07/13/2021 20:23    Labs:  CBC: Recent Labs    04/15/21 0752  WBC 4.7  HGB 10.5*  HCT 34.2*  PLT 211    COAGS: No results for input(s): INR, APTT in the last 8760 hours.  BMP: Recent Labs    04/15/21 0752  NA 138  K 4.1  CL 105  CO2 23  GLUCOSE 106*  BUN 18  CALCIUM 9.4  CREATININE 0.80  GFRNONAA >60    LIVER FUNCTION TESTS: Recent Labs    04/15/21 0752  BILITOT 0.7  AST 28  ALT 35  ALKPHOS 70  PROT 7.5  ALBUMIN 3.9    TUMOR MARKERS: No results for input(s): AFPTM, CEA, CA199, CHROMGRNA in the last 8760 hours.  Assessment and Plan:  65 y/o F with history of Hepatitis C (s/p treatment 8676) and non-alcoholic cirrhosis found to have new liver lesion with imaging characteristics atypical for [facility] who presents today for a biopsy of this lesion to further direct care.  Risks and benefits of liver lesion biopsy was discussed with the patient and/or patient's family including, but not limited to bleeding, infection, damage to adjacent structures or low yield requiring additional tests.  All of the questions were answered and there is agreement to proceed.  Consent signed and in chart.   Thank you for this interesting consult.  I greatly enjoyed meeting Angie [name] and look forward to participating in their care.  A copy of this report was sent to the requesting provider on this date.  Electronically Signed: Joaquim Nam, PA-C 08/06/2021, 8:42 AM   I spent a total of 30 Minutes   in face to face in clinical consultation, greater than 50% of which was counseling/coordinating care for liver lesion biopsy.

## 2021-08-06 NOTE — Procedures (Signed)
Interventional Radiology Procedure Note  Procedure: US guided right liver lesion biopsy  Indication: Hep C. Right liver mass.  Findings: Please refer to procedural dictation for full description.  Complications: None  EBL: < 10 mL  Miachel Roux, MD [phone]

## 2021-08-26 DIAGNOSIS — H43813 Vitreous degeneration, bilateral: Secondary | ICD-10-CM | POA: Diagnosis not present

## 2021-09-03 ENCOUNTER — Other Ambulatory Visit: Payer: Self-pay | Admitting: Nurse Practitioner

## 2021-09-03 DIAGNOSIS — I10 Essential (primary) hypertension: Secondary | ICD-10-CM

## 2021-09-07 LAB — SURGICAL PATHOLOGY

## 2021-09-16 DIAGNOSIS — C220 Liver cell carcinoma: Secondary | ICD-10-CM | POA: Diagnosis not present

## 2021-09-16 DIAGNOSIS — K766 Portal hypertension: Secondary | ICD-10-CM | POA: Diagnosis not present

## 2021-09-16 DIAGNOSIS — K7460 Unspecified cirrhosis of liver: Secondary | ICD-10-CM | POA: Diagnosis not present

## 2021-09-17 ENCOUNTER — Encounter: Payer: Self-pay | Admitting: *Deleted

## 2021-09-20 ENCOUNTER — Encounter: Payer: Self-pay | Admitting: Gastroenterology

## 2021-09-20 ENCOUNTER — Ambulatory Visit: Payer: Medicare Other | Admitting: Certified Registered"

## 2021-09-20 ENCOUNTER — Ambulatory Visit
Admission: RE | Admit: 2021-09-20 | Discharge: 2021-09-20 | Disposition: A | Discharge status: Home / Self Care | Payer: Medicare Other | Attending: Gastroenterology | Admitting: Gastroenterology

## 2021-09-20 ENCOUNTER — Encounter
Admission: RE | Disposition: A | Discharge status: Home / Self Care | Payer: Self-pay | Source: Home / Self Care | Attending: Gastroenterology

## 2021-09-20 DIAGNOSIS — B1920 Unspecified viral hepatitis C without hepatic coma: Secondary | ICD-10-CM | POA: Diagnosis not present

## 2021-09-20 DIAGNOSIS — Z85038 Personal history of other malignant neoplasm of large intestine: Secondary | ICD-10-CM | POA: Diagnosis not present

## 2021-09-20 DIAGNOSIS — Z1211 Encounter for screening for malignant neoplasm of colon: Secondary | ICD-10-CM | POA: Insufficient documentation

## 2021-09-20 DIAGNOSIS — K7400 Hepatic fibrosis, unspecified: Secondary | ICD-10-CM | POA: Insufficient documentation

## 2021-09-20 DIAGNOSIS — K640 First degree hemorrhoids: Secondary | ICD-10-CM | POA: Diagnosis not present

## 2021-09-20 DIAGNOSIS — E119 Type 2 diabetes mellitus without complications: Secondary | ICD-10-CM | POA: Diagnosis not present

## 2021-09-20 DIAGNOSIS — Z86010 Personal history of colonic polyps: Secondary | ICD-10-CM | POA: Diagnosis not present

## 2021-09-20 DIAGNOSIS — I10 Essential (primary) hypertension: Secondary | ICD-10-CM | POA: Insufficient documentation

## 2021-09-20 DIAGNOSIS — K219 Gastro-esophageal reflux disease without esophagitis: Secondary | ICD-10-CM | POA: Insufficient documentation

## 2021-09-20 DIAGNOSIS — F17210 Nicotine dependence, cigarettes, uncomplicated: Secondary | ICD-10-CM | POA: Diagnosis not present

## 2021-09-20 DIAGNOSIS — Z800 Family history of malignant neoplasm of digestive organs: Secondary | ICD-10-CM | POA: Insufficient documentation

## 2021-09-20 DIAGNOSIS — K7460 Unspecified cirrhosis of liver: Secondary | ICD-10-CM | POA: Diagnosis not present

## 2021-09-20 DIAGNOSIS — K649 Unspecified hemorrhoids: Secondary | ICD-10-CM | POA: Diagnosis not present

## 2021-09-20 HISTORY — PX: COLONOSCOPY: SHX5424

## 2021-09-20 HISTORY — PX: ESOPHAGOGASTRODUODENOSCOPY (EGD) WITH PROPOFOL: SHX5813

## 2021-09-20 SURGERY — COLONOSCOPY
Anesthesia: General

## 2021-09-20 MED ORDER — FENTANYL CITRATE (PF) 100 MCG/2ML IJ SOLN
INTRAMUSCULAR | Status: DC | PRN
  Administered 2021-09-20: 100 ug via INTRAVENOUS

## 2021-09-20 MED ORDER — PROPOFOL 10 MG/ML IV BOLUS
INTRAVENOUS | Status: DC | PRN
Start: 2021-09-20 — End: 2021-09-20
  Administered 2021-09-20 (×3): 100 mg via INTRAVENOUS

## 2021-09-20 MED ORDER — LIDOCAINE HCL (CARDIAC) PF 100 MG/5ML IV SOSY
PREFILLED_SYRINGE | INTRAVENOUS | Status: DC | PRN
  Administered 2021-09-20: 30 mg via INTRAVENOUS

## 2021-09-20 MED ORDER — MIDAZOLAM HCL 2 MG/2ML IJ SOLN
INTRAMUSCULAR | Status: DC | PRN
  Administered 2021-09-20: 2 mg via INTRAVENOUS

## 2021-09-20 MED ORDER — MIDAZOLAM HCL 2 MG/2ML IJ SOLN
INTRAMUSCULAR | Status: AC
  Filled 2021-09-20: qty 2

## 2021-09-20 MED ORDER — FENTANYL CITRATE (PF) 100 MCG/2ML IJ SOLN
INTRAMUSCULAR | Status: AC
  Filled 2021-09-20: qty 2

## 2021-09-20 MED ORDER — SODIUM CHLORIDE 0.9 % IV SOLN: INTRAVENOUS | Status: DC

## 2021-09-20 MED ORDER — STERILE WATER FOR IRRIGATION IR SOLN
Status: DC | PRN
  Administered 2021-09-20: 180 mL

## 2021-09-20 NOTE — Interval H&P Note (Signed)
History and Physical Interval Note:  09/20/2021 9:44 AM  Angie [name]  has presented today for surgery, with the diagnosis of Cirrhosis of liver ascites, family history of colon cancer.  The various methods of treatment have been discussed with the patient and family. After consideration of risks, benefits and other options for treatment, the patient has consented to  Procedure(s): COLONOSCOPY (N/A) ESOPHAGOGASTRODUODENOSCOPY (EGD) WITH PROPOFOL (N/A) as a surgical intervention.  The patient's history has been reviewed, patient examined, no change in status, stable for surgery.  I have reviewed the patient's chart and labs.  Questions were answered to the patient's satisfaction.     Lesly Rubenstein  Ok to proceed with EGD/Colonoscopy

## 2021-09-20 NOTE — Transfer of Care (Signed)
Immediate Anesthesia Transfer of Care Note  Patient: Angie [name]  Procedure(s) Performed: COLONOSCOPY ESOPHAGOGASTRODUODENOSCOPY (EGD) WITH PROPOFOL  Patient Location: PACU and Endoscopy Unit  Anesthesia Type:MAC  Level of Consciousness: drowsy  Airway & Oxygen Therapy: Patient Spontanous Breathing  Post-op Assessment: Report given to RN  Post vital signs: Reviewed and stable  Last Vitals:  Vitals Value Taken Time  BP 92/56 09/20/21 1013  Temp 36.2 C 09/20/21 1012  Pulse 74 09/20/21 1014  Resp 16 09/20/21 1014  SpO2 95 % 09/20/21 1014  Vitals shown include unvalidated device data.  Last Pain:  Vitals:   09/20/21 1012  TempSrc: Tympanic  PainSc: Asleep         Complications: No notable events documented.

## 2021-09-20 NOTE — Op Note (Signed)
[facility] Gastroenterology Patient Name: Angie [name] Procedure Date: 09/20/2021 9:17 AM MRN: [id number] Account #: [id number] Date of Birth: 05-09-1956 Admit Type: Outpatient Age: 66 Room: [facility] ENDO ROOM 3 Gender: Female Note Status: Finalized Instrument Name: Upper Endoscope [id number] Procedure:             Upper GI endoscopy Indications:           Liver Fibrosis Providers:             Andrey Farmer MD, MD Referring MD:          No Local Md, MD (Referring MD) Medicines:             Monitored Anesthesia Care Complications:         No immediate complications. Procedure:             Pre-Anesthesia Assessment:                        - Prior to the procedure, a History and Physical was                         performed, and patient medications and allergies were                         reviewed. The patient is competent. The risks and                         benefits of the procedure and the sedation options and                         risks were discussed with the patient. All questions                         were answered and informed consent was obtained.                         Patient identification and proposed procedure were                         verified by the physician, the nurse, the                         anesthesiologist, the anesthetist and the technician                         in the endoscopy suite. Mental Status Examination:                         alert and oriented. Airway Examination: normal                         oropharyngeal airway and neck mobility. Respiratory                         Examination: clear to auscultation. CV Examination:                         normal. Prophylactic Antibiotics: The patient does not  require prophylactic antibiotics. Prior                         Anticoagulants: The patient has taken no previous                         anticoagulant or antiplatelet agents. ASA Grade                          Assessment: III - A patient with severe systemic                         disease. After reviewing the risks and benefits, the                         patient was deemed in satisfactory condition to                         undergo the procedure. The anesthesia plan was to use                         monitored anesthesia care (MAC). Immediately prior to                         administration of medications, the patient was                         re-assessed for adequacy to receive sedatives. The                         heart rate, respiratory rate, oxygen saturations,                         blood pressure, adequacy of pulmonary ventilation, and                         response to care were monitored throughout the                         procedure. The physical status of the patient was                         re-assessed after the procedure.                        After obtaining informed consent, the endoscope was                         passed under direct vision. Throughout the procedure,                         the patient's blood pressure, pulse, and oxygen                         saturations were monitored continuously. The Endoscope                         was introduced through the mouth, and advanced to the  second part of duodenum. The upper GI endoscopy was                         accomplished without difficulty. The patient tolerated                         the procedure well. Findings:      The examined esophagus was normal.      There is no endoscopic evidence of varices in the entire esophagus.      The entire examined stomach was normal.      The examined duodenum was normal. Impression:            - Normal esophagus.                        - Normal stomach.                        - Normal examined duodenum.                        - No specimens collected. Recommendation:        - Perform a colonoscopy today. Procedure Code(s):     ---  Professional ---                        [phone], Esophagogastroduodenoscopy, flexible,                         transoral; diagnostic, including collection of                         specimen(s) by brushing or washing, when performed                         (separate procedure) Diagnosis Code(s):     --- Professional ---                        K74.60, Unspecified cirrhosis of liver CPT copyright 2019 American Medical Association. All rights reserved. The codes documented in this report are preliminary and upon coder review may  be revised to meet current compliance requirements. Andrey Farmer MD, MD 09/20/2021 10:17:52 AM Number of Addenda: 0 Note Initiated On: 09/20/2021 9:17 AM Estimated Blood Loss:  Estimated blood loss: none.      [facility]

## 2021-09-20 NOTE — Op Note (Signed)
[facility] Gastroenterology Patient Name: Angie [name] Procedure Date: 09/20/2021 9:16 AM MRN: [id number] Account #: [id number] Date of Birth: 08/27/1955 Admit Type: Outpatient Age: 66 Room: [facility] ENDO ROOM 3 Gender: Female Note Status: Finalized Instrument Name: Jasper Riling [id number] Procedure:             Colonoscopy Indications:           Screening patient at increased risk: Family history of                         1st-degree relative with colorectal cancer at age 31                         years (or older) Providers:             Andrey Farmer MD, MD Referring MD:          No Local Md, MD (Referring MD) Medicines:             Monitored Anesthesia Care Complications:         No immediate complications. Procedure:             Pre-Anesthesia Assessment:                        - Prior to the procedure, a History and Physical was                         performed, and patient medications and allergies were                         reviewed. The patient is competent. The risks and                         benefits of the procedure and the sedation options and                         risks were discussed with the patient. All questions                         were answered and informed consent was obtained.                         Patient identification and proposed procedure were                         verified by the physician, the nurse, the                         anesthesiologist, the anesthetist and the technician                         in the endoscopy suite. Mental Status Examination:                         alert and oriented. Airway Examination: normal                         oropharyngeal airway and neck mobility. Respiratory  Examination: clear to auscultation. CV Examination:                         normal. Prophylactic Antibiotics: The patient does not                         require prophylactic antibiotics. Prior                          Anticoagulants: The patient has taken no previous                         anticoagulant or antiplatelet agents. ASA Grade                         Assessment: III - A patient with severe systemic                         disease. After reviewing the risks and benefits, the                         patient was deemed in satisfactory condition to                         undergo the procedure. The anesthesia plan was to use                         monitored anesthesia care (MAC). Immediately prior to                         administration of medications, the patient was                         re-assessed for adequacy to receive sedatives. The                         heart rate, respiratory rate, oxygen saturations,                         blood pressure, adequacy of pulmonary ventilation, and                         response to care were monitored throughout the                         procedure. The physical status of the patient was                         re-assessed after the procedure.                        After obtaining informed consent, the colonoscope was                         passed under direct vision. Throughout the procedure,                         the patient's blood pressure, pulse, and oxygen  saturations were monitored continuously. The                         Colonoscope was introduced through the anus and                         advanced to the the terminal ileum. The colonoscopy                         was performed without difficulty. The patient                         tolerated the procedure well. The quality of the bowel                         preparation was excellent. Findings:      The perianal and digital rectal examinations were normal.      The terminal ileum appeared normal.      Internal hemorrhoids were found during retroflexion. The hemorrhoids       were Grade I (internal hemorrhoids that do not prolapse).      The exam  was otherwise without abnormality on direct and retroflexion       views. Impression:            - The examined portion of the ileum was normal.                        - Internal hemorrhoids.                        - The examination was otherwise normal on direct and                         retroflexion views.                        - No specimens collected. Recommendation:        - Discharge patient to home.                        - Resume previous diet.                        - Continue present medications.                        - Repeat colonoscopy in 5-10 years for screening                         purposes.                        - Return to referring physician as previously                         scheduled. Procedure Code(s):     --- Professional ---                        F0263, Colorectal cancer screening; colonoscopy on  individual at high risk Diagnosis Code(s):     --- Professional ---                        Z80.0, Family history of malignant neoplasm of                         digestive organs                        K64.0, First degree hemorrhoids CPT copyright 2019 American Medical Association. All rights reserved. The codes documented in this report are preliminary and upon coder review may  be revised to meet current compliance requirements. Andrey Farmer MD, MD 09/20/2021 10:21:51 AM Number of Addenda: 0 Note Initiated On: 09/20/2021 9:16 AM Scope Withdrawal Time: 0 hours 6 minutes 15 seconds  Total Procedure Duration: 0 hours 10 minutes 4 seconds  Estimated Blood Loss:  Estimated blood loss: none.      [facility]

## 2021-09-20 NOTE — Anesthesia Postprocedure Evaluation (Signed)
Anesthesia Post Note  Patient: Angie [name]  Procedure(s) Performed: COLONOSCOPY ESOPHAGOGASTRODUODENOSCOPY (EGD) WITH PROPOFOL  Patient location during evaluation: Endoscopy Anesthesia Type: General Level of consciousness: awake and alert Pain management: pain level controlled Vital Signs Assessment: post-procedure vital signs reviewed and stable Respiratory status: spontaneous breathing, nonlabored ventilation, respiratory function stable and patient connected to nasal cannula oxygen Cardiovascular status: blood pressure returned to baseline and stable Postop Assessment: no apparent nausea or vomiting Anesthetic complications: no   No notable events documented.   Last Vitals:  Vitals:   09/20/21 1032 09/20/21 1042  BP: 109/62 (!) 103/54  Pulse: 85 76  Resp: 15 18  Temp:    SpO2: 96% 96%    Last Pain:  Vitals:   09/20/21 1042  TempSrc:   PainSc: 0-No pain                 Arita Miss

## 2021-09-20 NOTE — Anesthesia Preprocedure Evaluation (Signed)
Anesthesia Evaluation  Patient identified by MRN, date of birth, ID band Patient awake    Reviewed: Allergy & Precautions, NPO status , Patient's Chart, lab work & pertinent test results  History of Anesthesia Complications Negative for: history of anesthetic complications  Airway Mallampati: III  TM Distance: >3 FB Neck ROM: Full    Dental no notable dental hx. (+) Dental Advidsory Given, Teeth Intact   Pulmonary neg shortness of breath, neg COPD, neg recent URI, Current Smoker and Patient abstained from smoking., former smoker,    Pulmonary exam normal breath sounds clear to auscultation       Cardiovascular hypertension, (-) angina(-) CAD, (-) Past MI, (-) Cardiac Stents and (-) CABG (-) dysrhythmias (-) Valvular Problems/Murmurs Rhythm:Regular Rate:Normal - Systolic murmurs    Neuro/Psych    GI/Hepatic GERD  Medicated,(+) Hepatitis - (s/p treatment), C  Endo/Other  negative endocrine ROSDiabetes: stones.  Renal/GU Renal disease     Musculoskeletal   Abdominal   Peds  Hematology   Anesthesia Other Findings Past Medical History: No date: Arthritis No date: Esophagitis, reflux No date: Gastritis 2014: Hepatitis C     Comment:  treated with Harvoni No date: History of colon polyps No date: Hypertension No date: Kidney stone No date: Melanoma ([place]) No date: Osteoarthritis 2014: Sepsis due to urinary tract infection ([place]) No date: Thrombocytopenia, primary ([place])     Comment:  Dr Grayland Ormond   Reproductive/Obstetrics                             Anesthesia Physical  Anesthesia Plan  ASA: II  Anesthesia Plan: General   Post-op Pain Management: Minimal or no pain anticipated   Induction: Intravenous  PONV Risk Score and Plan: 2 and Propofol infusion and TIVA  Airway Management Planned: Natural Airway and Nasal Cannula  Additional Equipment: None  Intra-op Plan:    Post-operative Plan:   Informed Consent: I have reviewed the patients History and Physical, chart, labs and discussed the procedure including the risks, benefits and alternatives for the proposed anesthesia with the patient or authorized representative who has indicated his/her understanding and acceptance.     Dental advisory given  Plan Discussed with: CRNA and Surgeon  Anesthesia Plan Comments: (Discussed risks of anesthesia with patient, including possibility of difficulty with spontaneous ventilation under anesthesia necessitating airway intervention, PONV, and rare risks such as cardiac or respiratory or neurological events, and allergic reactions. Discussed the role of CRNA in patient's perioperative care. Patient understands.)        Anesthesia Quick Evaluation

## 2021-09-20 NOTE — H&P (Signed)
Outpatient short stay form Pre-procedure 09/20/2021  Lesly Rubenstein, MD  Primary Physician: Ronnell Freshwater, NP  Reason for visit:  Liver fibrosis/History of colon cancer  History of present illness:    66 y/o lady with history of hep c cirrhosis s/p SVR and recently found HCC/[place] here for variceal surveillance and colonoscopy for family history of colon cancer in her father who was holder than 44. Last colonoscopy was in 2018 with hyperplastic polyps. No significant abdominal surgeries. No blood thinners.    Current Facility-Administered Medications:    0.9 %  sodium chloride infusion, , Intravenous, Continuous, Giabella Duhart Katayama, Hilton Cork, MD  Medications Prior to Admission  Medication Sig Dispense Refill Last Dose   lisinopril-hydrochlorothiazide (ZESTORETIC) 10-12.5 MG tablet Take 1 tablet by mouth once daily 90 tablet 1 09/20/2021   Biotin 1 MG CAPS Take 1,000 mcg by mouth daily.      cyclobenzaprine (FLEXERIL) 10 MG tablet Take 10 mg by mouth as needed for muscle spasms. (Patient not taking: Reported on 08/06/2021)      ferrous sulfate 324 (65 Fe) MG TBEC Take 1 tablet (325 mg total) by mouth daily. (Patient not taking: Reported on 08/06/2021) 30 tablet 2    hydrocortisone (PROCTOSOL HC) 2.5 % rectal cream Place 1 application rectally 2 (two) times daily. (Patient not taking: Reported on 08/06/2021) 30 g 0    pantoprazole (PROTONIX) 40 MG tablet Take 1 tablet by mouth once daily 30 tablet 2    phentermine (ADIPEX-P) 37.5 MG tablet Take 1 tablet (37.5 mg total) by mouth daily before breakfast. (Patient not taking: Reported on 08/06/2021) 30 tablet 0    traMADol (ULTRAM) 50 MG tablet Take 2 tablets by mouth twice daily 120 tablet 0    Vitamin D, Ergocalciferol, (DRISDOL) 1.25 MG (50000 UNIT) CAPS capsule Take 1 capsule by mouth once a week (Patient not taking: Reported on 08/06/2021) 4 capsule 0      Allergies  Allergen Reactions   Aspirin Nausea And Vomiting   Nsaids Other  (See Comments)    Liver function/[place]     Past Medical History:  Diagnosis Date   Arthritis    Esophagitis, reflux    Gastritis    Hepatitis C 2014   treated with Harvoni   History of colon polyps    Hypertension    Kidney stone    Melanoma ([place])    Osteoarthritis    Sepsis due to urinary tract infection ([place]) 2014   Thrombocytopenia, primary ([place])    Dr Grayland Ormond    Review of systems:  Otherwise negative.    Physical Exam  Gen: Alert, oriented. Appears stated age.  HEENT: PERRLA. Lungs: No respiratory distress CV: RRR Abd: soft, benign, no masses Ext: No edema    Planned procedures: Proceed with EGD/colonoscopy. The patient understands the nature of the planned procedure, indications, risks, alternatives and potential complications including but not limited to bleeding, infection, perforation, damage to internal organs and possible oversedation/side effects from anesthesia. The patient agrees and gives consent to proceed.  Please refer to procedure notes for findings, recommendations and patient disposition/instructions.     Lesly Rubenstein, MD [facility]

## 2021-09-21 DIAGNOSIS — C221 Intrahepatic bile duct carcinoma: Secondary | ICD-10-CM | POA: Diagnosis not present

## 2021-09-21 DIAGNOSIS — C220 Liver cell carcinoma: Secondary | ICD-10-CM | POA: Diagnosis not present

## 2021-09-22 DIAGNOSIS — M75101 Unspecified rotator cuff tear or rupture of right shoulder, not specified as traumatic: Secondary | ICD-10-CM | POA: Diagnosis not present

## 2021-09-22 DIAGNOSIS — M1811 Unilateral primary osteoarthritis of first carpometacarpal joint, right hand: Secondary | ICD-10-CM | POA: Diagnosis not present

## 2021-09-28 ENCOUNTER — Other Ambulatory Visit: Payer: Self-pay | Admitting: Nurse Practitioner

## 2021-09-28 DIAGNOSIS — M5442 Lumbago with sciatica, left side: Secondary | ICD-10-CM

## 2021-09-28 DIAGNOSIS — G8929 Other chronic pain: Secondary | ICD-10-CM

## 2021-10-04 DIAGNOSIS — D649 Anemia, unspecified: Secondary | ICD-10-CM | POA: Diagnosis not present

## 2021-10-04 DIAGNOSIS — Z85820 Personal history of malignant melanoma of skin: Secondary | ICD-10-CM | POA: Diagnosis not present

## 2021-10-04 DIAGNOSIS — K567 Ileus, unspecified: Secondary | ICD-10-CM | POA: Diagnosis not present

## 2021-10-04 DIAGNOSIS — Z87891 Personal history of nicotine dependence: Secondary | ICD-10-CM | POA: Diagnosis not present

## 2021-10-04 DIAGNOSIS — Z20822 Contact with and (suspected) exposure to covid-19: Secondary | ICD-10-CM | POA: Diagnosis not present

## 2021-10-04 DIAGNOSIS — C221 Intrahepatic bile duct carcinoma: Secondary | ICD-10-CM | POA: Diagnosis not present

## 2021-10-04 DIAGNOSIS — D696 Thrombocytopenia, unspecified: Secondary | ICD-10-CM | POA: Diagnosis not present

## 2021-10-04 DIAGNOSIS — Z01818 Encounter for other preprocedural examination: Secondary | ICD-10-CM | POA: Diagnosis not present

## 2021-10-04 DIAGNOSIS — J9811 Atelectasis: Secondary | ICD-10-CM | POA: Diagnosis not present

## 2021-10-04 DIAGNOSIS — K9189 Other postprocedural complications and disorders of digestive system: Secondary | ICD-10-CM | POA: Diagnosis not present

## 2021-10-04 DIAGNOSIS — R918 Other nonspecific abnormal finding of lung field: Secondary | ICD-10-CM | POA: Diagnosis not present

## 2021-10-04 DIAGNOSIS — K7460 Unspecified cirrhosis of liver: Secondary | ICD-10-CM | POA: Diagnosis not present

## 2021-10-04 DIAGNOSIS — K219 Gastro-esophageal reflux disease without esophagitis: Secondary | ICD-10-CM | POA: Diagnosis not present

## 2021-10-04 DIAGNOSIS — Z886 Allergy status to analgesic agent status: Secondary | ICD-10-CM | POA: Diagnosis not present

## 2021-10-04 DIAGNOSIS — D630 Anemia in neoplastic disease: Secondary | ICD-10-CM | POA: Diagnosis not present

## 2021-10-04 DIAGNOSIS — Z96652 Presence of left artificial knee joint: Secondary | ICD-10-CM | POA: Diagnosis not present

## 2021-10-04 DIAGNOSIS — I10 Essential (primary) hypertension: Secondary | ICD-10-CM | POA: Diagnosis not present

## 2021-10-04 DIAGNOSIS — G8918 Other acute postprocedural pain: Secondary | ICD-10-CM | POA: Diagnosis not present

## 2021-10-04 DIAGNOSIS — C220 Liver cell carcinoma: Secondary | ICD-10-CM | POA: Diagnosis not present

## 2021-10-04 DIAGNOSIS — Z79899 Other long term (current) drug therapy: Secondary | ICD-10-CM | POA: Diagnosis not present

## 2021-10-04 DIAGNOSIS — M1990 Unspecified osteoarthritis, unspecified site: Secondary | ICD-10-CM | POA: Diagnosis not present

## 2021-10-04 DIAGNOSIS — Z4659 Encounter for fitting and adjustment of other gastrointestinal appliance and device: Secondary | ICD-10-CM | POA: Diagnosis not present

## 2021-10-07 DIAGNOSIS — K7460 Unspecified cirrhosis of liver: Secondary | ICD-10-CM | POA: Diagnosis not present

## 2021-10-07 DIAGNOSIS — C220 Liver cell carcinoma: Secondary | ICD-10-CM | POA: Diagnosis not present

## 2021-10-08 DIAGNOSIS — K567 Ileus, unspecified: Secondary | ICD-10-CM | POA: Diagnosis not present

## 2021-10-08 DIAGNOSIS — Z4659 Encounter for fitting and adjustment of other gastrointestinal appliance and device: Secondary | ICD-10-CM | POA: Diagnosis not present

## 2021-10-08 DIAGNOSIS — K9189 Other postprocedural complications and disorders of digestive system: Secondary | ICD-10-CM | POA: Diagnosis not present

## 2021-10-08 DIAGNOSIS — C220 Liver cell carcinoma: Secondary | ICD-10-CM | POA: Diagnosis not present

## 2021-10-08 DIAGNOSIS — G8918 Other acute postprocedural pain: Secondary | ICD-10-CM | POA: Diagnosis not present

## 2021-10-09 DIAGNOSIS — C220 Liver cell carcinoma: Secondary | ICD-10-CM | POA: Diagnosis not present

## 2021-10-09 DIAGNOSIS — G8918 Other acute postprocedural pain: Secondary | ICD-10-CM | POA: Diagnosis not present

## 2021-10-10 DIAGNOSIS — G8918 Other acute postprocedural pain: Secondary | ICD-10-CM | POA: Diagnosis not present

## 2021-10-11 DIAGNOSIS — G8918 Other acute postprocedural pain: Secondary | ICD-10-CM | POA: Diagnosis not present

## 2021-10-12 DIAGNOSIS — R918 Other nonspecific abnormal finding of lung field: Secondary | ICD-10-CM | POA: Diagnosis not present

## 2021-10-12 DIAGNOSIS — C220 Liver cell carcinoma: Secondary | ICD-10-CM | POA: Diagnosis not present

## 2021-10-12 DIAGNOSIS — G8918 Other acute postprocedural pain: Secondary | ICD-10-CM | POA: Diagnosis not present

## 2021-10-13 DIAGNOSIS — J9811 Atelectasis: Secondary | ICD-10-CM | POA: Diagnosis not present

## 2021-10-19 DIAGNOSIS — Z79899 Other long term (current) drug therapy: Secondary | ICD-10-CM | POA: Diagnosis not present

## 2021-10-19 DIAGNOSIS — C220 Liver cell carcinoma: Secondary | ICD-10-CM | POA: Diagnosis not present

## 2021-10-19 DIAGNOSIS — R160 Hepatomegaly, not elsewhere classified: Secondary | ICD-10-CM | POA: Diagnosis not present

## 2021-10-24 ENCOUNTER — Other Ambulatory Visit: Payer: Self-pay | Admitting: Nurse Practitioner

## 2021-10-24 DIAGNOSIS — K219 Gastro-esophageal reflux disease without esophagitis: Secondary | ICD-10-CM

## 2021-10-26 DIAGNOSIS — Z79899 Other long term (current) drug therapy: Secondary | ICD-10-CM | POA: Diagnosis not present

## 2021-10-26 DIAGNOSIS — C221 Intrahepatic bile duct carcinoma: Secondary | ICD-10-CM | POA: Diagnosis not present

## 2021-10-26 DIAGNOSIS — Z4802 Encounter for removal of sutures: Secondary | ICD-10-CM | POA: Diagnosis not present

## 2021-10-28 ENCOUNTER — Other Ambulatory Visit: Payer: Self-pay

## 2021-10-28 ENCOUNTER — Encounter: Payer: Self-pay | Admitting: Nurse Practitioner

## 2021-10-28 ENCOUNTER — Ambulatory Visit (INDEPENDENT_AMBULATORY_CARE_PROVIDER_SITE_OTHER): Payer: Medicare Other | Admitting: Nurse Practitioner

## 2021-10-28 VITALS — BP 99/60 | HR 73 | Temp 97.9°F | Ht 65.5 in | Wt 161.8 lb

## 2021-10-28 DIAGNOSIS — M5442 Lumbago with sciatica, left side: Secondary | ICD-10-CM | POA: Diagnosis not present

## 2021-10-28 DIAGNOSIS — G8929 Other chronic pain: Secondary | ICD-10-CM | POA: Diagnosis not present

## 2021-10-28 DIAGNOSIS — Z6826 Body mass index (BMI) 26.0-26.9, adult: Secondary | ICD-10-CM

## 2021-10-28 DIAGNOSIS — K219 Gastro-esophageal reflux disease without esophagitis: Secondary | ICD-10-CM | POA: Diagnosis not present

## 2021-10-28 DIAGNOSIS — I10 Essential (primary) hypertension: Secondary | ICD-10-CM

## 2021-10-28 DIAGNOSIS — M5441 Lumbago with sciatica, right side: Secondary | ICD-10-CM | POA: Diagnosis not present

## 2021-10-28 MED ORDER — PANTOPRAZOLE SODIUM 40 MG PO TBEC: 40.0000 mg | DELAYED_RELEASE_TABLET | Freq: Every day | ORAL | 1 refills | Status: DC

## 2021-10-28 MED ORDER — TRAMADOL HCL 50 MG PO TABS: 100.0000 mg | ORAL_TABLET | Freq: Two times a day (BID) | ORAL | 3 refills | Status: DC

## 2021-10-28 MED ORDER — LISINOPRIL-HYDROCHLOROTHIAZIDE 10-12.5 MG PO TABS
1.0000 | ORAL_TABLET | Freq: Every day | ORAL | 1 refills | Status: DC
Start: 2021-10-28 — End: 2022-09-13

## 2021-10-28 NOTE — Progress Notes (Unsigned)
Established patient visit   Patient: Angie [name]   DOB: 1955-10-30   66 y.o. Female  MRN: [id number] Visit Date: 10/28/2021   Chief Complaint  Patient presents with   Medication Refill   Subjective    HPI  The patient is here for follow up. She states that since her last visit, she has followed up with her GI doctor. Her ultrasound did show a new spot on her liver. MRI was concerning for cancer. This was removed and biopsied on 10/07/2021 and results were positive for two different types of cancer. They did get clean margins around the tumor. She goes to oncology next week to discuss treatment options. Treatment is prophylactic. Surgical fllow up was good. Had staples removed this past Tuesday.  -feels fatigue.  -patient has chronic back and hip pain for which she takes tramadol. Due to chronic liver disease, she can take tylenol on limited basis. She is unable to take NSAIDs at all due to allergy and ulcers. Tramadol is not for post surgical pain. She stopped taking post op pain medication after third or fourth day after hospital release.  -she has no concerns or complaints today.    Medications: Outpatient Medications Prior to Visit  Medication Sig   Biotin 1 MG CAPS Take 1,000 mcg by mouth daily.   lisinopril-hydrochlorothiazide (ZESTORETIC) 10-12.5 MG tablet Take 1 tablet by mouth once daily   pantoprazole (PROTONIX) 40 MG tablet Take 1 tablet by mouth once daily   traMADol (ULTRAM) 50 MG tablet Take 2 tablets by mouth twice daily   [DISCONTINUED] cyclobenzaprine (FLEXERIL) 10 MG tablet Take 10 mg by mouth as needed for muscle spasms. (Patient not taking: Reported on 08/06/2021)   [DISCONTINUED] ferrous sulfate 324 (65 Fe) MG TBEC Take 1 tablet (325 mg total) by mouth daily. (Patient not taking: Reported on 08/06/2021)   [DISCONTINUED] hydrocortisone (PROCTOSOL HC) 2.5 % rectal cream Place 1 application rectally 2 (two) times daily. (Patient not taking: Reported on  08/06/2021)   [DISCONTINUED] phentermine (ADIPEX-P) 37.5 MG tablet Take 1 tablet (37.5 mg total) by mouth daily before breakfast. (Patient not taking: Reported on 08/06/2021)   [DISCONTINUED] Vitamin D, Ergocalciferol, (DRISDOL) 1.25 MG (50000 UNIT) CAPS capsule Take 1 capsule by mouth once a week (Patient not taking: Reported on 08/06/2021)   No facility-administered medications prior to visit.    Review of Systems  Constitutional:  Negative for activity change, appetite change, chills, fatigue and fever.  HENT:  Negative for congestion, postnasal drip, rhinorrhea, sinus pressure, sinus pain, sneezing and sore throat.   Eyes: Negative.   Respiratory:  Negative for cough, chest tightness, shortness of breath and wheezing.   Cardiovascular:  Negative for chest pain and palpitations.  Gastrointestinal:  Negative for abdominal pain, constipation, diarrhea, nausea and vomiting.  Endocrine: Negative for cold intolerance, heat intolerance, polydipsia and polyuria.  Genitourinary:  Negative for dyspareunia, dysuria, flank pain, frequency and urgency.  Musculoskeletal:  Positive for arthralgias. Negative for back pain and myalgias.       Chronic right hip pain.  She takes tramadol 100 mg tablets up to twice daily when needed for moderate to severe pain.  Skin:  Negative for rash.  Allergic/Immunologic: Negative for environmental allergies.  Neurological:  Negative for dizziness, weakness and headaches.  Hematological:  Negative for adenopathy.  Psychiatric/Behavioral:  The patient is not nervous/anxious.    {Labs (Optional):23779}   Objective     Today's Vitals   10/28/21 0915  BP: 99/60  Pulse: 73  Temp: 97.9 F (36.6 C)  SpO2: 99%  Weight: 161 lb 12.8 oz (73.4 kg)  Height: 5' 5.5" (1.664 m)   Body mass index is 26.52 kg/m.   BP Readings from Last 3 Encounters:  10/28/21 99/60  09/20/21 (!) 103/54  08/06/21 108/61    Wt Readings from Last 3 Encounters:  10/28/21 161 lb 12.8  oz (73.4 kg)  09/20/21 165 lb (74.8 kg)  08/06/21 162 lb (73.5 kg)    Physical Exam  ***  No results found for any visits on 10/28/21.  Assessment & Plan     Problem List Items Addressed This Visit   None    No follow-ups on file.         Ronnell Freshwater, NP  [facility] [phone] (phone) [phone] (fax)  [place]

## 2021-10-31 ENCOUNTER — Other Ambulatory Visit (HOSPITAL_COMMUNITY): Payer: Self-pay | Admitting: Physician Assistant

## 2021-11-03 DIAGNOSIS — C221 Intrahepatic bile duct carcinoma: Secondary | ICD-10-CM | POA: Diagnosis not present

## 2021-11-06 NOTE — Patient Instructions (Signed)
Fat and Cholesterol Restricted Eating Plan Getting too much fat and cholesterol in your diet may cause health problems. Choosing the right foods helps keep your fat and cholesterol at normal levels. This can keep you from getting certain diseases. Your doctor may recommend an eating plan that includes: Total fat: ______% or less of total calories a day. This is ______g of fat a day. Saturated fat: ______% or less of total calories a day. This is ______g of saturated fat a day. Cholesterol: less than _________mg a day. Fiber: ______g a day. What are tips for following this plan? General tips Work with your doctor to lose weight if you need to. Avoid: Foods with added sugar. Fried foods. Foods with trans fat or partially hydrogenated oils. This includes some margarines and baked goods. If you drink alcohol: Limit how much you have to: 0-1 drink a day for women who are not pregnant. 0-2 drinks a day for men. Know how much alcohol is in a drink. In the [place] one drink equals one 12 oz bottle of beer (355 mL), one 5 oz glass of wine (148 mL), or one 1 oz glass of hard liquor (44 mL). Reading food labels Check food labels for: Trans fats. Partially hydrogenated oils. Saturated fat (g) in each serving. Cholesterol (mg) in each serving. Fiber (g) in each serving. Choose foods with healthy fats, such as: Monounsaturated fats and polyunsaturated fats. These include olive and canola oil, flaxseeds, walnuts, almonds, and seeds. Omega-3 fats. These are found in certain fish, flaxseed oil, and ground flaxseeds. Choose grain products that have whole grains. Look for the word "whole" as the first word in the ingredient list. Cooking Cook foods using low-fat methods. These include baking, boiling, grilling, and broiling. Eat more home-cooked foods. Eat at restaurants and buffets less often. Eat less fast food. Avoid cooking using saturated fats, such as butter, cream, palm oil, palm kernel oil, and  coconut oil. Meal planning  At meals, divide your plate into four equal parts: Fill one-half of your plate with vegetables, green salads, and fruit. Fill one-fourth of your plate with whole grains. Fill one-fourth of your plate with low-fat (lean) protein foods. Eat fish that is high in omega-3 fats at least two times a week. This includes mackerel, tuna, sardines, and salmon. Eat foods that are high in fiber, such as whole grains, beans, apples, pears, berries, broccoli, carrots, peas, and barley. What foods should I eat? Fruits All fresh, canned (in natural juice), or frozen fruits. Vegetables Fresh or frozen vegetables (raw, steamed, roasted, or grilled). Green salads. Grains Whole grains, such as whole wheat or whole grain breads, crackers, cereals, and pasta. Unsweetened oatmeal, bulgur, barley, quinoa, or brown rice. Corn or whole wheat flour tortillas. Meats and other protein foods Ground beef (85% or leaner), grass-fed beef, or beef trimmed of fat. Skinless chicken or turkey. Ground chicken or turkey. Pork trimmed of fat. All fish and seafood. Egg whites. Dried beans, peas, or lentils. Unsalted nuts or seeds. Unsalted canned beans. Nut butters without added sugar or oil. Dairy Low-fat or nonfat dairy products, such as skim or 1% milk, 2% or reduced-fat cheeses, low-fat and fat-free ricotta or cottage cheese, or plain low-fat and nonfat yogurt. Fats and oils Tub margarine without trans fats. Light or reduced-fat mayonnaise and salad dressings. Avocado. Olive, canola, sesame, or safflower oils. The items listed above may not be a complete list of foods and beverages you can eat. Contact a dietitian for more information. What foods   should I avoid? Fruits Canned fruit in heavy syrup. Fruit in cream or butter sauce. Fried fruit. Vegetables Vegetables cooked in cheese, cream, or butter sauce. Fried vegetables. Grains White bread. White pasta. White rice. Cornbread. Bagels, pastries,  and croissants. Crackers and snack foods that contain trans fat and hydrogenated oils. Meats and other protein foods Fatty cuts of meat. Ribs, chicken wings, bacon, sausage, bologna, salami, chitterlings, fatback, hot dogs, bratwurst, and packaged lunch meats. Liver and organ meats. Whole eggs and egg yolks. Chicken and turkey with skin. Fried meat. Dairy Whole or 2% milk, cream, half-and-half, and cream cheese. Whole milk cheeses. Whole-fat or sweetened yogurt. Full-fat cheeses. Nondairy creamers and whipped toppings. Processed cheese, cheese spreads, and cheese curds. Fats and oils Butter, stick margarine, lard, shortening, ghee, or bacon fat. Coconut, palm kernel, and palm oils. Beverages Alcohol. Sugar-sweetened drinks such as sodas, lemonade, and fruit drinks. Sweets and desserts Corn syrup, sugars, honey, and molasses. Candy. Jam and jelly. Syrup. Sweetened cereals. Cookies, pies, cakes, donuts, muffins, and ice cream. The items listed above may not be a complete list of foods and beverages you should avoid. Contact a dietitian for more information. Summary Choosing the right foods helps keep your fat and cholesterol at normal levels. This can keep you from getting certain diseases. At meals, fill one-half of your plate with vegetables, green salads, and fruits. Eat high fiber foods, like whole grains, beans, apples, pears, berries, carrots, peas, and barley. Limit added sugar, saturated fats, alcohol, and fried foods. This information is not intended to replace advice given to you by your health care provider. Make sure you discuss any questions you have with your health care provider. Document Revised: 12/18/2020 Document Reviewed: 12/18/2020 Elsevier Patient Education  2022 [employer]  

## 2021-11-09 DIAGNOSIS — C221 Intrahepatic bile duct carcinoma: Secondary | ICD-10-CM | POA: Diagnosis not present

## 2021-11-09 DIAGNOSIS — R918 Other nonspecific abnormal finding of lung field: Secondary | ICD-10-CM | POA: Diagnosis not present

## 2021-11-16 DIAGNOSIS — C220 Liver cell carcinoma: Secondary | ICD-10-CM | POA: Diagnosis not present

## 2021-11-16 DIAGNOSIS — C221 Intrahepatic bile duct carcinoma: Secondary | ICD-10-CM | POA: Diagnosis not present

## 2021-11-16 DIAGNOSIS — K7469 Other cirrhosis of liver: Secondary | ICD-10-CM | POA: Diagnosis not present

## 2021-12-01 DIAGNOSIS — C220 Liver cell carcinoma: Secondary | ICD-10-CM | POA: Diagnosis not present

## 2021-12-01 DIAGNOSIS — C221 Intrahepatic bile duct carcinoma: Secondary | ICD-10-CM | POA: Diagnosis not present

## 2021-12-20 DIAGNOSIS — C221 Intrahepatic bile duct carcinoma: Secondary | ICD-10-CM | POA: Diagnosis not present

## 2021-12-20 DIAGNOSIS — C220 Liver cell carcinoma: Secondary | ICD-10-CM | POA: Diagnosis not present

## 2021-12-20 DIAGNOSIS — K7469 Other cirrhosis of liver: Secondary | ICD-10-CM | POA: Diagnosis not present

## 2021-12-20 DIAGNOSIS — Z23 Encounter for immunization: Secondary | ICD-10-CM | POA: Diagnosis not present

## 2021-12-20 DIAGNOSIS — K7460 Unspecified cirrhosis of liver: Secondary | ICD-10-CM | POA: Diagnosis not present

## 2021-12-29 DIAGNOSIS — C7B8 Other secondary neuroendocrine tumors: Secondary | ICD-10-CM | POA: Diagnosis not present

## 2022-02-01 DIAGNOSIS — C220 Liver cell carcinoma: Secondary | ICD-10-CM | POA: Diagnosis not present

## 2022-02-01 DIAGNOSIS — C221 Intrahepatic bile duct carcinoma: Secondary | ICD-10-CM | POA: Diagnosis not present

## 2022-02-08 ENCOUNTER — Encounter: Payer: Medicare Other | Admitting: Nurse Practitioner

## 2022-02-10 ENCOUNTER — Ambulatory Visit (INDEPENDENT_AMBULATORY_CARE_PROVIDER_SITE_OTHER): Payer: Medicare Other | Admitting: Nurse Practitioner

## 2022-02-10 ENCOUNTER — Encounter: Payer: Self-pay | Admitting: Nurse Practitioner

## 2022-02-10 VITALS — BP 107/63 | HR 66 | Temp 97.5°F | Ht 65.35 in | Wt 165.5 lb

## 2022-02-10 DIAGNOSIS — E782 Mixed hyperlipidemia: Secondary | ICD-10-CM | POA: Diagnosis not present

## 2022-02-10 DIAGNOSIS — E559 Vitamin D deficiency, unspecified: Secondary | ICD-10-CM

## 2022-02-10 DIAGNOSIS — Z0000 Encounter for general adult medical examination without abnormal findings: Secondary | ICD-10-CM

## 2022-02-10 DIAGNOSIS — M5441 Lumbago with sciatica, right side: Secondary | ICD-10-CM

## 2022-02-10 DIAGNOSIS — G8929 Other chronic pain: Secondary | ICD-10-CM

## 2022-02-10 DIAGNOSIS — E042 Nontoxic multinodular goiter: Secondary | ICD-10-CM | POA: Diagnosis not present

## 2022-02-10 DIAGNOSIS — M5442 Lumbago with sciatica, left side: Secondary | ICD-10-CM | POA: Diagnosis not present

## 2022-02-10 DIAGNOSIS — I10 Essential (primary) hypertension: Secondary | ICD-10-CM

## 2022-02-10 MED ORDER — TRAMADOL HCL 50 MG PO TABS: 100.0000 mg | ORAL_TABLET | Freq: Two times a day (BID) | ORAL | 3 refills | Status: DC

## 2022-02-10 NOTE — Progress Notes (Signed)
Subjective:   Angie [name] is a 66 y.o. female who presents for Medicare Annual (Subsequent) preventive examination. -currently taking chemotherapy pills, two weeks on and one week off, for liver cancer. Did have surgery to remove cancerous part. Markers are clear. Tumor was small. Had not spread. Goes back to oncology in three months . -she does have chronic pain in the hip and lower back after having hip replacement surgery several years ago. She is unable to take acetaminophen containing products due to chronic liver issues. She is unable to take NSAIDs as she does have peptic ulcer disease. She has been taking tramadol 100 mg up to twice daily as needed for pain. A review of her PDMP profile was done today. Her overdose risk score is 140. Her fill history is appropriate with no red flags.  -she has no new concerns or complaints today. She denies chest pain, chest pressure, or shortness of breath. She denies headaches or visual disturbances. She denies abdominal pain, nausea, vomiting, or changes in bowel or bladder habits.    Review of Systems    Review of Systems  Constitutional:  Positive for malaise/fatigue. Negative for chills and fever.       Does state that she feels fatigue more quickly while on chemotherapy treatments   HENT:  Negative for congestion, sinus pain and sore throat.   Eyes: Negative.   Respiratory:  Negative for cough, shortness of breath and wheezing.   Cardiovascular:  Negative for chest pain, palpitations and leg swelling.  Gastrointestinal:  Negative for constipation, diarrhea, nausea and vomiting.  Genitourinary: Negative.   Musculoskeletal:  Positive for back pain, joint pain and myalgias.  Skin: Negative.   Neurological:  Negative for dizziness and headaches.  Endo/Heme/Allergies:  Does not bruise/bleed easily.  Psychiatric/Behavioral:  Negative for depression. The patient is not nervous/anxious.            Objective:    Today's Vitals   02/10/22  1310  BP: 107/63  Pulse: 66  Temp: (!) 97.5 F (36.4 C)  SpO2: 97%  Weight: 165 lb 8 oz (75.1 kg)  Height: 5' 5.35" (1.66 m)   Body mass index is 27.24 kg/m.     09/20/2021    9:31 AM 08/06/2021   10:12 AM 10/19/2018   12:05 PM 03/27/2018    2:48 PM 04/10/2017   11:18 AM 03/27/2017    2:46 PM 07/03/2016   12:55 PM  Advanced Directives  Does Patient Have a Medical Advance Directive? No No No No No No No  Would patient like information on creating a medical advance directive?     No - Patient declined No - Patient declined No - patient declined information    Current Medications (verified) Outpatient Encounter Medications as of 02/10/2022  Medication Sig   Biotin 1 MG CAPS Take 1,000 mcg by mouth daily.   capecitabine (XELODA) 500 MG tablet Take 1,000 mg by mouth 2 (two) times daily.   diphenoxylate-atropine (LOMOTIL) 2.5-0.025 MG tablet Take by mouth.   lisinopril-hydrochlorothiazide (ZESTORETIC) 10-12.5 MG tablet Take 1 tablet by mouth daily.   ondansetron (ZOFRAN) 8 MG tablet Take by mouth.   pantoprazole (PROTONIX) 40 MG tablet Take 1 tablet (40 mg total) by mouth daily.   prochlorperazine (COMPAZINE) 10 MG tablet Take by mouth.   PROCTO-MED HC 2.5 % rectal cream Apply topically.   [DISCONTINUED] traMADol (ULTRAM) 50 MG tablet Take 2 tablets (100 mg total) by mouth 2 (two) times daily.   traMADol Veatrice Bourbon)  50 MG tablet Take 2 tablets (100 mg total) by mouth 2 (two) times daily.   [DISCONTINUED] diphenoxylate-atropine (LOMOTIL) 2.5-0.025 MG tablet Take 1 tablet by mouth 4 (four) times daily.   [DISCONTINUED] prochlorperazine (COMPAZINE) 10 MG tablet Take 10 mg by mouth every 6 (six) hours.   No facility-administered encounter medications on file as of 02/10/2022.    Allergies (verified) Aspirin and Nsaids   History: Past Medical History:  Diagnosis Date   Arthritis    Esophagitis, reflux    Gastritis    Hepatitis C 2014   treated with Harvoni   History of colon polyps     Hypertension    Kidney stone    Melanoma ([place])    Osteoarthritis    Sepsis due to urinary tract infection ([place]) 2014   Thrombocytopenia, primary ([place])    Dr Grayland Ormond   Past Surgical History:  Procedure Laterality Date   COLONOSCOPY N/A 09/20/2021   Procedure: COLONOSCOPY;  Surgeon: Lesly Rubenstein, MD;  Location: [facility] ENDOSCOPY;  Service: Endoscopy;  Laterality: N/A;   COLONOSCOPY WITH PROPOFOL N/A 10/04/2016   Procedure: COLONOSCOPY WITH PROPOFOL;  Surgeon: Lollie Sails, MD;  Location: [facility] ENDOSCOPY;  Service: Endoscopy;  Laterality: N/A;   ESOPHAGOGASTRODUODENOSCOPY (EGD) WITH PROPOFOL N/A 05/13/2016   Procedure: ESOPHAGOGASTRODUODENOSCOPY (EGD) WITH PROPOFOL;  Surgeon: Lollie Sails, MD;  Location: [facility] ENDOSCOPY;  Service: Endoscopy;  Laterality: N/A;   ESOPHAGOGASTRODUODENOSCOPY (EGD) WITH PROPOFOL N/A 10/19/2018   Procedure: ESOPHAGOGASTRODUODENOSCOPY (EGD) WITH PROPOFOL;  Surgeon: Lollie Sails, MD;  Location: [facility] ENDOSCOPY;  Service: Endoscopy;  Laterality: N/A;   ESOPHAGOGASTRODUODENOSCOPY (EGD) WITH PROPOFOL N/A 09/20/2021   Procedure: ESOPHAGOGASTRODUODENOSCOPY (EGD) WITH PROPOFOL;  Surgeon: Lesly Rubenstein, MD;  Location: [facility] ENDOSCOPY;  Service: Endoscopy;  Laterality: N/A;   JOINT REPLACEMENT Left 2015   TONSILLECTOMY     TONSILLECTOMY     TOTAL KNEE ARTHROPLASTY     Family History  Problem Relation Age of Onset   Liver cancer Father    Cancer Father    COPD Father    Heart disease Father    COPD Mother    Hypertension Mother    Diabetes Sister    Diabetes Paternal Grandmother    Heart disease Paternal Grandmother    Hypertension Paternal Grandmother    Diabetes Paternal Grandfather    Heart disease Paternal Grandfather    Hypertension Paternal Grandfather    Breast cancer Neg Hx    Social History   Socioeconomic History   Marital status: Married    Spouse name: Keirston Saephanh   Number of children: 3   Years of education: Not on file    Highest education level: Not on file  Occupational History   Not on file  Tobacco Use   Smoking status: Former    Types: Cigarettes   Smokeless tobacco: Never  Vaping Use   Vaping Use: Never used  Substance and Sexual Activity   Alcohol use: Yes    Alcohol/week: 1.0 standard drink of alcohol    Types: 1 Glasses of wine per week    Comment: occasionally    Drug use: No   Sexual activity: Yes    Birth control/protection: None, Post-menopausal  Other Topics Concern   Not on file  Social History Narrative   Not on file   Social Determinants of Health   Financial Resource Strain: Not on file  Food Insecurity: Not on file  Transportation Needs: Not on file  Physical Activity: Not on file  Stress: Not  on file  Social Connections: Not on file    Physical Exam Vitals and nursing note reviewed.  Constitutional:      Appearance: Normal appearance. She is well-developed.  HENT:     Head: Normocephalic and atraumatic.     Right Ear: Tympanic membrane, ear canal and external ear normal.     Left Ear: Tympanic membrane, ear canal and external ear normal.     Nose: Nose normal.     Mouth/Throat:     Mouth: Mucous membranes are moist.     Pharynx: Oropharynx is clear.  Eyes:     Extraocular Movements: Extraocular movements intact.     Conjunctiva/sclera: Conjunctivae normal.     Pupils: Pupils are equal, round, and reactive to light.  Neck:     Vascular: No carotid bruit.  Cardiovascular:     Rate and Rhythm: Normal rate and regular rhythm.     Pulses: Normal pulses.     Heart sounds: Normal heart sounds.  Pulmonary:     Effort: Pulmonary effort is normal.     Breath sounds: Normal breath sounds.  Abdominal:     General: Bowel sounds are normal. There is no distension.     Palpations: Abdomen is soft. There is no mass.     Tenderness: There is no abdominal tenderness. There is no right CVA tenderness, left CVA tenderness, guarding or rebound.     Hernia: No hernia is  present.  Musculoskeletal:        General: Normal range of motion.     Cervical back: Normal range of motion and neck supple.  Lymphadenopathy:     Cervical: No cervical adenopathy.  Skin:    General: Skin is warm and dry.     Capillary Refill: Capillary refill takes less than 2 seconds.  Neurological:     General: No focal deficit present.     Mental Status: She is alert and oriented to person, place, and time.  Psychiatric:        Mood and Affect: Mood normal.        Behavior: Behavior normal.        Thought Content: Thought content normal.        Judgment: Judgment normal.      Tobacco Counseling Patient is a non smoker - quit smoking   How often do you need to have someone help you when you read instructions, pamphlets, or other written materials from your doctor or pharmacy?: (P) 1 - Never  Diabetic?no   Activities of Daily Living    02/10/2022    1:11 PM 02/09/2022    7:31 AM  In your present state of health, do you have any difficulty performing the following activities:  Hearing? 0 0  Vision? 0 0  Difficulty concentrating or making decisions? 0 0  Walking or climbing stairs? 0 0  Dressing or bathing? 0 0  Doing errands, shopping? 0 0  Preparing Food and eating ?  N  Using the Toilet?  N  In the past six months, have you accidently leaked urine?  N  Do you have problems with loss of bowel control?  N  Managing your Medications?  N  Managing your Finances?  N  Housekeeping or managing your Housekeeping?  N    Patient Care Team: Ronnell Freshwater, NP as PCP - General (Family Medicine)  Indicate any recent Medical Services you may have received from other than [facility] providers in the past year (date may be approximate).  Assessment:   1. Encounter for Medicare annual wellness exam Annual medicare wellness visit today   2. Essential hypertension Stable. Continue bp medication as prescribed   3. Mixed hyperlipidemia Check fasting lipid panel. Treat as  indicated   4. Multinodular goiter Check thyroid panel. Treat deficiency as indicated  - TSH; Future - T4, free; Future - T4, free - TSH  5. Vitamin D deficiency Check vitamin d level and treat deficiency as indicated   6. Chronic low back pain with bilateral sciatica, unspecified back pain laterality May take tramadol 100 mg up to twice daily as needed for pain. A controlled substances agreement was signed today 02/10/2022. PDMP profile reviewed today with no red flags or state indicators. New prescription sent to her pharmacy today.  - traMADol (ULTRAM) 50 MG tablet; Take 2 tablets (100 mg total) by mouth 2 (two) times daily.  Dispense: 120 tablet; Refill: 3  7. Healthcare maintenance Routine, fasting labs ordered during today's visit.    Hearing/Vision screen No results found.   Depression Screen    02/10/2022    1:10 PM 10/28/2021   11:42 AM 04/02/2021    8:40 AM 01/19/2021    1:39 PM 12/21/2020    1:21 PM 06/25/2020    3:03 PM 12/03/2019    2:22 PM  PHQ 2/9 Scores  PHQ - 2 Score 0 0 0 0 0 0 0  PHQ- 9 Score 1 0 0 0 0      Fall Risk    02/09/2022    7:31 AM 04/02/2021    8:40 AM 02/10/2021   11:33 AM 01/19/2021    1:39 PM 12/21/2020    1:21 PM  [place] in the past year? 0 0 0 0 0  Number falls in past yr: 0 0 0 0 0  Injury with Fall? 0 0 0 0 0  Risk for fall due to :  No Fall Risks     Follow up  Falls evaluation completed Falls evaluation completed Falls evaluation completed Falls evaluation completed    [place]:  Any stairs in or around the home? Yes  If so, are there any without handrails? Yes  Home free of loose throw rugs in walkways, pet beds, electrical cords, etc? Yes  Adequate lighting in your home to reduce risk of falls? Yes   ASSISTIVE DEVICES UTILIZED TO PREVENT FALLS:  Life alert? No  Use of a cane, walker or w/c? No  Grab bars in the bathroom? Yes  Shower chair or bench in shower? Yes  Elevated toilet  seat or a handicapped toilet? Yes   TIMED UP AND GO:  Was the test performed? Yes .  Length of time to ambulate 10 feet: 10 sec.   Gait steady and fast without use of assistive device  Cognitive Function:    09/19/2019    3:04 PM 09/07/2018    2:19 PM  MMSE - Mini Mental State Exam  Orientation to time 5 5  Orientation to Place 5 5  Registration 3 3  Attention/ Calculation 5 5  Recall 3 3  Language- name 2 objects 2 2  Language- repeat 1 1  Language- follow 3 step command 3 3  Language- read & follow direction 1 1  Write a sentence 1 1  Copy design 1 1  Total score 30 30        02/10/2022    1:13 PM 04/02/2021    8:41 AM  6CIT Screen  What Year? 0 points 0 points  What month?  0 points  What time? 0 points 0 points  Count back from 20 0 points 0 points  Months in reverse 0 points 0 points  Repeat phrase 0 points 0 points  Total Score  0 points    Immunizations Immunization History  Administered Date(s) Administered   Hepatitis B, adult 12/20/2021   Influenza Inj Mdck Quad Pf 11/04/2019   Influenza, Quadrivalent, Recombinant, Inj, Pf 05/30/2019   Influenza,inj,Quad PF,6+ Mos 06/11/2018   Influenza-Unspecified 11/04/2019, 06/25/2021   PFIZER Comirnaty(Gray Top)Covid-19 Tri-Sucrose Vaccine 12/27/2019, 01/17/2020, 09/04/2020   PFIZER(Purple Top)SARS-COV-2 Vaccination 12/27/2019, 01/17/2020   Zoster Recombinat (Shingrix) 02/11/2018, 04/19/2018    TDAP status: Due, Education has been provided regarding the importance of this vaccine. Advised may receive this vaccine at local pharmacy or Health Dept. Aware to provide a copy of the vaccination record if obtained from local pharmacy or Health Dept. Verbalized acceptance and understanding.  Flu Vaccine status: Up to date  Pneumococcal vaccine status: Due, Education has been provided regarding the importance of this vaccine. Advised may receive this vaccine at local pharmacy or Health Dept. Aware to provide a copy of the  vaccination record if obtained from local pharmacy or Health Dept. Verbalized acceptance and understanding.  Covid-19 vaccine status: Completed vaccines  Qualifies for Shingles Vaccine? Yes   Zostavax completed No   Shingrix Completed?: No.    Education has been provided regarding the importance of this vaccine. Patient has been advised to call insurance company to determine out of pocket expense if they have not yet received this vaccine. Advised may also receive vaccine at local pharmacy or Health Dept. Verbalized acceptance and understanding.  Screening Tests Health Maintenance  Topic Date Due   COVID-19 Vaccine (6 - Booster for Pfizer series) 10/30/2020   PAP SMEAR-Modifier  09/07/2021   TETANUS/TDAP  04/02/2022 (Originally 05/15/1975)   INFLUENZA VACCINE  03/22/2022   MAMMOGRAM  05/19/2023   COLONOSCOPY (Pts 45-61yr Insurance coverage will need to be confirmed)  09/21/2031   HIV Screening  Completed   Zoster Vaccines- Shingrix  Completed   HPV VACCINES  Aged Out   Hepatitis C Screening  Discontinued    Health Maintenance  Health Maintenance Due  Topic Date Due   COVID-19 Vaccine (6 - Booster for [employer]) 10/30/2020   PAP SMEAR-Modifier  09/07/2021    Colorectal cancer screening: Type of screening: Colonoscopy. Completed 09/20/2021. Repeat every 5 years  Mammogram status: Completed 05/18/2021..Marland KitchenRepeat every year   Lung Cancer Screening: (Low Dose CT Chest recommended if Age 65-80years, 30 pack-year currently smoking OR have quit w/in 15years.) does not qualify.   Lung Cancer Screening Referral: no  Additional Screening:  Hepatitis C Screening: does not qualify; Completed 2014  Vision Screening: Recommended annual ophthalmology exams for early detection of glaucoma and other disorders of the eye. Is the patient up to date with their annual eye exam?  Yes  Who is the provider or what is the name of the office in which the patient attends annual eye exams?  [facility] provider, would they like to be referred to a provider to establish care? No .   Dental Screening: Recommended annual dental exams for proper oral hygiene  Community Resource Referral / Chronic Care Management: CRR required this visit?  No   CCM required this visit?  No      Plan:  I have personally reviewed and noted the following in the patient's chart:   Medical and social history Use of alcohol, tobacco or illicit drugs  Current medications and supplements including opioid prescriptions.  Functional ability and status Nutritional status Physical activity Advanced directives List of other physicians Hospitalizations, surgeries, and ER visits in previous 12 months Vitals Screenings to include cognitive, depression, and falls Referrals and appointments  In addition, I have reviewed and discussed with patient certain preventive protocols, quality metrics, and best practice recommendations. A written personalized care plan for preventive services as well as general preventive health recommendations were provided to patient.     Leretha Pol,  FNP-c  02/19/2022

## 2022-02-19 DIAGNOSIS — E782 Mixed hyperlipidemia: Secondary | ICD-10-CM | POA: Insufficient documentation

## 2022-03-15 DIAGNOSIS — M1811 Unilateral primary osteoarthritis of first carpometacarpal joint, right hand: Secondary | ICD-10-CM | POA: Diagnosis not present

## 2022-05-03 DIAGNOSIS — Z79899 Other long term (current) drug therapy: Secondary | ICD-10-CM | POA: Diagnosis not present

## 2022-05-03 DIAGNOSIS — Z87891 Personal history of nicotine dependence: Secondary | ICD-10-CM | POA: Diagnosis not present

## 2022-05-03 DIAGNOSIS — K7460 Unspecified cirrhosis of liver: Secondary | ICD-10-CM | POA: Diagnosis not present

## 2022-05-03 DIAGNOSIS — Z8505 Personal history of malignant neoplasm of liver: Secondary | ICD-10-CM | POA: Diagnosis not present

## 2022-05-03 DIAGNOSIS — C220 Liver cell carcinoma: Secondary | ICD-10-CM | POA: Diagnosis not present

## 2022-05-03 DIAGNOSIS — Z96652 Presence of left artificial knee joint: Secondary | ICD-10-CM | POA: Diagnosis not present

## 2022-05-03 DIAGNOSIS — Z08 Encounter for follow-up examination after completed treatment for malignant neoplasm: Secondary | ICD-10-CM | POA: Diagnosis not present

## 2022-05-03 DIAGNOSIS — C221 Intrahepatic bile duct carcinoma: Secondary | ICD-10-CM | POA: Diagnosis not present

## 2022-05-11 ENCOUNTER — Other Ambulatory Visit
Admission: RE | Admit: 2022-05-11 | Discharge: 2022-05-11 | Disposition: A | Discharge status: Home / Self Care | Payer: Medicare Other | Attending: Nurse Practitioner | Admitting: Nurse Practitioner

## 2022-05-11 DIAGNOSIS — E042 Nontoxic multinodular goiter: Secondary | ICD-10-CM | POA: Insufficient documentation

## 2022-05-11 LAB — TSH: TSH: 0.847 u[IU]/mL (ref 0.350–4.500)

## 2022-05-11 LAB — T4, FREE: Free T4: 0.86 ng/dL (ref 0.61–1.12)

## 2022-05-16 ENCOUNTER — Other Ambulatory Visit: Payer: Self-pay | Admitting: Nurse Practitioner

## 2022-05-16 ENCOUNTER — Telehealth: Payer: Self-pay

## 2022-05-16 DIAGNOSIS — U071 COVID-19: Secondary | ICD-10-CM

## 2022-05-16 MED ORDER — MOLNUPIRAVIR EUA 200MG CAPSULE: 4.0000 | ORAL_CAPSULE | Freq: Two times a day (BID) | ORAL | 0 refills | Status: AC

## 2022-05-16 NOTE — Progress Notes (Signed)
Patient COVID 19 positive. Sent mulnipavir to [facility]. Take 4 pills twice daily for 5 days. She should rest, increase fluids, and increase intake of vitamin C, Vitamin /d, and zinc. She should quarantine for 5 days. If symptoms are better, after 5th day, she can go back into public. She should wear a mask for first 5 days after quarantine.

## 2022-05-16 NOTE — Telephone Encounter (Signed)
Please let the patient know that I sent mulnipavir to [place]. Take 4 pills twice daily for 5 days. She should rest, increase fluids, and increase intake of vitamin C, Vitamin /d, and zinc. She should quarantine for 5 days. If symptoms are better, after 5th day, she can go back into public. She should wear a mask for first 5 days after quarantine.  Thanks so much.   -HB

## 2022-05-16 NOTE — Telephone Encounter (Signed)
Patient called to inform that she has taken 2 home test, patient states test were cv19 positive. Patient would like to know her next steps, and if she could get some medication, please advise, thanks!

## 2022-05-16 NOTE — Progress Notes (Signed)
Thyroid panel normal.

## 2022-05-24 DIAGNOSIS — K7460 Unspecified cirrhosis of liver: Secondary | ICD-10-CM | POA: Diagnosis not present

## 2022-05-24 DIAGNOSIS — Z23 Encounter for immunization: Secondary | ICD-10-CM | POA: Diagnosis not present

## 2022-05-24 DIAGNOSIS — C220 Liver cell carcinoma: Secondary | ICD-10-CM | POA: Diagnosis not present

## 2022-06-12 NOTE — Progress Notes (Signed)
Established patient visit   Patient: Angie [name]   DOB: [birth year]   66 y.o. Female  MRN: [id number] Visit Date: 06/13/2022   Chief Complaint  Patient presents with   Gynecologic Exam   Subjective    HPI  Follow up -needs to have pap -hypertension  - takes tramadol for chronic hip and low back pain.  -PDMP profile reviewed 06/12/2022 --overdose risk score is 240 with no red flags or state  indicators -need for bone density - schedule along with next mammogram    Medications: Outpatient Medications Prior to Visit  Medication Sig   Biotin 1 MG CAPS Take 1,000 mcg by mouth daily.   lisinopril-hydrochlorothiazide (ZESTORETIC) 10-12.5 MG tablet Take 1 tablet by mouth daily.   ondansetron (ZOFRAN) 8 MG tablet Take by mouth.   pantoprazole (PROTONIX) 40 MG tablet Take 1 tablet (40 mg total) by mouth daily.   prochlorperazine (COMPAZINE) 10 MG tablet Take by mouth.   PROCTO-MED HC 2.5 % rectal cream Apply topically.   [DISCONTINUED] traMADol (ULTRAM) 50 MG tablet Take 2 tablets (100 mg total) by mouth 2 (two) times daily.   [DISCONTINUED] capecitabine (XELODA) 500 MG tablet Take 1,000 mg by mouth 2 (two) times daily.   No facility-administered medications prior to visit.    Review of Systems  Constitutional:  Negative for activity change, appetite change, chills, fatigue and fever.       Ten pound weight gain since most recent visit. Has just finished chemotherapy for liver cancer. Doing well.   HENT:  Negative for congestion, postnasal drip, rhinorrhea, sinus pressure, sinus pain, sneezing and sore throat.   Eyes: Negative.   Respiratory:  Negative for cough, chest tightness, shortness of breath and wheezing.   Cardiovascular:  Negative for chest pain and palpitations.  Gastrointestinal:  Negative for abdominal pain, constipation, diarrhea, nausea and vomiting.  Endocrine: Negative for cold intolerance, heat intolerance, polydipsia and polyuria.  Genitourinary:  Negative  for dyspareunia, dysuria, flank pain, frequency and urgency.  Musculoskeletal:  Positive for arthralgias, back pain, joint swelling, myalgias and neck pain.  Skin:  Negative for rash.  Allergic/Immunologic: Negative for environmental allergies.  Neurological:  Negative for dizziness, weakness and headaches.  Hematological:  Negative for adenopathy.  Psychiatric/Behavioral:  The patient is not nervous/anxious.       Objective     Today's Vitals   06/13/22 1331  BP: 111/68  Pulse: 75  SpO2: 97%  Weight: 178 lb 12.8 oz (81.1 kg)  Height: 5' 5.35" (1.66 m)   Body mass index is 29.44 kg/m.  BP Readings from Last 3 Encounters:  06/13/22 111/68  02/10/22 107/63  10/28/21 99/60    Wt Readings from Last 3 Encounters:  06/13/22 178 lb 12.8 oz (81.1 kg)  02/10/22 165 lb 8 oz (75.1 kg)  10/28/21 161 lb 12.8 oz (73.4 kg)    Physical Exam Vitals and nursing note reviewed. Exam conducted with a chaperone present.  Constitutional:      Appearance: Normal appearance. She is well-developed.  HENT:     Head: Normocephalic and atraumatic.  Eyes:     Pupils: Pupils are equal, round, and reactive to light.  Cardiovascular:     Rate and Rhythm: Normal rate and regular rhythm.     Pulses: Normal pulses.     Heart sounds: Normal heart sounds.  Pulmonary:     Effort: Pulmonary effort is normal.     Breath sounds: Normal breath sounds.  Abdominal:     Palpations: Abdomen  is soft.     Hernia: There is no hernia in the left inguinal area or right inguinal area.  Genitourinary:    General: Normal vulva.     Exam position: Supine.     Labia:        Right: No rash, tenderness, lesion or injury.        Left: No rash, tenderness, lesion or injury.      Vagina: Normal. No signs of injury and foreign body. No vaginal discharge, erythema, tenderness, bleeding, lesions or prolapsed vaginal walls.     Cervix: No cervical motion tenderness, discharge, friability, lesion, erythema, cervical  bleeding or eversion.     Uterus: Normal. Not deviated, not enlarged, not fixed, not tender and no uterine prolapse.      Adnexa: Right adnexa normal and left adnexa normal.     Comments: No tenderness, masses, or organomeglay present during bimanual exam .   Musculoskeletal:        General: Normal range of motion.     Cervical back: Normal range of motion and neck supple.  Lymphadenopathy:     Cervical: No cervical adenopathy.     Lower Body: No right inguinal adenopathy. No left inguinal adenopathy.  Skin:    General: Skin is warm and dry.     Capillary Refill: Capillary refill takes less than 2 seconds.  Neurological:     General: No focal deficit present.     Mental Status: She is alert and oriented to person, place, and time.  Psychiatric:        Mood and Affect: Mood normal.        Behavior: Behavior normal.        Thought Content: Thought content normal.        Judgment: Judgment normal.      Assessment & Plan    1. Well woman exam Pap smear obtained - Cytology - PAP( [place])  2. Chronic low back pain with bilateral sciatica, unspecified back pain laterality May continue to take tramadol 100 mg twice daily as needed for pain. New prescription sent to her pharmacy today  - traMADol (ULTRAM) 50 MG tablet; Take 2 tablets (100 mg total) by mouth 2 (two) times daily.  Dispense: 120 tablet; Refill: 3  3. Hepatic cirrhosis, unspecified hepatic cirrhosis type, unspecified whether ascites present ([place]) Continue regular visits with GI as scheduled  4. 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 Monitored routinely per GI and oncology.    Problem List Items Addressed This Visit       Digestive   Hepatic cirrhosis ([place])     Nervous and Auditory   Chronic low back pain with bilateral sciatica   Relevant Medications   traMADol (ULTRAM) 50 MG tablet     Hematopoietic and Hemostatic   Autoimmune thrombocytopenia ([place])   Other Visit  Diagnoses     Well woman exam    -  Primary   Relevant Orders   Cytology - PAP( [place]) (Completed)        Return in about 3 months (around 09/13/2022) for blood pressure, med refill. Marland Kitchen         Ronnell Freshwater, NP  [facility] [phone] (phone) [phone] (fax)  [place]

## 2022-06-13 ENCOUNTER — Encounter: Payer: Self-pay | Admitting: Nurse Practitioner

## 2022-06-13 ENCOUNTER — Ambulatory Visit (INDEPENDENT_AMBULATORY_CARE_PROVIDER_SITE_OTHER): Payer: Medicare Other | Admitting: Nurse Practitioner

## 2022-06-13 ENCOUNTER — Other Ambulatory Visit (HOSPITAL_COMMUNITY)
Admission: RE | Admit: 2022-06-13 | Discharge: 2022-06-13 | Disposition: A | Discharge status: Home / Self Care | Payer: Medicare Other | Source: Ambulatory Visit | Attending: Nurse Practitioner | Admitting: Nurse Practitioner

## 2022-06-13 VITALS — BP 111/68 | HR 75 | Ht 65.35 in | Wt 178.8 lb

## 2022-06-13 DIAGNOSIS — M5441 Lumbago with sciatica, right side: Secondary | ICD-10-CM | POA: Diagnosis not present

## 2022-06-13 DIAGNOSIS — G8929 Other chronic pain: Secondary | ICD-10-CM

## 2022-06-13 DIAGNOSIS — D693 Immune thrombocytopenic purpura: Secondary | ICD-10-CM

## 2022-06-13 DIAGNOSIS — K7460 Unspecified cirrhosis of liver: Secondary | ICD-10-CM | POA: Diagnosis not present

## 2022-06-13 DIAGNOSIS — Z1151 Encounter for screening for human papillomavirus (HPV): Secondary | ICD-10-CM | POA: Insufficient documentation

## 2022-06-13 DIAGNOSIS — M5442 Lumbago with sciatica, left side: Secondary | ICD-10-CM

## 2022-06-13 DIAGNOSIS — Z01419 Encounter for gynecological examination (general) (routine) without abnormal findings: Secondary | ICD-10-CM

## 2022-06-13 MED ORDER — TRAMADOL HCL 50 MG PO TABS: 100.0000 mg | ORAL_TABLET | Freq: Two times a day (BID) | ORAL | 3 refills | Status: DC

## 2022-06-15 ENCOUNTER — Encounter: Payer: Self-pay | Admitting: Nurse Practitioner

## 2022-06-15 LAB — CYTOLOGY - PAP
Comment: NEGATIVE
Diagnosis: NEGATIVE
High risk HPV: NEGATIVE

## 2022-07-18 ENCOUNTER — Other Ambulatory Visit: Payer: Self-pay | Admitting: Nurse Practitioner

## 2022-07-18 DIAGNOSIS — K219 Gastro-esophageal reflux disease without esophagitis: Secondary | ICD-10-CM

## 2022-07-26 ENCOUNTER — Telehealth: Payer: Medicare Other | Admitting: Family

## 2022-07-26 DIAGNOSIS — J069 Acute upper respiratory infection, unspecified: Secondary | ICD-10-CM

## 2022-07-26 MED ORDER — CETIRIZINE HCL 10 MG PO TABS: 10.0000 mg | ORAL_TABLET | Freq: Every day | ORAL | 1 refills | Status: DC

## 2022-07-26 MED ORDER — FLUTICASONE PROPIONATE 50 MCG/ACT NA SUSP: 2.0000 | Freq: Every day | NASAL | 6 refills | Status: DC

## 2022-07-26 NOTE — Progress Notes (Signed)
Virtual Visit Consent   Angie [name], you are scheduled for a virtual visit with a [facility] provider today. Just as with appointments in the office, your consent must be obtained to participate. Your consent will be active for this visit and any virtual visit you may have with one of our providers in the next 365 days. If you have a MyChart account, a copy of this consent can be sent to you electronically.  As this is a virtual visit, video technology does not allow for your provider to perform a traditional examination. This may limit your provider's ability to fully assess your condition. If your provider identifies any concerns that need to be evaluated in person or the need to arrange testing (such as labs, EKG, etc.), we will make arrangements to do so. Although advances in technology are sophisticated, we cannot ensure that it will always work on either your end or our end. If the connection with a video visit is poor, the visit may have to be switched to a telephone visit. With either a video or telephone visit, we are not always able to ensure that we have a secure connection.  By engaging in this virtual visit, you consent to the provision of healthcare and authorize for your insurance to be billed (if applicable) for the services provided during this visit. Depending on your insurance coverage, you may receive a charge related to this service.  I need to obtain your verbal consent now. Are you willing to proceed with your visit today? Angie [name] has provided verbal consent on 07/26/2022 for a virtual visit (video or telephone). Angie Dun, FNP  Date: 07/26/2022 5:36 PM  Virtual Visit via Video Note   I, Angie [name], connected with  Angie [name]  ([id number], February 16, 1956) on 07/26/22 at  5:45 PM EST by a video-enabled telemedicine application and verified that I am speaking with the correct person using two identifiers.  Location: Patient: Virtual Visit Location Patient:  Home Provider: Virtual Visit Location Provider: Home Office   I discussed the limitations of evaluation and management by telemedicine and the availability of in person appointments. The patient expressed understanding and agreed to proceed.    History of Present Illness: Angie [name] is a 66 y.o. who identifies as a female who was assigned female at birth, and is being seen today for sinus congestion. She did a home COVID test that was negative.   HPI: Sinusitis This is a new problem. The current episode started yesterday. The problem is unchanged. There has been no fever. Her pain is at a severity of 7/10. The pain is mild. Associated symptoms include congestion, coughing, ear pain, headaches, sinus pressure, sneezing and a sore throat. Past treatments include acetaminophen and oral decongestants. The treatment provided mild relief.    Problems:  Patient Active Problem List   Diagnosis Date Noted   Mixed hyperlipidemia 02/19/2022   Encounter for Medicare annual wellness exam 04/10/2021   Other fatigue 04/10/2021   Screening for HIV (human immunodeficiency virus) 04/10/2021   Screening for colon cancer 04/10/2021   Healthcare maintenance 04/10/2021   BMI 26.0-26.9,adult 02/24/2021   Encounter to establish care 01/03/2021   BMI 28.0-28.9,adult 01/03/2021   Multinodular goiter 07/18/2020   Alopecia 07/18/2020   Encounter for screening mammogram for malignant neoplasm of breast 02/02/2020   Urinary tract infection with hematuria 12/14/2019   Vitamin D deficiency 10/23/2019   Encounter for general adult medical examination with abnormal findings 09/21/2019   Carpal  tunnel syndrome of right wrist 09/21/2019   Local superficial swelling, mass or lump 08/08/2019   Pain in right hand 07/10/2019   Gastroesophageal reflux disease without esophagitis 10/22/2018   Dysuria 09/16/2018   Overweight 09/16/2018   Cigarette smoker 12/31/2017   Routine cervical smear 12/31/2017   Acute  non-recurrent sinusitis 10/04/2017   Essential hypertension 10/04/2017   External hemorrhoids 10/04/2017   Abnormal weight gain 10/04/2017   Chronic low back pain with bilateral sciatica 10/04/2017   Autoimmune thrombocytopenia ([place]) 02/21/2015   Thrombocytopenia ([place]) 02/21/2015   Ureteral stone with hydronephrosis 02/17/2015   Hepatic cirrhosis ([place]) 03/03/2014    Allergies:  Allergies  Allergen Reactions   Aspirin Nausea And Vomiting   Nsaids Other (See Comments)    Liver function/[place]   Medications:  Current Outpatient Medications:    cetirizine (ZYRTEC ALLERGY) 10 MG tablet, Take 1 tablet (10 mg total) by mouth daily., Disp: 90 tablet, Rfl: 1   fluticasone (FLONASE) 50 MCG/ACT nasal spray, Place 2 sprays into both nostrils daily., Disp: 16 g, Rfl: 6   Biotin 1 MG CAPS, Take 1,000 mcg by mouth daily., Disp: , Rfl:    lisinopril-hydrochlorothiazide (ZESTORETIC) 10-12.5 MG tablet, Take 1 tablet by mouth daily., Disp: 90 tablet, Rfl: 1   ondansetron (ZOFRAN) 8 MG tablet, Take by mouth., Disp: , Rfl:    pantoprazole (PROTONIX) 40 MG tablet, Take 1 tablet by mouth once daily, Disp: 90 tablet, Rfl: 0   prochlorperazine (COMPAZINE) 10 MG tablet, Take by mouth., Disp: , Rfl:    PROCTO-MED HC 2.5 % rectal cream, Apply topically., Disp: , Rfl:    traMADol (ULTRAM) 50 MG tablet, Take 2 tablets (100 mg total) by mouth 2 (two) times daily., Disp: 120 tablet, Rfl: 3  Observations/Objective: Patient is well-developed, well-nourished in no acute distress.  Resting comfortably  at home.  Head is normocephalic, atraumatic.  No labored breathing.  Speech is clear and coherent with logical content.  Patient is alert and oriented at baseline.    Assessment and Plan: 1. Viral URI - cetirizine (ZYRTEC ALLERGY) 10 MG tablet; Take 1 tablet (10 mg total) by mouth daily.  Dispense: 90 tablet; Refill: 1 - fluticasone (FLONASE) 50 MCG/ACT nasal spray; Place 2 sprays into both nostrils daily.   Dispense: 16 g; Refill: 6  - Take meds as prescribed - Use a cool mist humidifier  -Use saline nose sprays frequently -Force fluids -For any cough or congestion  Use plain Mucinex- regular strength or max strength is fine -For fever or aces or pains- take tylenol or ibuprofen. -Throat lozenges if help -Follow up if symptoms worsen or do not improve   Follow Up Instructions: I discussed the assessment and treatment plan with the patient. The patient was provided an opportunity to ask questions and all were answered. The patient agreed with the plan and demonstrated an understanding of the instructions.  A copy of instructions were sent to the patient via MyChart unless otherwise noted below.     The patient was advised to call back or seek an in-person evaluation if the symptoms worsen or if the condition fails to improve as anticipated.  Time:  I spent 7 minutes with the patient via telehealth technology discussing the above problems/concerns.    Angie Dun, FNP

## 2022-07-26 NOTE — Patient Instructions (Signed)
Viral Respiratory Infection A respiratory infection is an illness that affects part of the respiratory system, such as the lungs, nose, or throat. A respiratory infection that is caused by a virus is called a viral respiratory infection. Common types of viral respiratory infections include: A cold. The flu (influenza). A respiratory syncytial virus (RSV) infection. What are the causes? This condition is caused by a virus. The virus may spread through contact with droplets or direct contact with infected people or their mucus or secretions. The virus may spread from person to person (is contagious). What are the signs or symptoms? Symptoms of this condition include: A stuffy or runny nose. A sore throat or cough. Shortness of breath or difficulty breathing. Yellow or green mucus (sputum). Other symptoms may include: A fever. Sweating or chills. Fatigue. Achy muscles. A headache. How is this diagnosed? This condition may be diagnosed based on: Your symptoms. A physical exam. Testing of secretions from the nose or throat. Chest X-ray. How is this treated? This condition may be treated with medicines, such as: Antiviral medicine. This may shorten the length of time a person has symptoms. Expectorants. These make it easier to cough up mucus. Decongestant nasal sprays. Acetaminophen or NSAIDs, such as ibuprofen, to relieve fever and pain. Antibiotic medicines are not prescribed for viral infections.This is because antibiotics are designed to kill bacteria. They do not kill viruses. Follow these instructions at home: Managing pain and congestion Take over-the-counter and prescription medicines only as told by your health care provider. If you have a sore throat, gargle with a mixture of salt and water 3-4 times a day or as needed. To make salt water, completely dissolve -1 tsp (3-6 g) of salt in 1 cup (237 mL) of warm water. Use nose drops made from salt water to ease congestion and  soften raw skin around your nose. Take 2 tsp (10 mL) of honey at bedtime to lessen coughing at night. Do not give honey to children who are younger than 1 year. Drink enough fluid to keep your urine pale yellow. This helps prevent dehydration and helps loosen up mucus. General instructions  Rest as much as possible. Do not drink alcohol. Do not use any products that contain nicotine or tobacco. These products include cigarettes, chewing tobacco, and vaping devices, such as e-cigarettes. If you need help quitting, ask your health care provider. Keep all follow-up visits. This is important. How is this prevented?     Get an annual flu shot. You may get the flu shot in late summer, fall, or winter. Ask your health care provider when you should get your flu shot. Avoid spreading your infection to other people. If you are sick: Wash your hands with soap and water often, especially after you cough or sneeze. Wash for at least 20 seconds. If soap and water are not available, use alcohol-based hand sanitizer. Cover your mouth when you cough. Cover your nose and mouth when you sneeze. Do not share cups or eating utensils. Clean commonly used objects often. Clean commonly touched surfaces. Stay home from work or school as told by your health care provider. Avoid contact with people who are sick during cold and flu season. This is generally fall and winter. Contact a health care provider if: Your symptoms last for 10 days or longer. Your symptoms get worse over time. You have severe sinus pain in your face or forehead. The glands in your jaw or neck become very swollen. You have shortness of breath. Get   help right away if you: Feel pain or pressure in your chest. Have trouble breathing. Faint or feel like you will faint. Have severe and persistent vomiting. Feel confused or disoriented. These symptoms may represent a serious problem that is an emergency. Do not wait to see if the symptoms will  go away. Get medical help right away. Call your local emergency services (911 in the [place] Do not drive yourself to the hospital. Summary A respiratory infection is an illness that affects part of the respiratory system, such as the lungs, nose, or throat. A respiratory infection that is caused by a virus is called a viral respiratory infection. Common types of viral respiratory infections include a cold, influenza, and respiratory syncytial virus (RSV) infection. Symptoms of this condition include a stuffy or runny nose, cough, fatigue, achy muscles, sore throat, and fevers or chills. Antibiotic medicines are not prescribed for viral infections. This is because antibiotics are designed to kill bacteria. They are not effective against viruses. This information is not intended to replace advice given to you by your health care provider. Make sure you discuss any questions you have with your health care provider. Document Revised: 11/12/2020 Document Reviewed: 11/12/2020 Elsevier Patient Education  2023 Elsevier Inc.  

## 2022-08-02 DIAGNOSIS — C220 Liver cell carcinoma: Secondary | ICD-10-CM | POA: Diagnosis not present

## 2022-08-02 DIAGNOSIS — Z23 Encounter for immunization: Secondary | ICD-10-CM | POA: Diagnosis not present

## 2022-08-02 DIAGNOSIS — C221 Intrahepatic bile duct carcinoma: Secondary | ICD-10-CM | POA: Diagnosis not present

## 2022-09-13 ENCOUNTER — Ambulatory Visit (INDEPENDENT_AMBULATORY_CARE_PROVIDER_SITE_OTHER): Payer: 59 | Admitting: Nurse Practitioner

## 2022-09-13 ENCOUNTER — Encounter: Payer: Self-pay | Admitting: Nurse Practitioner

## 2022-09-13 VITALS — BP 127/67 | HR 74 | Ht 65.35 in | Wt 185.4 lb

## 2022-09-13 DIAGNOSIS — R635 Abnormal weight gain: Secondary | ICD-10-CM

## 2022-09-13 DIAGNOSIS — K219 Gastro-esophageal reflux disease without esophagitis: Secondary | ICD-10-CM

## 2022-09-13 DIAGNOSIS — E042 Nontoxic multinodular goiter: Secondary | ICD-10-CM

## 2022-09-13 DIAGNOSIS — M5442 Lumbago with sciatica, left side: Secondary | ICD-10-CM

## 2022-09-13 DIAGNOSIS — M5441 Lumbago with sciatica, right side: Secondary | ICD-10-CM | POA: Diagnosis not present

## 2022-09-13 DIAGNOSIS — E559 Vitamin D deficiency, unspecified: Secondary | ICD-10-CM | POA: Diagnosis not present

## 2022-09-13 DIAGNOSIS — G8929 Other chronic pain: Secondary | ICD-10-CM

## 2022-09-13 DIAGNOSIS — I10 Essential (primary) hypertension: Secondary | ICD-10-CM | POA: Diagnosis not present

## 2022-09-13 MED ORDER — LISINOPRIL-HYDROCHLOROTHIAZIDE 10-12.5 MG PO TABS: 1.0000 | ORAL_TABLET | Freq: Every day | ORAL | 1 refills | Status: DC

## 2022-09-13 MED ORDER — PANTOPRAZOLE SODIUM 40 MG PO TBEC: 40.0000 mg | DELAYED_RELEASE_TABLET | Freq: Every day | ORAL | 1 refills | Status: DC

## 2022-09-13 MED ORDER — TRAMADOL HCL 50 MG PO TABS: 100.0000 mg | ORAL_TABLET | Freq: Two times a day (BID) | ORAL | 3 refills | Status: DC

## 2022-09-13 NOTE — Progress Notes (Incomplete)
Established patient visit   Patient: Angie [name]   DOB: 1956-02-07   67 y.o. Female  MRN: [id number] Visit Date: 09/13/2022   No chief complaint on file.  Subjective    HPI  Follow up  -hypertension  -chronic hip and back pain Does take tramadol 100 mg twice daily when needed. Will need refill for this today  Recently had laparoscopy/diagnostic  -reviewed PDMP 09/13/2022  -risk score for unintentional overdose is below average at 230.  -there are no red flags or state indicators.  -fill history is appropriate.  -currently being treated for cholangiocarcinoma at [facility]  Medications: Outpatient Medications Prior to Visit  Medication Sig  . Biotin 1 MG CAPS Take 1,000 mcg by mouth daily.  . cetirizine (ZYRTEC ALLERGY) 10 MG tablet Take 1 tablet (10 mg total) by mouth daily.  . fluticasone (FLONASE) 50 MCG/ACT nasal spray Place 2 sprays into both nostrils daily.  Marland Kitchen lisinopril-hydrochlorothiazide (ZESTORETIC) 10-12.5 MG tablet Take 1 tablet by mouth daily.  . ondansetron (ZOFRAN) 8 MG tablet Take by mouth.  . pantoprazole (PROTONIX) 40 MG tablet Take 1 tablet by mouth once daily  . prochlorperazine (COMPAZINE) 10 MG tablet Take by mouth.  Marland Kitchen PROCTO-MED HC 2.5 % rectal cream Apply topically.  . traMADol (ULTRAM) 50 MG tablet Take 2 tablets (100 mg total) by mouth 2 (two) times daily.   No facility-administered medications prior to visit.    Review of Systems  {Labs (Optional):23779}   Objective    There were no vitals filed for this visit. There is no height or weight on file to calculate BMI.  BP Readings from Last 3 Encounters:  06/13/22 111/68  02/10/22 107/63  10/28/21 99/60    Wt Readings from Last 3 Encounters:  06/13/22 178 lb 12.8 oz (81.1 kg)  02/10/22 165 lb 8 oz (75.1 kg)  10/28/21 161 lb 12.8 oz (73.4 kg)    Physical Exam  ***  No results found for any visits on 09/13/22.  Assessment & Plan     Problem List Items Addressed This Visit   None     No follow-ups on file.         Ronnell Freshwater, NP  [facility] at [facility] [phone] (phone) [phone] (fax)  [place]

## 2022-09-13 NOTE — Progress Notes (Signed)
Established patient visit   Patient: Angie [name]   DOB: 10/09/1955   67 y.o. Female  MRN: [id number] Visit Date: 09/13/2022   Chief Complaint  Patient presents with   Follow-up    Subjective    HPI  Follow up  -hypertension  -chronic hip and back pain Does take tramadol 100 mg twice daily when needed. Will need refill for this today  Recently had laparoscopy/diagnostic  -reviewed PDMP 09/13/2022  -risk score for unintentional overdose is below average at 230.  -there are no red flags or state indicators.  -fill history is appropriate.   Recently treated for cholangiocarcinoma  --scans on 08/02/2022 were good.  --will have follow ups every 3 months for several visits, then will space out scans and visits .  Over the course of treatment, she has gained more than 20 pounds.  -has taken phentermine for weight loss in the past. Did very well.  -will talk to oncologist in near future to see if this is something we can do again.   Medications: Outpatient Medications Prior to Visit  Medication Sig   Biotin 1 MG CAPS Take 1,000 mcg by mouth daily.   cetirizine (ZYRTEC ALLERGY) 10 MG tablet Take 1 tablet (10 mg total) by mouth daily.   fluticasone (FLONASE) 50 MCG/ACT nasal spray Place 2 sprays into both nostrils daily.   ondansetron (ZOFRAN) 8 MG tablet Take by mouth.   prochlorperazine (COMPAZINE) 10 MG tablet Take by mouth.   [DISCONTINUED] lisinopril-hydrochlorothiazide (ZESTORETIC) 10-12.5 MG tablet Take 1 tablet by mouth daily.   [DISCONTINUED] pantoprazole (PROTONIX) 40 MG tablet Take 1 tablet by mouth once daily   [DISCONTINUED] traMADol (ULTRAM) 50 MG tablet Take 2 tablets (100 mg total) by mouth 2 (two) times daily.   [DISCONTINUED] PROCTO-MED HC 2.5 % rectal cream Apply topically.   No facility-administered medications prior to visit.    Review of Systems Review of Systems  Constitutional:  Negative for activity change, appetite change, chills, fatigue and fever.   HENT:  Negative for congestion, postnasal drip, rhinorrhea, sinus pressure, sinus pain, sneezing and sore throat.   Eyes: Negative.   Respiratory:  Negative for cough, chest tightness, shortness of breath and wheezing.   Cardiovascular:  Negative for chest pain and palpitations.  Gastrointestinal:  Negative for abdominal pain, constipation, diarrhea, nausea and vomiting.  Endocrine: Negative for cold intolerance, heat intolerance, polydipsia and polyuria.  Genitourinary:  Negative for dyspareunia, dysuria, flank pain, frequency and urgency.  Musculoskeletal:  Negative for arthralgias, back pain and myalgias.  Skin:  Negative for rash.  Allergic/Immunologic: Negative for environmental allergies.  Neurological:  Negative for dizziness, weakness and headaches.  Hematological:  Negative for adenopathy.  Psychiatric/Behavioral:  The patient is not nervous/anxious.     Last CBC Lab Results  Component Value Date   WBC 4.6 08/06/2021   HGB 10.7 (L) 08/06/2021   HCT 34.2 (L) 08/06/2021   MCV 83.8 08/06/2021   MCH 26.2 08/06/2021   RDW 15.6 (H) 08/06/2021   PLT 159 123456   Last metabolic panel Lab Results  Component Value Date   GLUCOSE 106 (H) 04/15/2021   NA 138 04/15/2021   K 4.1 04/15/2021   CL 105 04/15/2021   CO2 23 04/15/2021   BUN 18 04/15/2021   CREATININE 0.80 04/15/2021   GFRNONAA >60 04/15/2021   CALCIUM 9.4 04/15/2021   PROT 7.5 04/15/2021   ALBUMIN 3.9 04/15/2021   BILITOT 0.7 04/15/2021   ALKPHOS 70 04/15/2021   AST 28 04/15/2021  ALT 35 04/15/2021   ANIONGAP 10 04/15/2021   Last lipids Lab Results  Component Value Date   CHOL 231 (H) 04/15/2021   HDL 81 04/15/2021   LDLCALC 115 (H) 04/15/2021   TRIG 177 (H) 04/15/2021   CHOLHDL 2.9 04/15/2021   Last hemoglobin A1c Lab Results  Component Value Date   HGBA1C 5.7 (H) 03/08/2018   Last thyroid functions Lab Results  Component Value Date   TSH 0.847 05/11/2022   T3TOTAL 107 06/25/2020   Last  vitamin D Lab Results  Component Value Date   VD25OH 26.33 (L) 10/07/2019       Objective     Today's Vitals   09/13/22 1313  BP: 127/67  Pulse: 74  SpO2: 96%  Weight: 185 lb 6.4 oz (84.1 kg)  Height: 5' 5.35" (1.66 m)   Body mass index is 30.52 kg/m.  BP Readings from Last 3 Encounters:  09/13/22 127/67  06/13/22 111/68  02/10/22 107/63    Wt Readings from Last 3 Encounters:  09/13/22 185 lb 6.4 oz (84.1 kg)  06/13/22 178 lb 12.8 oz (81.1 kg)  02/10/22 165 lb 8 oz (75.1 kg)    Physical Exam  Physical Exam Vitals and nursing note reviewed.  Constitutional:      Appearance: Normal appearance. She is well-developed.  HENT:     Head: Normocephalic and atraumatic.     Nose: Nose normal.     Mouth/Throat:     Mouth: Mucous membranes are moist.     Pharynx: Oropharynx is clear.  Eyes:     Extraocular Movements: Extraocular movements intact.     Conjunctiva/sclera: Conjunctivae normal.     Pupils: Pupils are equal, round, and reactive to light.  Cardiovascular:     Rate and Rhythm: Normal rate and regular rhythm.     Pulses: Normal pulses.     Heart sounds: Normal heart sounds.  Pulmonary:     Effort: Pulmonary effort is normal.     Breath sounds: Normal breath sounds.  Abdominal:     Palpations: Abdomen is soft.  Musculoskeletal:        General: Normal range of motion.     Cervical back: Normal range of motion and neck supple.  Lymphadenopathy:     Cervical: No cervical adenopathy.  Skin:    General: Skin is warm and dry.     Capillary Refill: Capillary refill takes less than 2 seconds.  Neurological:     General: No focal deficit present.     Mental Status: She is alert and oriented to person, place, and time.  Psychiatric:        Mood and Affect: Mood normal.        Behavior: Behavior normal.        Thought Content: Thought content normal.        Judgment: Judgment normal.      Assessment & Plan    1. Essential hypertension Blood pressure  stable. Continue bp medication as prescribed  - lisinopril-hydrochlorothiazide (ZESTORETIC) 10-12.5 MG tablet; Take 1 tablet by mouth daily.  Dispense: 90 tablet; Refill: 1  2. Gastroesophageal reflux disease without esophagitis Continue pantoprazole as previously prescribed  - pantoprazole (PROTONIX) 40 MG tablet; Take 1 tablet (40 mg total) by mouth daily.  Dispense: 90 tablet; Refill: 1  3. Chronic low back pain with bilateral sciatica, unspecified back pain laterality May take tramadol 100 mg up to twice daily as needed for pain. New prescription sent to her pharmacy today  -  traMADol (ULTRAM) 50 MG tablet; Take 2 tablets (100 mg total) by mouth 2 (two) times daily.  Dispense: 120 tablet; Refill: 3  4. Abnormal weight gain Check thyroid panel for further evaluation. Consider adding back phentermine daily if labs good. Continue with low calorie, high-protein diet. Incorporate exercise into daily activities.    5. Vitamin D deficiency Check vitamin d level and treat deficiency as indicated.    6. Multinodular goiter Check thyroid panel    Problem List Items Addressed This Visit       Cardiovascular and Mediastinum   Essential hypertension - Primary   Relevant Medications   lisinopril-hydrochlorothiazide (ZESTORETIC) 10-12.5 MG tablet     Digestive   Gastroesophageal reflux disease without esophagitis   Relevant Medications   pantoprazole (PROTONIX) 40 MG tablet     Endocrine   Multinodular goiter     Nervous and Auditory   Chronic low back pain with bilateral sciatica   Relevant Medications   traMADol (ULTRAM) 50 MG tablet     Other   Abnormal weight gain   Vitamin D deficiency     Return in about 4 months (around 01/12/2023) for medicare wellness.         Ronnell Freshwater, NP  [facility] at [facility] [phone] (phone) [phone] (fax)  [place]

## 2022-09-14 ENCOUNTER — Other Ambulatory Visit: Payer: Self-pay | Admitting: Nurse Practitioner

## 2022-09-14 ENCOUNTER — Encounter: Payer: Self-pay | Admitting: Nurse Practitioner

## 2022-09-14 DIAGNOSIS — M1811 Unilateral primary osteoarthritis of first carpometacarpal joint, right hand: Secondary | ICD-10-CM | POA: Diagnosis not present

## 2022-09-14 DIAGNOSIS — M75101 Unspecified rotator cuff tear or rupture of right shoulder, not specified as traumatic: Secondary | ICD-10-CM | POA: Diagnosis not present

## 2022-09-14 DIAGNOSIS — Z6830 Body mass index (BMI) 30.0-30.9, adult: Secondary | ICD-10-CM

## 2022-09-14 MED ORDER — PHENTERMINE HCL 37.5 MG PO TABS: 37.5000 mg | ORAL_TABLET | Freq: Every day | ORAL | 1 refills | Status: DC

## 2022-09-28 DIAGNOSIS — M1812 Unilateral primary osteoarthritis of first carpometacarpal joint, left hand: Secondary | ICD-10-CM | POA: Diagnosis not present

## 2022-09-28 DIAGNOSIS — T8484XA Pain due to internal orthopedic prosthetic devices, implants and grafts, initial encounter: Secondary | ICD-10-CM | POA: Diagnosis not present

## 2022-09-28 DIAGNOSIS — Z96652 Presence of left artificial knee joint: Secondary | ICD-10-CM | POA: Diagnosis not present

## 2022-09-28 DIAGNOSIS — M25462 Effusion, left knee: Secondary | ICD-10-CM | POA: Diagnosis not present

## 2022-09-28 DIAGNOSIS — M25562 Pain in left knee: Secondary | ICD-10-CM | POA: Diagnosis not present

## 2022-09-28 DIAGNOSIS — M2352 Chronic instability of knee, left knee: Secondary | ICD-10-CM | POA: Diagnosis not present

## 2022-10-04 ENCOUNTER — Other Ambulatory Visit: Payer: Self-pay | Admitting: Orthopedic Surgery

## 2022-10-04 DIAGNOSIS — Z96652 Presence of left artificial knee joint: Secondary | ICD-10-CM

## 2022-10-10 DIAGNOSIS — R0989 Other specified symptoms and signs involving the circulatory and respiratory systems: Secondary | ICD-10-CM | POA: Diagnosis not present

## 2022-10-10 DIAGNOSIS — Z03818 Encounter for observation for suspected exposure to other biological agents ruled out: Secondary | ICD-10-CM | POA: Diagnosis not present

## 2022-10-10 DIAGNOSIS — H9202 Otalgia, left ear: Secondary | ICD-10-CM | POA: Diagnosis not present

## 2022-10-17 ENCOUNTER — Encounter
Admission: RE | Admit: 2022-10-17 | Discharge: 2022-10-17 | Disposition: A | Discharge status: Home / Self Care | Payer: 59 | Source: Ambulatory Visit | Attending: Orthopedic Surgery | Admitting: Orthopedic Surgery

## 2022-10-17 DIAGNOSIS — Z96652 Presence of left artificial knee joint: Secondary | ICD-10-CM | POA: Insufficient documentation

## 2022-10-17 DIAGNOSIS — Z471 Aftercare following joint replacement surgery: Secondary | ICD-10-CM | POA: Diagnosis not present

## 2022-10-17 DIAGNOSIS — T8484XA Pain due to internal orthopedic prosthetic devices, implants and grafts, initial encounter: Secondary | ICD-10-CM

## 2022-10-17 MED ORDER — TECHNETIUM TC 99M MEDRONATE IV KIT
20.0000 | PACK | Freq: Once | INTRAVENOUS | Status: AC | PRN
  Administered 2022-10-17: 21.11 via INTRAVENOUS

## 2022-11-01 DIAGNOSIS — Z08 Encounter for follow-up examination after completed treatment for malignant neoplasm: Secondary | ICD-10-CM | POA: Diagnosis not present

## 2022-11-01 DIAGNOSIS — Z8505 Personal history of malignant neoplasm of liver: Secondary | ICD-10-CM | POA: Diagnosis not present

## 2022-11-01 DIAGNOSIS — C221 Intrahepatic bile duct carcinoma: Secondary | ICD-10-CM | POA: Diagnosis not present

## 2022-11-01 DIAGNOSIS — Z87891 Personal history of nicotine dependence: Secondary | ICD-10-CM | POA: Diagnosis not present

## 2022-11-09 DIAGNOSIS — T8484XA Pain due to internal orthopedic prosthetic devices, implants and grafts, initial encounter: Secondary | ICD-10-CM | POA: Diagnosis not present

## 2022-11-09 DIAGNOSIS — Z96652 Presence of left artificial knee joint: Secondary | ICD-10-CM | POA: Diagnosis not present

## 2022-11-14 ENCOUNTER — Other Ambulatory Visit: Payer: Self-pay | Admitting: Nurse Practitioner

## 2022-11-14 DIAGNOSIS — Z6830 Body mass index (BMI) 30.0-30.9, adult: Secondary | ICD-10-CM

## 2022-12-05 DIAGNOSIS — K7469 Other cirrhosis of liver: Secondary | ICD-10-CM | POA: Diagnosis not present

## 2022-12-05 DIAGNOSIS — Z23 Encounter for immunization: Secondary | ICD-10-CM | POA: Diagnosis not present

## 2022-12-13 DIAGNOSIS — M25362 Other instability, left knee: Secondary | ICD-10-CM | POA: Diagnosis not present

## 2022-12-19 DIAGNOSIS — M25362 Other instability, left knee: Secondary | ICD-10-CM | POA: Diagnosis not present

## 2022-12-28 DIAGNOSIS — M25362 Other instability, left knee: Secondary | ICD-10-CM | POA: Diagnosis not present

## 2023-01-04 DIAGNOSIS — M25362 Other instability, left knee: Secondary | ICD-10-CM | POA: Diagnosis not present

## 2023-01-09 DIAGNOSIS — M25362 Other instability, left knee: Secondary | ICD-10-CM | POA: Diagnosis not present

## 2023-01-18 ENCOUNTER — Ambulatory Visit (INDEPENDENT_AMBULATORY_CARE_PROVIDER_SITE_OTHER): Payer: 59 | Admitting: Nurse Practitioner

## 2023-01-18 ENCOUNTER — Encounter: Payer: Self-pay | Admitting: Nurse Practitioner

## 2023-01-18 VITALS — BP 139/62 | HR 96 | Ht 65.0 in | Wt 173.5 lb

## 2023-01-18 DIAGNOSIS — M5441 Lumbago with sciatica, right side: Secondary | ICD-10-CM

## 2023-01-18 DIAGNOSIS — G8929 Other chronic pain: Secondary | ICD-10-CM

## 2023-01-18 DIAGNOSIS — Z0000 Encounter for general adult medical examination without abnormal findings: Secondary | ICD-10-CM

## 2023-01-18 DIAGNOSIS — I10 Essential (primary) hypertension: Secondary | ICD-10-CM

## 2023-01-18 DIAGNOSIS — M5442 Lumbago with sciatica, left side: Secondary | ICD-10-CM | POA: Diagnosis not present

## 2023-01-18 DIAGNOSIS — Z6828 Body mass index (BMI) 28.0-28.9, adult: Secondary | ICD-10-CM

## 2023-01-18 DIAGNOSIS — Z6830 Body mass index (BMI) 30.0-30.9, adult: Secondary | ICD-10-CM

## 2023-01-18 NOTE — Progress Notes (Signed)
Subjective:   Angie [name] is a 67 y.o. female who presents for Medicare Annual (Subsequent) preventive examination.  Review of Systems    See HPI        Objective:    Today's Vitals   01/18/23 1537 01/18/23 1553  BP: 139/62   Pulse: 96   Weight: 173 lb 8 oz (78.7 kg)   Height: 5\' 5"  (1.651 m)   PainSc:  5    Body mass index is 28.87 kg/m.  Wt Readings from Last 3 Encounters:  01/18/23 173 lb 8 oz (78.7 kg)  09/13/22 185 lb 6.4 oz (84.1 kg)  06/13/22 178 lb 12.8 oz (81.1 kg)     The patient is alert and oriented. She is pleasant and answers all questions appropriately. Breathing is non-labored. She is in no acute distress at this time.      Row Labels 01/18/2023    3:51 PM 09/20/2021    9:31 AM 08/06/2021   10:12 AM 10/19/2018   12:05 PM 03/27/2018    2:48 PM 04/10/2017   11:18 AM 03/27/2017    2:46 PM  Advanced Directives   Section Header. No data exists in this row.         Does Patient Have a Medical Advance Directive?   No No No No No No No  Would patient like information on creating a medical advance directive?   No - Guardian declined;No - Patient declined     No - Patient declined No - Patient declined    Current Medications (verified) Outpatient Encounter Medications as of 01/18/2023  Medication Sig   Biotin 1 MG CAPS Take 1,000 mcg by mouth daily.   cetirizine (ZYRTEC ALLERGY) 10 MG tablet Take 1 tablet (10 mg total) by mouth daily.   fluticasone (FLONASE) 50 MCG/ACT nasal spray Place 2 sprays into both nostrils daily.   ondansetron (ZOFRAN) 8 MG tablet Take by mouth.   pantoprazole (PROTONIX) 40 MG tablet Take 1 tablet (40 mg total) by mouth daily.   prochlorperazine (COMPAZINE) 10 MG tablet Take by mouth.   lisinopril-hydrochlorothiazide (ZESTORETIC) 10-12.5 MG tablet Take 1 tablet by mouth daily.   phentermine (ADIPEX-P) 37.5 MG tablet Take 1 tablet (37.5 mg total) by mouth daily before breakfast.   traMADol (ULTRAM) 50 MG tablet Take 2 tablets (100 mg  total) by mouth 2 (two) times daily.   [DISCONTINUED] lisinopril-hydrochlorothiazide (ZESTORETIC) 10-12.5 MG tablet Take 1 tablet by mouth daily.   [DISCONTINUED] phentermine (ADIPEX-P) 37.5 MG tablet TAKE 1 TABLET BY MOUTH ONCE DAILY BEFORE BREAKFAST   [DISCONTINUED] traMADol (ULTRAM) 50 MG tablet Take 2 tablets (100 mg total) by mouth 2 (two) times daily.   No facility-administered encounter medications on file as of 01/18/2023.    Allergies (verified) Aspirin and Nsaids   History: Past Medical History:  Diagnosis Date   Arthritis    Esophagitis, reflux    Gastritis    Hepatitis C 2014   treated with Harvoni   History of colon polyps    Hypertension    Kidney stone    Melanoma (HCC)    Osteoarthritis    Sepsis due to urinary tract infection (HCC) 2014   Thrombocytopenia, primary (HCC)    Dr Orlie Dakin   Past Surgical History:  Procedure Laterality Date   COLONOSCOPY N/A 09/20/2021   Procedure: COLONOSCOPY;  Surgeon: Regis Bill, MD;  Location: [facility] ENDOSCOPY;  Service: Endoscopy;  Laterality: N/A;   COLONOSCOPY WITH PROPOFOL N/A 10/04/2016   Procedure: COLONOSCOPY WITH  PROPOFOL;  Surgeon: Christena Deem, MD;  Location: [facility] ENDOSCOPY;  Service: Endoscopy;  Laterality: N/A;   ESOPHAGOGASTRODUODENOSCOPY (EGD) WITH PROPOFOL N/A 05/13/2016   Procedure: ESOPHAGOGASTRODUODENOSCOPY (EGD) WITH PROPOFOL;  Surgeon: Christena Deem, MD;  Location: [facility] ENDOSCOPY;  Service: Endoscopy;  Laterality: N/A;   ESOPHAGOGASTRODUODENOSCOPY (EGD) WITH PROPOFOL N/A 10/19/2018   Procedure: ESOPHAGOGASTRODUODENOSCOPY (EGD) WITH PROPOFOL;  Surgeon: Christena Deem, MD;  Location: [facility] ENDOSCOPY;  Service: Endoscopy;  Laterality: N/A;   ESOPHAGOGASTRODUODENOSCOPY (EGD) WITH PROPOFOL N/A 09/20/2021   Procedure: ESOPHAGOGASTRODUODENOSCOPY (EGD) WITH PROPOFOL;  Surgeon: Regis Bill, MD;  Location: [facility] ENDOSCOPY;  Service: Endoscopy;  Laterality: N/A;   JOINT REPLACEMENT Left 2015    TONSILLECTOMY     TONSILLECTOMY     TOTAL KNEE ARTHROPLASTY     Family History  Problem Relation Age of Onset   Liver cancer Father    Cancer Father    COPD Father    Heart disease Father    COPD Mother    Hypertension Mother    Diabetes Sister    Diabetes Paternal Grandmother    Heart disease Paternal Grandmother    Hypertension Paternal Grandmother    Diabetes Paternal Grandfather    Heart disease Paternal Grandfather    Hypertension Paternal Grandfather    Breast cancer Neg Hx    Social History   Socioeconomic History   Marital status: Married    Spouse name: Shanikqua Running   Number of children: 3   Years of education: Not on file   Highest education level: Not on file  Occupational History   Not on file  Tobacco Use   Smoking status: Former    Types: Cigarettes   Smokeless tobacco: Never  Vaping Use   Vaping Use: Never used  Substance and Sexual Activity   Alcohol use: Yes    Alcohol/week: 1.0 standard drink of alcohol    Types: 1 Glasses of wine per week    Comment: occasionally    Drug use: No   Sexual activity: Yes    Birth control/protection: None, Post-menopausal  Other Topics Concern   Not on file  Social History Narrative   Not on file   Social Determinants of Health   Financial Resource Strain: Low Risk  (01/18/2023)   Overall Financial Resource Strain (CARDIA)    Difficulty of Paying Living Expenses: Not hard at all  Food Insecurity: No Food Insecurity (01/18/2023)   Hunger Vital Sign    Worried About Running Out of Food in the Last Year: Never true    Ran Out of Food in the Last Year: Never true  Transportation Needs: No Transportation Needs (01/18/2023)   PRAPARE - Administrator, Civil Service (Medical): No    Lack of Transportation (Non-Medical): No  Physical Activity: Insufficiently Active (01/18/2023)   Exercise Vital Sign    Days of Exercise per Week: 2 days    Minutes of Exercise per Session: 10 min  Stress: No Stress Concern  Present (01/18/2023)   [employer] of Occupational Health - Occupational Stress Questionnaire    Feeling of Stress : Not at all  Social Connections: Moderately Isolated (01/18/2023)   Social Connection and Isolation Panel [NHANES]    Frequency of Communication with Friends and Family: More than three times a week    Frequency of Social Gatherings with Friends and Family: More than three times a week    Attends Religious Services: Never    Database administrator or Organizations: No  Attends Banker Meetings: Never    Marital Status: Married    Tobacco Counseling Counseling given: Not Answered   Clinical Intake:     Pain : 0-10 Pain Score: 5  Pain Type: Acute pain Pain Location: Hip Pain Onset: 1 to 4 weeks ago        How often do you need to have someone help you when you read instructions, pamphlets, or other written materials from your doctor or pharmacy?: (Pended) 1 - Never  Diabetic?no         Activities of Daily Living   Row Labels 01/18/2023    4:18 PM 01/17/2023    5:24 PM  In your present state of health, do you have any difficulty performing the following activities:   Section Header. No data exists in this row.    Hearing?   0 0  Vision?   0 0  Difficulty concentrating or making decisions?   0 0  Walking or climbing stairs?   0 0  Dressing or bathing?   0 0  Doing errands, shopping?   0 0  Preparing Food and eating ?   N N  Using the Toilet?   N N  In the past six months, have you accidently leaked urine?   Y Y  Do you have problems with loss of bowel control?   N N  Managing your Medications?   N N  Managing your Finances?   N N  Housekeeping or managing your Housekeeping?   N N    Patient Care Team: Carlean Jews, NP as PCP - General (Family Medicine)  Indicate any recent Medical Services you may have received from other than [facility] providers in the past year (date may be approximate).     Assessment:      Hearing/Vision screen No results found.   Depression Screen   Row Labels 01/18/2023    4:14 PM 09/13/2022    1:21 PM 06/13/2022    1:35 PM 02/10/2022    1:10 PM 10/28/2021   11:42 AM 04/02/2021    8:40 AM 01/19/2021    1:39 PM  PHQ 2/9 Scores   Section Header. No data exists in this row.         PHQ - 2 Score   0 0 0 0 0 0 0  PHQ- 9 Score    0 0 1 0 0 0    Fall Risk   Row Labels 01/17/2023    5:24 PM 09/13/2022    1:21 PM 02/09/2022    7:31 AM 04/02/2021    8:40 AM 02/10/2021   11:33 AM  Fall Risk    Section Header. No data exists in this row.       Falls in the past year?   1 0 0 0 0  Number falls in past yr:   0 0 0 0 0  Injury with Fall?   0 0 0 0 0  Risk for fall due to :      No Fall Risks   Follow up    Falls evaluation completed  Falls evaluation completed Falls evaluation completed    FALL RISK PREVENTION PERTAINING TO THE HOME:  Any stairs in or around the home? Yes  If so, are there any without handrails? No  Home free of loose throw rugs in walkways, pet beds, electrical cords, etc? Yes  Adequate lighting in your home to reduce risk of falls? Yes   ASSISTIVE DEVICES UTILIZED  TO PREVENT FALLS:  Life alert? No  Use of a cane, walker or w/c? No  Grab bars in the bathroom? Yes  Shower chair or bench in shower? Yes  Elevated toilet seat or a handicapped toilet? Yes   TIMED UP AND GO:  Was the test performed? No .  Length of time to ambulate 10 feet: 0 sec.   Gait not assessed as patient  is not in office   Cognitive Function:   Row Labels 09/19/2019    3:04 PM 09/07/2018    2:19 PM  MMSE - Mini Mental State Exam   Section Header. No data exists in this row.    Orientation to time   5 5  Orientation to Place   5 5  Registration   3 3  Attention/ Calculation   5 5  Recall   3 3  Language- name 2 objects   2 2  Language- repeat   1 1  Language- follow 3 step command   3 3  Language- read & follow direction   1 1  Write a sentence   1 1  Copy design   1  1  Total score   30 30       Row Labels 01/18/2023    3:42 PM 02/10/2022    1:13 PM 04/02/2021    8:41 AM  6CIT Screen   Section Header. No data exists in this row.     What Year?   0 points 0 points 0 points  What month?     0 points  What time?   0 points 0 points 0 points  Count back from 20   0 points 0 points 0 points  Months in reverse   0 points 0 points 0 points  Repeat phrase   0 points 0 points 0 points  Total Score     0 points    Immunizations Immunization History  Administered Date(s) Administered   Fluad Quad(high Dose 65+) 08/02/2022   Hepatitis B, ADULT 12/20/2021   Hepatitis B, Dialysis 05/24/2022   Influenza Inj Mdck Quad Pf 11/04/2019   Influenza, Quadrivalent, Recombinant, Inj, Pf 05/30/2019   Influenza,inj,Quad PF,6+ Mos 06/11/2018   Influenza-Unspecified 11/04/2019, 06/25/2021   PFIZER Comirnaty(Gray Top)Covid-19 Tri-Sucrose Vaccine 12/27/2019, 01/17/2020, 09/04/2020   PFIZER(Purple Top)SARS-COV-2 Vaccination 12/27/2019, 01/17/2020   Zoster Recombinat (Shingrix) 02/11/2018, 04/19/2018    TDAP status: Due, Education has been provided regarding the importance of this vaccine. Advised may receive this vaccine at local pharmacy or Health Dept. Aware to provide a copy of the vaccination record if obtained from local pharmacy or Health Dept. Verbalized acceptance and understanding.  Flu Vaccine status: Due, Education has been provided regarding the importance of this vaccine. Advised may receive this vaccine at local pharmacy or Health Dept. Aware to provide a copy of the vaccination record if obtained from local pharmacy or Health Dept. Verbalized acceptance and understanding.  Pneumococcal vaccine status: Due, Education has been provided regarding the importance of this vaccine. Advised may receive this vaccine at local pharmacy or Health Dept. Aware to provide a copy of the vaccination record if obtained from local pharmacy or Health Dept. Verbalized acceptance  and understanding.  Covid-19 vaccine status: Completed vaccines  Qualifies for Shingles Vaccine? Yes   Zostavax completed Yes   Shingrix Completed?: Yes  Screening Tests Health Maintenance  Topic Date Due   HIV Screening  Never done   DTaP/Tdap/Td (1 - Tdap) Never done   COVID-19 Vaccine (6 -  2023-24 season) 04/22/2022   Pneumonia Vaccine 8+ Years old (1 of 1 - PCV) Never done   DEXA SCAN  Never done   INFLUENZA VACCINE  03/23/2023   MAMMOGRAM  05/19/2023   Medicare Annual Wellness (AWV)  01/18/2024   PAP SMEAR-Modifier  06/13/2025   Colonoscopy  09/21/2031   Zoster Vaccines- Shingrix  Completed   HPV VACCINES  Aged Out   Hepatitis C Screening  Discontinued    Health Maintenance  Health Maintenance Due  Topic Date Due   HIV Screening  Never done   DTaP/Tdap/Td (1 - Tdap) Never done   COVID-19 Vaccine (6 - 2023-24 season) 04/22/2022   Pneumonia Vaccine 12+ Years old (1 of 1 - PCV) Never done   DEXA SCAN  Never done    Colorectal cancer screening: Type of screening: Colonoscopy. Completed 2023. Repeat every 10 years  Mammogram status: Completed 2022. Repeat every year  Bone Density status: Ordered 01/18/2023. Pt provided with contact info and advised to call to schedule appt.  Lung Cancer Screening: (Low Dose CT Chest recommended if Age 69-80 years, 30 pack-year currently smoking OR have quit w/in 15years.) does not qualify.   Lung Cancer Screening Referral: n/a  Additional Screening:  Hepatitis C Screening: does not qualify; Completed n/a  Vision Screening: Recommended annual ophthalmology exams for early detection of glaucoma and other disorders of the eye. Is the patient up to date with their annual eye exam?  Yes  Who is the provider or what is the name of the office in which the patient attends annual eye exams? Dr. Eli Phillips If pt is not established with a provider, would they like to be referred to a provider to establish care? No .   Dental  Screening: Recommended annual dental exams for proper oral hygiene  Community Resource Referral / Chronic Care Management: CRR required this visit?  No   CCM required this visit?  No      Plan:     I have personally reviewed and noted the following in the patient's chart:   Medical and social history Use of alcohol, tobacco or illicit drugs  Current medications and supplements including opioid prescriptions. Patient is not currently taking opioid prescriptions. Functional ability and status Nutritional status Physical activity Advanced directives List of other physicians Hospitalizations, surgeries, and ER visits in previous 12 months Vitals Screenings to include cognitive, depression, and falls Referrals and appointments  In addition, I have reviewed and discussed with patient certain preventive protocols, quality metrics, and best practice recommendations. A written personalized care plan for preventive services as well as general preventive health recommendations were provided to patient.  I connected with Corena Pilgrim on 02/05/23 by audio only telemedicine application and verified that I am speaking with the correct person using two identifiers.   I discussed the limitations, risks, security and privacy concerns of performing an evaluation and management service by telephone and the availability of in person appointments. I also discussed with the patient responsible charge related to this service. The patient expressed understanding and verbally consented to this telephonic visit.   Locations of Patient:not in office Location of Provider:[place] [facility]    List any persons and their role that are participating in this visit with patient:     Carlean Jews, NP   02/05/2023   Nurse Notes: non face to face 30 min

## 2023-01-23 DIAGNOSIS — M25362 Other instability, left knee: Secondary | ICD-10-CM | POA: Diagnosis not present

## 2023-02-05 DIAGNOSIS — Z6830 Body mass index (BMI) 30.0-30.9, adult: Secondary | ICD-10-CM | POA: Insufficient documentation

## 2023-02-05 MED ORDER — PHENTERMINE HCL 37.5 MG PO TABS: 37.5000 mg | ORAL_TABLET | Freq: Every day | ORAL | 2 refills | Status: DC

## 2023-02-05 MED ORDER — LISINOPRIL-HYDROCHLOROTHIAZIDE 10-12.5 MG PO TABS: 1.0000 | ORAL_TABLET | Freq: Every day | ORAL | 1 refills | Status: DC

## 2023-02-05 MED ORDER — TRAMADOL HCL 50 MG PO TABS: 100.0000 mg | ORAL_TABLET | Freq: Two times a day (BID) | ORAL | 3 refills | Status: DC

## 2023-02-05 NOTE — Assessment & Plan Note (Signed)
Improved with 12 pound weight loss since most recent visit. -May continue phentermine 37.5 mg tablets daily as needed. -Continue with low calorie, high-protein diet. Incorporate exercise into daily activities.   

## 2023-02-05 NOTE — Assessment & Plan Note (Signed)
Patient will continue to take tramadol 100 mg up to twice daily as needed for pain. -New prescription sent to pharmacy.

## 2023-02-05 NOTE — Assessment & Plan Note (Deleted)
Improved with 12 pound weight loss since most recent visit. -May continue phentermine 37.5 mg tablets daily as needed. -Continue with low calorie, high-protein diet. Incorporate exercise into daily activities.

## 2023-02-05 NOTE — Assessment & Plan Note (Signed)
Stable.  Continue current medication

## 2023-02-05 NOTE — Assessment & Plan Note (Signed)
Annual Medicare wellness visit today.

## 2023-02-07 DIAGNOSIS — H524 Presbyopia: Secondary | ICD-10-CM | POA: Diagnosis not present

## 2023-02-07 DIAGNOSIS — H52223 Regular astigmatism, bilateral: Secondary | ICD-10-CM | POA: Diagnosis not present

## 2023-02-07 DIAGNOSIS — H2513 Age-related nuclear cataract, bilateral: Secondary | ICD-10-CM | POA: Diagnosis not present

## 2023-02-09 DIAGNOSIS — M25362 Other instability, left knee: Secondary | ICD-10-CM | POA: Diagnosis not present

## 2023-03-03 IMAGING — MG MM DIGITAL SCREENING BILAT W/ TOMO AND CAD
6 of 10 series · 6 of 30 positions shown · non-contrast
Comparison: Previous exam(s).

CLINICAL DATA: Screening.

EXAM:
DIGITAL SCREENING BILATERAL MAMMOGRAM WITH TOMOSYNTHESIS AND CAD
TECHNIQUE: Bilateral screening digital craniocaudal and mediolateral oblique
mammograms were obtained. Bilateral screening digital breast
tomosynthesis was performed. The images were evaluated with
computer-aided detection.

[L CC synth-2D]
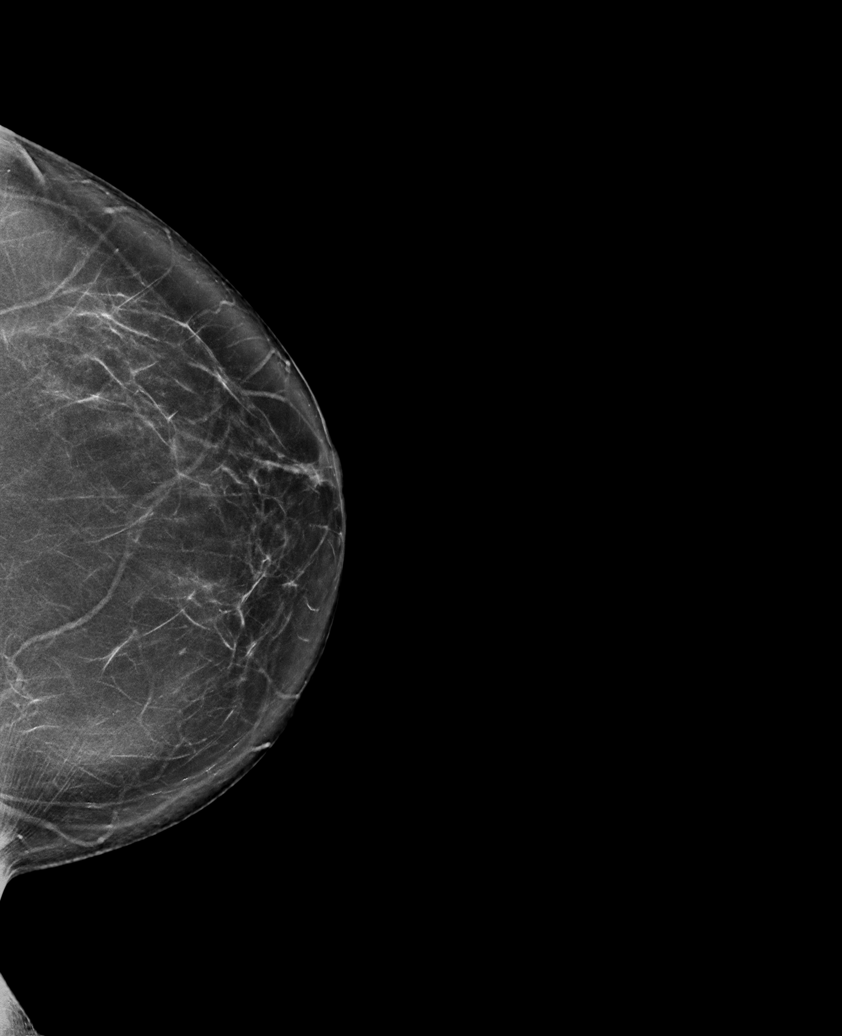

[L MLO synth-2D]
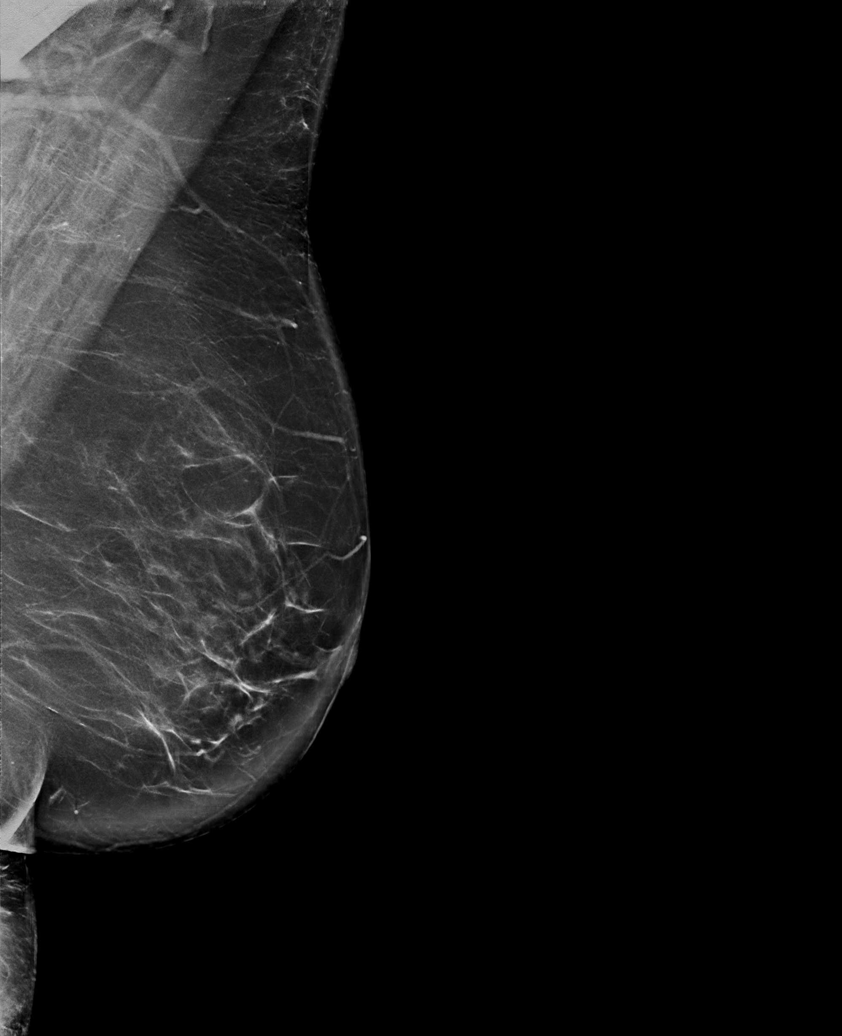

[R MLO synth-2D (1 of 2)]
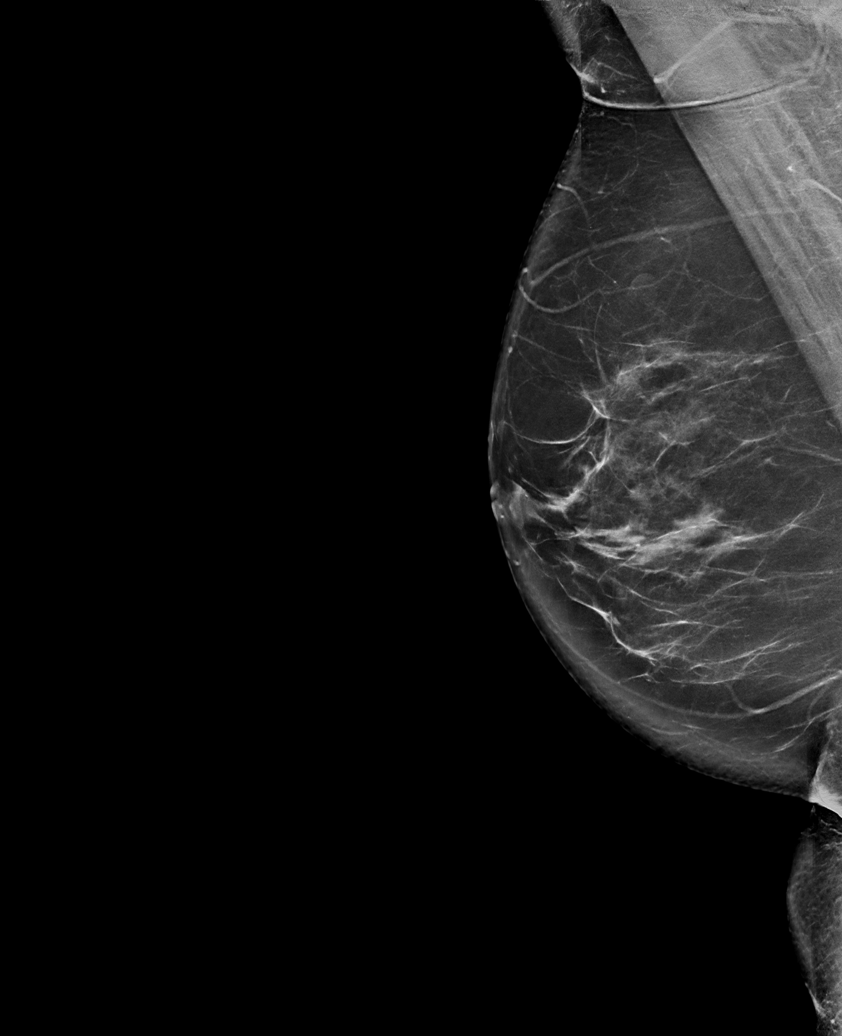

[R CC synth-2D]
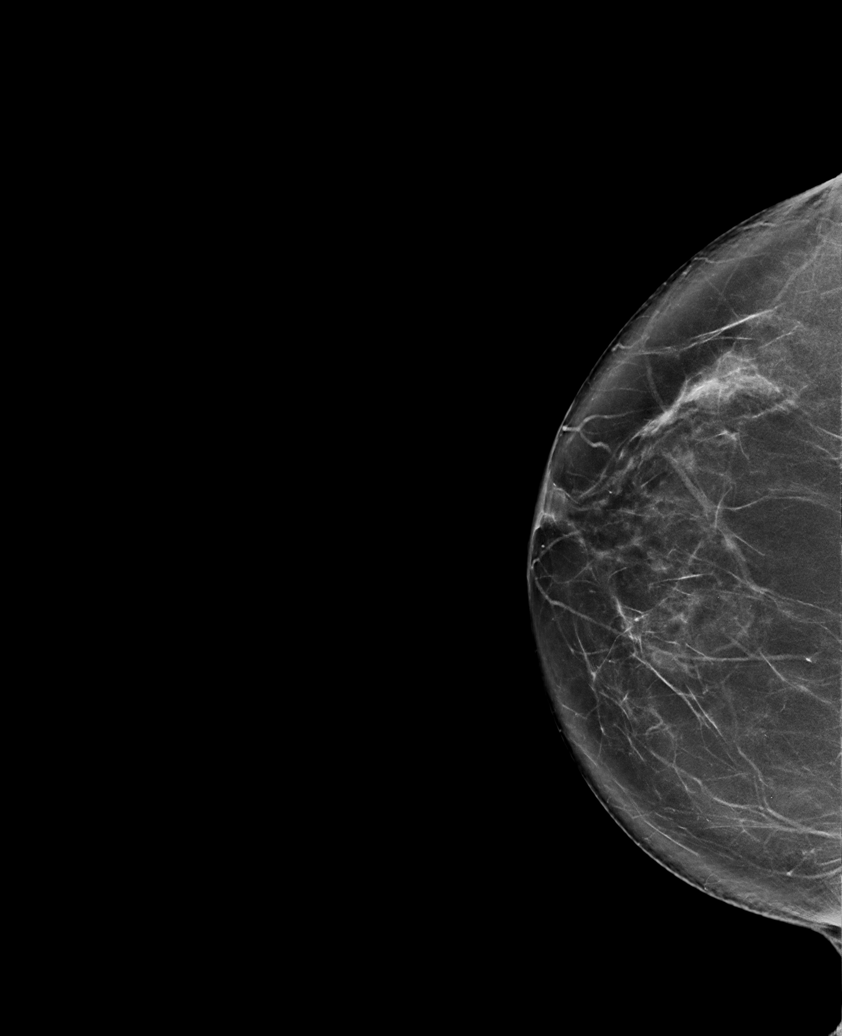

[R MLO synth-2D (2 of 2)]
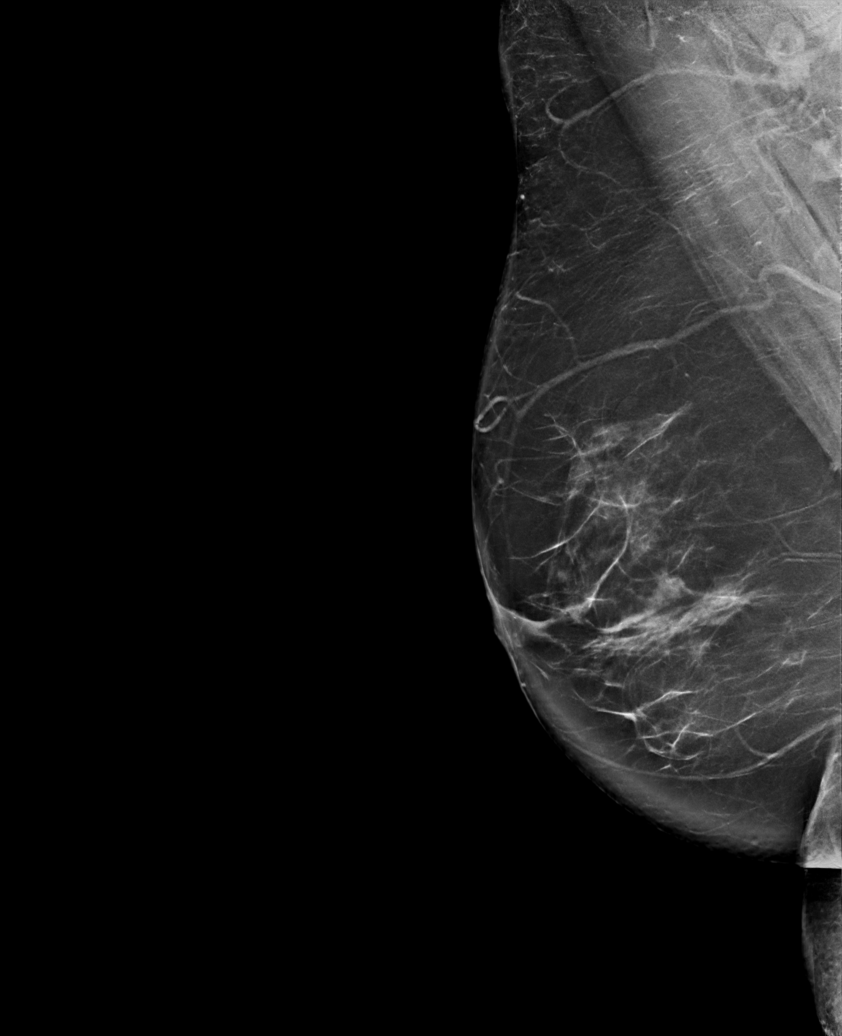

[R CC tomo · tomo slice 45/88.0]
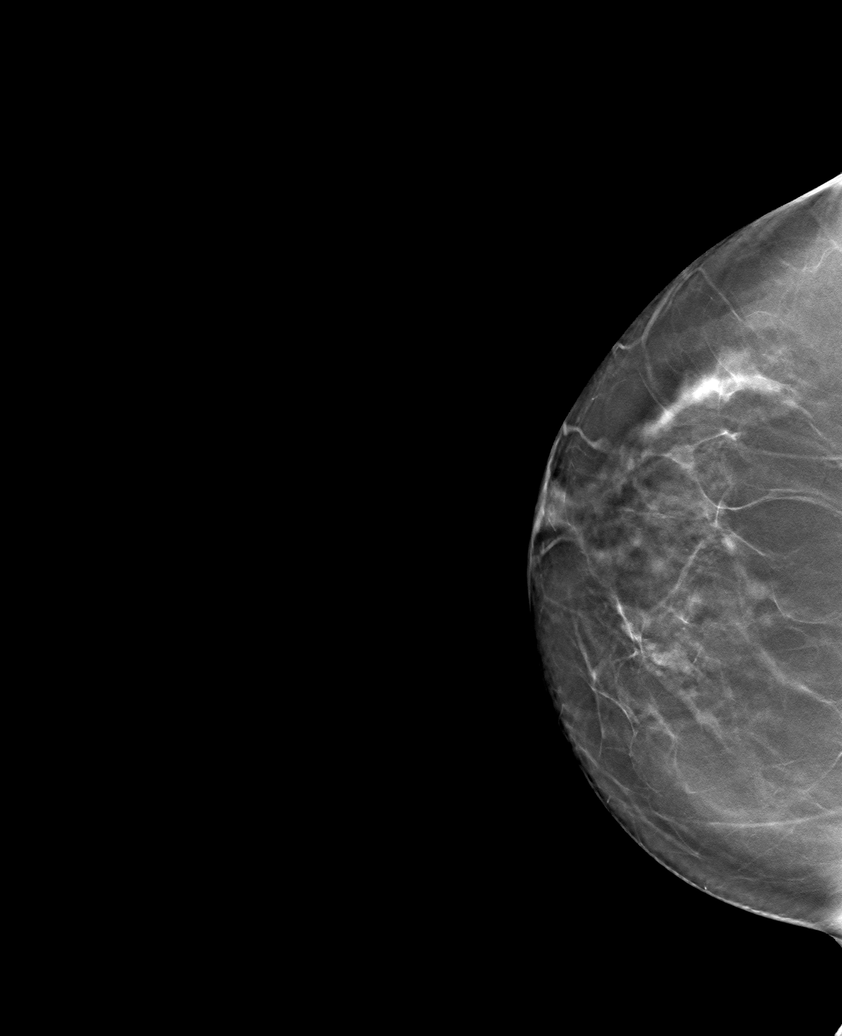

[6 of 30 positions shown; findings below may reference images not displayed]

ACR Breast Density Category b: There are scattered areas of
fibroglandular density.
FINDINGS: There are no findings suspicious for malignancy.
IMPRESSION: No mammographic evidence of malignancy. A result letter of this
screening mammogram will be mailed directly to the patient.

RECOMMENDATION:
Screening mammogram in one year. (Code:51-O-LD2)

BI-RADS CATEGORY  1: Negative.

## 2023-03-07 DIAGNOSIS — Z79899 Other long term (current) drug therapy: Secondary | ICD-10-CM | POA: Diagnosis not present

## 2023-03-07 DIAGNOSIS — Z9089 Acquired absence of other organs: Secondary | ICD-10-CM | POA: Diagnosis not present

## 2023-03-07 DIAGNOSIS — C221 Intrahepatic bile duct carcinoma: Secondary | ICD-10-CM | POA: Diagnosis not present

## 2023-03-07 DIAGNOSIS — C220 Liver cell carcinoma: Secondary | ICD-10-CM | POA: Diagnosis not present

## 2023-03-07 DIAGNOSIS — Z87891 Personal history of nicotine dependence: Secondary | ICD-10-CM | POA: Diagnosis not present

## 2023-03-07 DIAGNOSIS — K432 Incisional hernia without obstruction or gangrene: Secondary | ICD-10-CM | POA: Diagnosis not present

## 2023-04-04 DIAGNOSIS — K432 Incisional hernia without obstruction or gangrene: Secondary | ICD-10-CM | POA: Diagnosis not present

## 2023-04-06 ENCOUNTER — Other Ambulatory Visit: Payer: Self-pay | Admitting: Nurse Practitioner

## 2023-04-06 DIAGNOSIS — K219 Gastro-esophageal reflux disease without esophagitis: Secondary | ICD-10-CM

## 2023-04-17 DIAGNOSIS — C221 Intrahepatic bile duct carcinoma: Secondary | ICD-10-CM | POA: Diagnosis not present

## 2023-04-17 DIAGNOSIS — K7460 Unspecified cirrhosis of liver: Secondary | ICD-10-CM | POA: Diagnosis not present

## 2023-04-17 DIAGNOSIS — Z23 Encounter for immunization: Secondary | ICD-10-CM | POA: Diagnosis not present

## 2023-04-17 DIAGNOSIS — C220 Liver cell carcinoma: Secondary | ICD-10-CM | POA: Diagnosis not present

## 2023-04-28 ENCOUNTER — Encounter: Payer: Self-pay | Admitting: Family Medicine

## 2023-04-28 ENCOUNTER — Ambulatory Visit (INDEPENDENT_AMBULATORY_CARE_PROVIDER_SITE_OTHER): Payer: 59 | Admitting: Family Medicine

## 2023-04-28 VITALS — BP 137/87 | HR 94 | Resp 18 | Ht 65.0 in | Wt 167.0 lb

## 2023-04-28 DIAGNOSIS — K219 Gastro-esophageal reflux disease without esophagitis: Secondary | ICD-10-CM | POA: Diagnosis not present

## 2023-04-28 DIAGNOSIS — K744 Secondary biliary cirrhosis: Secondary | ICD-10-CM | POA: Diagnosis not present

## 2023-04-28 DIAGNOSIS — E782 Mixed hyperlipidemia: Secondary | ICD-10-CM

## 2023-04-28 DIAGNOSIS — G8929 Other chronic pain: Secondary | ICD-10-CM

## 2023-04-28 DIAGNOSIS — Z1159 Encounter for screening for other viral diseases: Secondary | ICD-10-CM

## 2023-04-28 DIAGNOSIS — Z6828 Body mass index (BMI) 28.0-28.9, adult: Secondary | ICD-10-CM

## 2023-04-28 DIAGNOSIS — M5441 Lumbago with sciatica, right side: Secondary | ICD-10-CM | POA: Diagnosis not present

## 2023-04-28 DIAGNOSIS — E669 Obesity, unspecified: Secondary | ICD-10-CM

## 2023-04-28 DIAGNOSIS — I10 Essential (primary) hypertension: Secondary | ICD-10-CM

## 2023-04-28 DIAGNOSIS — M5442 Lumbago with sciatica, left side: Secondary | ICD-10-CM

## 2023-04-28 MED ORDER — TRAMADOL HCL 50 MG PO TABS: 100.0000 mg | ORAL_TABLET | Freq: Two times a day (BID) | ORAL | 3 refills | Status: DC

## 2023-04-28 MED ORDER — LISINOPRIL-HYDROCHLOROTHIAZIDE 10-12.5 MG PO TABS: 1.0000 | ORAL_TABLET | Freq: Every day | ORAL | 1 refills | Status: DC

## 2023-04-28 MED ORDER — PHENTERMINE HCL 37.5 MG PO TABS: 37.5000 mg | ORAL_TABLET | Freq: Every day | ORAL | 2 refills | Status: DC

## 2023-04-28 MED ORDER — PANTOPRAZOLE SODIUM 40 MG PO TBEC: 40.0000 mg | DELAYED_RELEASE_TABLET | Freq: Every day | ORAL | 0 refills | Status: DC

## 2023-04-28 NOTE — Assessment & Plan Note (Signed)
Patient cannot take ibuprofen given risk with age and cannot take acetaminophen due to hepatic cirrhosis.  Chronic back pain managed with tramadol up to 100 mg a maximum of twice daily as needed for pain.  PDMP reviewed, no aberrancies.  Controlled substance contract updated today.  Will continue to monitor.

## 2023-04-28 NOTE — Assessment & Plan Note (Signed)
Tolerating phentermine well with no side effects.   Starting Weight: 185 lbs  Current weight: 167 lbs Previous weight: 173 lbs Change in weight: -6 lbs from last, 18 lbs total Dietary goals: high protein, portion control Exercise goals: walking routine Medication: continue phentermine 37.5 mg daily Follow-up and referrals: 3 months follow up

## 2023-04-28 NOTE — Assessment & Plan Note (Addendum)
Followed by [name] with [facility].  Per endocrinology 04/17/2023: Angie [name] has Childs A, compensated hepatitis C induced cirrhosis. She completed treatment with capecitabine in September 2023 after resection of her tumor by [name]. She is followed by medical oncology. She continues to be compensated from a liver perspective. She is doing well off chemotherapy and reports improvement in her energy and appetite. Labs were drawn March 07, 2023 and reviewed. MELD remains low, labs in July 2024.   She is immune to hepatitis A; she received her first hepatis B vaccine on 12/20/21, and second on 05/25/23.

## 2023-04-28 NOTE — Progress Notes (Signed)
Established Patient Office Visit  Subjective   Patient ID: Angie [name], female    DOB: 1956/04/13  Age: 67 y.o. MRN: [id number]  Chief Complaint  Patient presents with   Medical Management of Chronic Issues    HPI Angie [name] is [name] 67 y.o. female presenting today for follow up of hypertension, weight management, chronic back pain. Hypertension: Patient here for follow-up of elevated blood pressure. She is exercising and is adherent to low salt diet.   Pt denies chest pain, SOB, dizziness, edema, syncope, fatigue or heart palpitations. Taking lisinopril-hydrochlorothiazide 10-12.5 mg daily, reports excellent compliance with treatment. Denies side effects. Weight management: Weight has decreased 6 lbs since last visit. They have been following [name] high-protein diet prioritizing portion control. Exercise routine includes daily walking.  She is currently taking phentermine 37.5 mg daily. Chronic back pain: Currently managing with tramadol which she does typically take daily.  Ibuprofen is not recommended due to her age, and her gastroenterologist recommends to avoid Tylenol due to risk of hepatotoxicity.  ROS Negative unless otherwise noted in HPI   Objective:     BP 137/87 (BP Location: Left Arm, Patient Position: Sitting, Cuff Size: Normal)   Pulse 94   Resp 18   Ht 5\' 5"  (1.651 m)   Wt 167 lb (75.8 kg)   SpO2 97%   BMI 27.79 kg/m   Physical Exam Constitutional:      General: She is not in acute distress.    Appearance: Normal appearance.  HENT:     Head: Normocephalic and atraumatic.  Cardiovascular:     Rate and Rhythm: Normal rate and regular rhythm.     Heart sounds: No murmur heard.    No friction rub. No gallop.  Pulmonary:     Effort: Pulmonary effort is normal. No respiratory distress.     Breath sounds: No wheezing, rhonchi or rales.  Skin:    General: Skin is warm and dry.  Neurological:     Mental Status: She is alert and oriented to person, place, and  time.     Assessment & Plan:  Mixed hyperlipidemia Assessment & Plan: Lipid panel has not been collected since 2022.  We will collect prior to next appointment.  Orders: -     Lipid panel; Future  Essential hypertension Assessment & Plan: BP goal <140/90.  Stable, at goal.  Continue lisinopril-hydrochlorothiazide 10-12.5 mg daily.  CBC and CMP ordered with [facility] available in care everywhere, both within normal limits.  Will continue to monitor.  Orders: -     Lisinopril-hydroCHLOROthiazide; Take 1 tablet by mouth daily.  Dispense: 90 tablet; Refill: 1  Gastroesophageal reflux disease without esophagitis -     Pantoprazole Sodium; Take 1 tablet (40 mg total) by mouth daily.  Dispense: 90 tablet; Refill: 0  Chronic low back pain with bilateral sciatica, unspecified back pain laterality Assessment & Plan: Patient cannot take ibuprofen given risk with age and cannot take acetaminophen due to hepatic cirrhosis.  Chronic back pain managed with tramadol up to 100 mg [name] maximum of twice daily as needed for pain.  PDMP reviewed, no aberrancies.  Controlled substance contract updated today.  Will continue to monitor.  Orders: -     traMADol HCl; Take 2 tablets (100 mg total) by mouth 2 (two) times daily.  Dispense: 120 tablet; Refill: 3  Secondary biliary cirrhosis (HCC) Assessment & Plan: Followed by [name] with [facility].  Per endocrinology 04/17/2023: Angie has Angie [name], compensated hepatitis C  induced cirrhosis. She completed treatment with capecitabine in September 2023 after resection of her tumor by [name]. She is followed by medical oncology. She continues to be compensated from [name] liver perspective. She is doing well off chemotherapy and reports improvement in her energy and appetite. Labs were drawn March 07, 2023 and reviewed. MELD remains low, labs in July 2024.   She is immune to hepatitis [name]; she received her first hepatis B vaccine on 12/20/21, and second on 05/25/23.    Screening  for viral disease -     HIV Antibody (routine testing w rflx); Future  Obesity (BMI 30.0-34.9) Assessment & Plan: Tolerating phentermine well with no side effects.   Starting Weight: 185 lbs  Current weight: 167 lbs Previous weight: 173 lbs Change in weight: -6 lbs from last, 18 lbs total Dietary goals: high protein, portion control Exercise goals: walking routine Medication: continue phentermine 37.5 mg daily Follow-up and referrals: 3 months follow up  Orders: -     Phentermine HCl; Take 1 tablet (37.5 mg total) by mouth daily before breakfast.  Dispense: 30 tablet; Refill: 2    Return in about 3 months (around 07/28/2023) for follow-up for HTN, weight management, back pain.    Angie Quitter, PA

## 2023-04-28 NOTE — Assessment & Plan Note (Signed)
BP goal <140/90.  Stable, at goal.  Continue lisinopril-hydrochlorothiazide 10-12.5 mg daily.  CBC and CMP ordered with [facility] available in care everywhere, both within normal limits.  Will continue to monitor.

## 2023-04-28 NOTE — Assessment & Plan Note (Addendum)
Lipid panel has not been collected since 2022.  We will collect prior to next appointment.

## 2023-05-03 ENCOUNTER — Telehealth: Payer: Self-pay

## 2023-05-03 DIAGNOSIS — E782 Mixed hyperlipidemia: Secondary | ICD-10-CM

## 2023-05-03 DIAGNOSIS — Z1159 Encounter for screening for other viral diseases: Secondary | ICD-10-CM

## 2023-05-03 NOTE — Telephone Encounter (Signed)
Pt is requesting to have future labs done at [facility].

## 2023-05-03 NOTE — Telephone Encounter (Signed)
I reordered the labs and listed the resulting agency as [facility].  If this was done incorrectly, they should be able to review which labs should be collected or I can reorder them.

## 2023-05-08 ENCOUNTER — Encounter: Payer: Self-pay | Admitting: Family Medicine

## 2023-05-30 DIAGNOSIS — M1812 Unilateral primary osteoarthritis of first carpometacarpal joint, left hand: Secondary | ICD-10-CM | POA: Diagnosis not present

## 2023-05-30 DIAGNOSIS — M1811 Unilateral primary osteoarthritis of first carpometacarpal joint, right hand: Secondary | ICD-10-CM | POA: Diagnosis not present

## 2023-06-01 ENCOUNTER — Other Ambulatory Visit: Payer: Self-pay | Admitting: Family Medicine

## 2023-06-01 ENCOUNTER — Other Ambulatory Visit
Admission: RE | Admit: 2023-06-01 | Discharge: 2023-06-01 | Disposition: A | Discharge status: Home / Self Care | Payer: 59 | Source: Ambulatory Visit | Attending: Family Medicine | Admitting: Family Medicine

## 2023-06-01 DIAGNOSIS — R7303 Prediabetes: Secondary | ICD-10-CM | POA: Insufficient documentation

## 2023-06-01 DIAGNOSIS — I10 Essential (primary) hypertension: Secondary | ICD-10-CM | POA: Diagnosis not present

## 2023-06-01 DIAGNOSIS — Z1159 Encounter for screening for other viral diseases: Secondary | ICD-10-CM

## 2023-06-01 DIAGNOSIS — Z6827 Body mass index (BMI) 27.0-27.9, adult: Secondary | ICD-10-CM | POA: Insufficient documentation

## 2023-06-01 DIAGNOSIS — E042 Nontoxic multinodular goiter: Secondary | ICD-10-CM | POA: Diagnosis not present

## 2023-06-01 DIAGNOSIS — E782 Mixed hyperlipidemia: Secondary | ICD-10-CM

## 2023-06-01 DIAGNOSIS — E559 Vitamin D deficiency, unspecified: Secondary | ICD-10-CM

## 2023-06-01 DIAGNOSIS — E663 Overweight: Secondary | ICD-10-CM | POA: Diagnosis not present

## 2023-06-01 LAB — COMPREHENSIVE METABOLIC PANEL
ALT: 43 U/L (ref 0–44)
AST: 36 U/L (ref 15–41)
Albumin: 4.1 g/dL (ref 3.5–5.0)
Alkaline Phosphatase: 63 U/L (ref 38–126)
Anion gap: 9 (ref 5–15)
BUN: 21 mg/dL (ref 8–23)
CO2: 24 mmol/L (ref 22–32)
Calcium: 8.9 mg/dL (ref 8.9–10.3)
Chloride: 103 mmol/L (ref 98–111)
Creatinine, Ser: 0.6 mg/dL (ref 0.44–1.00)
GFR, Estimated: >60 mL/min (ref >60)
Glucose, Bld: 111 mg/dL — ABNORMAL HIGH (ref 70–99)
Potassium: 3.9 mmol/L (ref 3.5–5.1)
Sodium: 136 mmol/L (ref 135–145)
Total Bilirubin: 0.6 mg/dL (ref 0.3–1.2)
Total Protein: 7.7 g/dL (ref 6.5–8.1)

## 2023-06-01 LAB — CBC WITH DIFFERENTIAL/PLATELET
Abs Immature Granulocytes: 0.06 10*3/uL (ref 0.00–0.07)
Basophils Absolute: 0 10*3/uL (ref 0.0–0.1)
Basophils Relative: 0 %
Eosinophils Absolute: 0 10*3/uL (ref 0.0–0.5)
Eosinophils Relative: 0 %
HCT: 38.9 % (ref 36.0–46.0)
Hemoglobin: 13.1 g/dL (ref 12.0–15.0)
Immature Granulocytes: 1 %
Lymphocytes Relative: 30 %
Lymphs Abs: 2.1 10*3/uL (ref 0.7–4.0)
MCH: 31.2 pg (ref 26.0–34.0)
MCHC: 33.7 g/dL (ref 30.0–36.0)
MCV: 92.6 fL (ref 80.0–100.0)
Monocytes Absolute: 0.6 10*3/uL (ref 0.1–1.0)
Monocytes Relative: 9 %
Neutro Abs: 4.1 10*3/uL (ref 1.7–7.7)
Neutrophils Relative %: 60 %
Platelets: 175 10*3/uL (ref 150–400)
RBC: 4.2 MIL/uL (ref 3.87–5.11)
RDW: 13.1 % (ref 11.5–15.5)
WBC: 6.9 10*3/uL (ref 4.0–10.5)
nRBC: 0 % (ref 0.0–0.2)

## 2023-06-01 LAB — TSH: TSH: 1.451 u[IU]/mL (ref 0.350–4.500)

## 2023-06-01 LAB — HEMOGLOBIN A1C
Hgb A1c MFr Bld: 5.9 % — ABNORMAL HIGH (ref 4.8–5.6)
Mean Plasma Glucose: 122.63 mg/dL

## 2023-06-01 LAB — LIPID PANEL
Cholesterol: 255 mg/dL — ABNORMAL HIGH (ref 0–200)
HDL: 86 mg/dL (ref >40)
LDL Cholesterol: 137 mg/dL — ABNORMAL HIGH (ref 0–99)
Total CHOL/HDL Ratio: 3 {ratio}
Triglycerides: 159 mg/dL — ABNORMAL HIGH (ref <150)
VLDL: 32 mg/dL (ref 0–40)

## 2023-06-01 LAB — VITAMIN D 25 HYDROXY (VIT D DEFICIENCY, FRACTURES): Vit D, 25-Hydroxy: 33.78 ng/mL (ref 30–100)

## 2023-06-01 LAB — T4, FREE: Free T4: 0.89 ng/dL (ref 0.61–1.12)

## 2023-06-01 LAB — HEPATITIS C ANTIBODY: HCV Ab: REACTIVE — AB

## 2023-06-06 DIAGNOSIS — R7303 Prediabetes: Secondary | ICD-10-CM | POA: Insufficient documentation

## 2023-06-19 DIAGNOSIS — K432 Incisional hernia without obstruction or gangrene: Secondary | ICD-10-CM | POA: Insufficient documentation

## 2023-06-20 DIAGNOSIS — K7460 Unspecified cirrhosis of liver: Secondary | ICD-10-CM | POA: Diagnosis not present

## 2023-06-20 DIAGNOSIS — D696 Thrombocytopenia, unspecified: Secondary | ICD-10-CM | POA: Diagnosis not present

## 2023-06-20 DIAGNOSIS — Z01818 Encounter for other preprocedural examination: Secondary | ICD-10-CM | POA: Diagnosis not present

## 2023-06-20 DIAGNOSIS — C220 Liver cell carcinoma: Secondary | ICD-10-CM | POA: Diagnosis not present

## 2023-06-20 DIAGNOSIS — K432 Incisional hernia without obstruction or gangrene: Secondary | ICD-10-CM | POA: Diagnosis not present

## 2023-06-20 DIAGNOSIS — I10 Essential (primary) hypertension: Secondary | ICD-10-CM | POA: Diagnosis not present

## 2023-06-20 DIAGNOSIS — D649 Anemia, unspecified: Secondary | ICD-10-CM | POA: Diagnosis not present

## 2023-06-22 ENCOUNTER — Encounter: Payer: Self-pay | Admitting: Nurse Practitioner

## 2023-07-04 DIAGNOSIS — C221 Intrahepatic bile duct carcinoma: Secondary | ICD-10-CM | POA: Diagnosis not present

## 2023-07-04 DIAGNOSIS — C220 Liver cell carcinoma: Secondary | ICD-10-CM | POA: Diagnosis not present

## 2023-07-06 DIAGNOSIS — Z886 Allergy status to analgesic agent status: Secondary | ICD-10-CM | POA: Diagnosis not present

## 2023-07-06 DIAGNOSIS — Z79899 Other long term (current) drug therapy: Secondary | ICD-10-CM | POA: Diagnosis not present

## 2023-07-06 DIAGNOSIS — Z87891 Personal history of nicotine dependence: Secondary | ICD-10-CM | POA: Diagnosis not present

## 2023-07-06 DIAGNOSIS — Z8505 Personal history of malignant neoplasm of liver: Secondary | ICD-10-CM | POA: Diagnosis not present

## 2023-07-06 DIAGNOSIS — Z888 Allergy status to other drugs, medicaments and biological substances status: Secondary | ICD-10-CM | POA: Diagnosis not present

## 2023-07-06 DIAGNOSIS — K432 Incisional hernia without obstruction or gangrene: Secondary | ICD-10-CM | POA: Diagnosis not present

## 2023-07-06 DIAGNOSIS — K660 Peritoneal adhesions (postprocedural) (postinfection): Secondary | ICD-10-CM | POA: Diagnosis not present

## 2023-07-06 DIAGNOSIS — G8918 Other acute postprocedural pain: Secondary | ICD-10-CM | POA: Diagnosis not present

## 2023-07-06 HISTORY — PX: HERNIA REPAIR: SHX51

## 2023-07-11 DIAGNOSIS — I10 Essential (primary) hypertension: Secondary | ICD-10-CM | POA: Diagnosis not present

## 2023-07-11 DIAGNOSIS — Z48815 Encounter for surgical aftercare following surgery on the digestive system: Secondary | ICD-10-CM | POA: Diagnosis not present

## 2023-07-11 DIAGNOSIS — Z9089 Acquired absence of other organs: Secondary | ICD-10-CM | POA: Diagnosis not present

## 2023-07-11 DIAGNOSIS — K7460 Unspecified cirrhosis of liver: Secondary | ICD-10-CM | POA: Diagnosis not present

## 2023-07-13 DIAGNOSIS — Z48815 Encounter for surgical aftercare following surgery on the digestive system: Secondary | ICD-10-CM | POA: Diagnosis not present

## 2023-07-13 DIAGNOSIS — I10 Essential (primary) hypertension: Secondary | ICD-10-CM | POA: Diagnosis not present

## 2023-07-13 DIAGNOSIS — Z9089 Acquired absence of other organs: Secondary | ICD-10-CM | POA: Diagnosis not present

## 2023-07-13 DIAGNOSIS — K7460 Unspecified cirrhosis of liver: Secondary | ICD-10-CM | POA: Diagnosis not present

## 2023-07-17 ENCOUNTER — Telehealth: Payer: Self-pay

## 2023-07-17 DIAGNOSIS — Z9089 Acquired absence of other organs: Secondary | ICD-10-CM | POA: Diagnosis not present

## 2023-07-17 DIAGNOSIS — K7460 Unspecified cirrhosis of liver: Secondary | ICD-10-CM | POA: Diagnosis not present

## 2023-07-17 DIAGNOSIS — Z48815 Encounter for surgical aftercare following surgery on the digestive system: Secondary | ICD-10-CM | POA: Diagnosis not present

## 2023-07-17 DIAGNOSIS — I10 Essential (primary) hypertension: Secondary | ICD-10-CM | POA: Diagnosis not present

## 2023-07-17 NOTE — Telephone Encounter (Signed)
Provider notified

## 2023-07-25 DIAGNOSIS — Z48815 Encounter for surgical aftercare following surgery on the digestive system: Secondary | ICD-10-CM | POA: Diagnosis not present

## 2023-07-25 DIAGNOSIS — K7460 Unspecified cirrhosis of liver: Secondary | ICD-10-CM | POA: Diagnosis not present

## 2023-07-25 DIAGNOSIS — I10 Essential (primary) hypertension: Secondary | ICD-10-CM | POA: Diagnosis not present

## 2023-07-25 DIAGNOSIS — Z9089 Acquired absence of other organs: Secondary | ICD-10-CM | POA: Diagnosis not present

## 2023-07-26 ENCOUNTER — Other Ambulatory Visit: Payer: 59

## 2023-08-01 DIAGNOSIS — K7460 Unspecified cirrhosis of liver: Secondary | ICD-10-CM | POA: Diagnosis not present

## 2023-08-01 DIAGNOSIS — Z9089 Acquired absence of other organs: Secondary | ICD-10-CM | POA: Diagnosis not present

## 2023-08-01 DIAGNOSIS — Z48815 Encounter for surgical aftercare following surgery on the digestive system: Secondary | ICD-10-CM | POA: Diagnosis not present

## 2023-08-01 DIAGNOSIS — I10 Essential (primary) hypertension: Secondary | ICD-10-CM | POA: Diagnosis not present

## 2023-08-02 ENCOUNTER — Encounter: Payer: Self-pay | Admitting: Family Medicine

## 2023-08-02 ENCOUNTER — Ambulatory Visit (INDEPENDENT_AMBULATORY_CARE_PROVIDER_SITE_OTHER): Payer: 59 | Admitting: Family Medicine

## 2023-08-02 VITALS — BP 139/85 | HR 81 | Resp 18 | Ht 65.0 in | Wt 168.0 lb

## 2023-08-02 DIAGNOSIS — Z780 Asymptomatic menopausal state: Secondary | ICD-10-CM

## 2023-08-02 DIAGNOSIS — Z1231 Encounter for screening mammogram for malignant neoplasm of breast: Secondary | ICD-10-CM | POA: Diagnosis not present

## 2023-08-02 DIAGNOSIS — G8929 Other chronic pain: Secondary | ICD-10-CM | POA: Diagnosis not present

## 2023-08-02 DIAGNOSIS — M5442 Lumbago with sciatica, left side: Secondary | ICD-10-CM | POA: Diagnosis not present

## 2023-08-02 DIAGNOSIS — M5441 Lumbago with sciatica, right side: Secondary | ICD-10-CM | POA: Diagnosis not present

## 2023-08-02 DIAGNOSIS — E66811 Obesity, class 1: Secondary | ICD-10-CM

## 2023-08-02 DIAGNOSIS — I10 Essential (primary) hypertension: Secondary | ICD-10-CM

## 2023-08-02 MED ORDER — PHENTERMINE HCL 37.5 MG PO TABS: 37.5000 mg | ORAL_TABLET | Freq: Every day | ORAL | 2 refills | Status: DC

## 2023-08-02 MED ORDER — ONDANSETRON HCL 8 MG PO TABS: 8.0000 mg | ORAL_TABLET | Freq: Three times a day (TID) | ORAL | 3 refills | Status: DC | PRN

## 2023-08-02 NOTE — Progress Notes (Signed)
Established Patient Office Visit  Subjective   Patient ID: Angie [name], female    DOB: 04/16/1956  Age: 67 y.o. MRN: [id number]  Chief Complaint  Patient presents with   Hypertension   Back Pain   Weight Management Screening    HPI Angie [name] is a 67 y.o. female presenting today for follow up of hypertension, weight management, back pain.  She is also about 4 weeks s/p hernia repair.  Overall recovery has gone really well, she did not even need to take the pain medications prescribed by her surgeon. Hypertension: Patient here for follow-up of elevated blood pressure.  Pt denies chest pain, SOB, dizziness, edema, syncope, fatigue or heart palpitations. Taking lisinopril-hydrochlorothiazide, reports excellent compliance with treatment. Denies side effects. Weight management: Weight has remained stable since last visit.  She is currently taking phentermine. Back pain: Remains well-controlled with tramadol.  Outpatient Medications Prior to Visit  Medication Sig   Biotin 1 MG CAPS Take 1,000 mcg by mouth daily.   cetirizine (ZYRTEC ALLERGY) 10 MG tablet Take 1 tablet (10 mg total) by mouth daily.   fluticasone (FLONASE) 50 MCG/ACT nasal spray Place 2 sprays into both nostrils daily.   lisinopril-hydrochlorothiazide (ZESTORETIC) 10-12.5 MG tablet Take 1 tablet by mouth daily.   pantoprazole (PROTONIX) 40 MG tablet Take 1 tablet (40 mg total) by mouth daily.   prochlorperazine (COMPAZINE) 10 MG tablet Take by mouth.   traMADol (ULTRAM) 50 MG tablet Take 2 tablets (100 mg total) by mouth 2 (two) times daily.   [DISCONTINUED] ondansetron (ZOFRAN) 8 MG tablet Take by mouth.   [DISCONTINUED] phentermine (ADIPEX-P) 37.5 MG tablet Take 1 tablet (37.5 mg total) by mouth daily before breakfast.   No facility-administered medications prior to visit.    ROS Negative unless otherwise noted in HPI   Objective:     BP 139/85 (BP Location: Left Arm, Patient Position: Sitting, Cuff Size:  Normal)   Pulse 81   Resp 18   Ht 5\' 5"  (1.651 m)   Wt 168 lb (76.2 kg)   SpO2 94%   BMI 27.96 kg/m   Physical Exam Constitutional:      General: She is not in acute distress.    Appearance: Normal appearance.  HENT:     Head: Normocephalic and atraumatic.  Cardiovascular:     Rate and Rhythm: Normal rate and regular rhythm.     Heart sounds: No murmur heard.    No friction rub. No gallop.  Pulmonary:     Effort: Pulmonary effort is normal. No respiratory distress.     Breath sounds: No wheezing, rhonchi or rales.  Abdominal:     General: A surgical scar is present.     Comments: Vertical midline incision scar from approximately 2 inches below xiphoid process extending to umbilicus.  Healing well, no erythema or warmth.  Skin:    General: Skin is warm and dry.  Neurological:     Mental Status: She is alert and oriented to person, place, and time.     Assessment & Plan:  Essential hypertension Assessment & Plan: BP goal <140/90.  Stable, at goal.  Continue lisinopril-hydrochlorothiazide 10-12.5 mg daily. Will continue to monitor.   Obesity (BMI 30.0-34.9) Assessment & Plan: Tolerating phentermine well with no side effects.   Starting Weight: 185 lbs  Current weight: 168 lbs Change in weight: -17 lbs total Dietary goals: high protein, portion control Exercise goals: walking routine Medication: continue phentermine 37.5 mg daily Follow-up and referrals: 3  months follow up  Orders: -     Phentermine HCl; Take 1 tablet (37.5 mg total) by mouth daily before breakfast.  Dispense: 30 tablet; Refill: 2  Chronic low back pain with bilateral sciatica, unspecified back pain laterality Assessment & Plan: Patient cannot take ibuprofen given risk with age and cannot take acetaminophen due to hepatic cirrhosis.  Chronic back pain managed with tramadol up to 100 mg a maximum of twice daily as needed for pain.  PDMP reviewed, no aberrancies. Will continue to  monitor.   Screening mammogram for breast cancer -     3D Screening Mammogram, Left and Right; Future  Postmenopausal estrogen deficiency -     DG Bone Density; Future  Other orders -     Ondansetron HCl; Take 1 tablet (8 mg total) by mouth every 8 (eight) hours as needed for nausea or vomiting.  Dispense: 20 tablet; Refill: 3    Return in about 3 months (around 10/31/2023) for follow-up for weight management, back pain.    [name], PA

## 2023-08-02 NOTE — Assessment & Plan Note (Signed)
BP goal <140/90.  Stable, at goal.  Continue lisinopril-hydrochlorothiazide 10-12.5 mg daily. Will continue to monitor.

## 2023-08-02 NOTE — Assessment & Plan Note (Signed)
Patient cannot take ibuprofen given risk with age and cannot take acetaminophen due to hepatic cirrhosis.  Chronic back pain managed with tramadol up to 100 mg a maximum of twice daily as needed for pain.  PDMP reviewed, no aberrancies. Will continue to monitor.

## 2023-08-02 NOTE — Assessment & Plan Note (Signed)
Tolerating phentermine well with no side effects.   Starting Weight: 185 lbs  Current weight: 168 lbs Change in weight: -17 lbs total Dietary goals: high protein, portion control Exercise goals: walking routine Medication: continue phentermine 37.5 mg daily Follow-up and referrals: 3 months follow up

## 2023-08-02 NOTE — Patient Instructions (Addendum)
I have sent in the orders for an updated mammogram and bone density scan so that you we will be able to schedule them whenever is convenient for you!  They should be calling you within the next couple of weeks to find a time that works for you.  It also looks like you are due for your tetanus shot.  You can have this at your pharmacy.

## 2023-08-04 DIAGNOSIS — Z9889 Other specified postprocedural states: Secondary | ICD-10-CM | POA: Diagnosis not present

## 2023-08-04 DIAGNOSIS — Z48815 Encounter for surgical aftercare following surgery on the digestive system: Secondary | ICD-10-CM | POA: Diagnosis not present

## 2023-08-04 DIAGNOSIS — Z8719 Personal history of other diseases of the digestive system: Secondary | ICD-10-CM | POA: Diagnosis not present

## 2023-09-28 ENCOUNTER — Encounter: Payer: Self-pay | Admitting: Family Medicine

## 2023-10-03 ENCOUNTER — Other Ambulatory Visit: Payer: Self-pay | Admitting: Family Medicine

## 2023-10-03 DIAGNOSIS — K219 Gastro-esophageal reflux disease without esophagitis: Secondary | ICD-10-CM

## 2023-11-14 ENCOUNTER — Other Ambulatory Visit: Payer: Self-pay | Admitting: Family Medicine

## 2023-11-14 DIAGNOSIS — G8929 Other chronic pain: Secondary | ICD-10-CM

## 2023-11-16 ENCOUNTER — Ambulatory Visit: Payer: 59 | Admitting: Family Medicine

## 2023-12-06 ENCOUNTER — Ambulatory Visit: Payer: Self-pay | Admitting: Family Medicine

## 2023-12-11 ENCOUNTER — Other Ambulatory Visit: Payer: Self-pay | Admitting: Family Medicine

## 2023-12-11 DIAGNOSIS — G8929 Other chronic pain: Secondary | ICD-10-CM

## 2023-12-11 DIAGNOSIS — E66811 Obesity, class 1: Secondary | ICD-10-CM

## 2024-01-03 ENCOUNTER — Other Ambulatory Visit: Payer: Self-pay | Admitting: Family Medicine

## 2024-01-03 DIAGNOSIS — K219 Gastro-esophageal reflux disease without esophagitis: Secondary | ICD-10-CM

## 2024-01-14 ENCOUNTER — Other Ambulatory Visit: Payer: Self-pay | Admitting: Family Medicine

## 2024-01-14 DIAGNOSIS — I10 Essential (primary) hypertension: Secondary | ICD-10-CM

## 2024-01-14 DIAGNOSIS — G8929 Other chronic pain: Secondary | ICD-10-CM

## 2024-01-17 ENCOUNTER — Other Ambulatory Visit: Payer: Self-pay | Admitting: *Deleted

## 2024-01-17 DIAGNOSIS — I10 Essential (primary) hypertension: Secondary | ICD-10-CM

## 2024-01-17 DIAGNOSIS — E782 Mixed hyperlipidemia: Secondary | ICD-10-CM

## 2024-01-17 DIAGNOSIS — E559 Vitamin D deficiency, unspecified: Secondary | ICD-10-CM

## 2024-01-17 DIAGNOSIS — R7303 Prediabetes: Secondary | ICD-10-CM

## 2024-02-09 ENCOUNTER — Other Ambulatory Visit: Admission: RE | Admit: 2024-02-09 | Discharge: 2024-02-09 | Disposition: A | Discharge status: Home / Self Care

## 2024-02-09 DIAGNOSIS — I10 Essential (primary) hypertension: Secondary | ICD-10-CM | POA: Diagnosis not present

## 2024-02-09 DIAGNOSIS — E782 Mixed hyperlipidemia: Secondary | ICD-10-CM | POA: Diagnosis not present

## 2024-02-09 DIAGNOSIS — R7303 Prediabetes: Secondary | ICD-10-CM | POA: Insufficient documentation

## 2024-02-09 DIAGNOSIS — E559 Vitamin D deficiency, unspecified: Secondary | ICD-10-CM | POA: Insufficient documentation

## 2024-02-09 LAB — COMPREHENSIVE METABOLIC PANEL WITH GFR
ALT: 42 U/L (ref 0–44)
AST: 37 U/L (ref 15–41)
Albumin: 4.1 g/dL (ref 3.5–5.0)
Alkaline Phosphatase: 61 U/L (ref 38–126)
Anion gap: 8 (ref 5–15)
BUN: 21 mg/dL (ref 8–23)
CO2: 26 mmol/L (ref 22–32)
Calcium: 9.4 mg/dL (ref 8.9–10.3)
Chloride: 105 mmol/L (ref 98–111)
Creatinine, Ser: 0.64 mg/dL (ref 0.44–1.00)
GFR, Estimated: >60 mL/min (ref >60)
Glucose, Bld: 109 mg/dL — ABNORMAL HIGH (ref 70–99)
Potassium: 4.2 mmol/L (ref 3.5–5.1)
Sodium: 139 mmol/L (ref 135–145)
Total Bilirubin: 0.9 mg/dL (ref 0.0–1.2)
Total Protein: 7.5 g/dL (ref 6.5–8.1)

## 2024-02-09 LAB — CBC WITH DIFFERENTIAL/PLATELET
Abs Immature Granulocytes: 0.02 10*3/uL (ref 0.00–0.07)
Basophils Absolute: 0 10*3/uL (ref 0.0–0.1)
Basophils Relative: 1 %
Eosinophils Absolute: 0.2 10*3/uL (ref 0.0–0.5)
Eosinophils Relative: 5 %
HCT: 40.1 % (ref 36.0–46.0)
Hemoglobin: 13.4 g/dL (ref 12.0–15.0)
Immature Granulocytes: 0 %
Lymphocytes Relative: 30 %
Lymphs Abs: 1.4 10*3/uL (ref 0.7–4.0)
MCH: 30.6 pg (ref 26.0–34.0)
MCHC: 33.4 g/dL (ref 30.0–36.0)
MCV: 91.6 fL (ref 80.0–100.0)
Monocytes Absolute: 0.5 10*3/uL (ref 0.1–1.0)
Monocytes Relative: 11 %
Neutro Abs: 2.5 10*3/uL (ref 1.7–7.7)
Neutrophils Relative %: 53 %
Platelets: 175 10*3/uL (ref 150–400)
RBC: 4.38 MIL/uL (ref 3.87–5.11)
RDW: 13.2 % (ref 11.5–15.5)
WBC: 4.6 10*3/uL (ref 4.0–10.5)
nRBC: 0 % (ref 0.0–0.2)

## 2024-02-09 LAB — TSH: TSH: 3.215 u[IU]/mL (ref 0.350–4.500)

## 2024-02-09 LAB — LIPID PANEL
Cholesterol: 236 mg/dL — ABNORMAL HIGH (ref 0–200)
HDL: 74 mg/dL (ref >40)
LDL Cholesterol: 121 mg/dL — ABNORMAL HIGH (ref 0–99)
Total CHOL/HDL Ratio: 3.2 ratio
Triglycerides: 204 mg/dL — ABNORMAL HIGH (ref <150)
VLDL: 41 mg/dL — ABNORMAL HIGH (ref 0–40)

## 2024-02-09 LAB — HEMOGLOBIN A1C
Hgb A1c MFr Bld: 5.8 % — ABNORMAL HIGH (ref 4.8–5.6)
Mean Plasma Glucose: 119.76 mg/dL

## 2024-02-09 LAB — VITAMIN D 25 HYDROXY (VIT D DEFICIENCY, FRACTURES): Vit D, 25-Hydroxy: 38.49 ng/mL (ref 30–100)

## 2024-02-12 ENCOUNTER — Ambulatory Visit: Payer: Self-pay

## 2024-02-12 DIAGNOSIS — E66811 Obesity, class 1: Secondary | ICD-10-CM

## 2024-02-16 ENCOUNTER — Ambulatory Visit (INDEPENDENT_AMBULATORY_CARE_PROVIDER_SITE_OTHER)

## 2024-02-16 ENCOUNTER — Other Ambulatory Visit: Payer: Self-pay

## 2024-02-16 ENCOUNTER — Other Ambulatory Visit: Payer: Self-pay | Admitting: Family Medicine

## 2024-02-16 VITALS — BP 135/78 | HR 104 | Temp 97.4°F | Ht 65.0 in | Wt 171.1 lb

## 2024-02-16 DIAGNOSIS — E66811 Obesity, class 1: Secondary | ICD-10-CM

## 2024-02-16 DIAGNOSIS — Z780 Asymptomatic menopausal state: Secondary | ICD-10-CM | POA: Diagnosis not present

## 2024-02-16 DIAGNOSIS — I10 Essential (primary) hypertension: Secondary | ICD-10-CM

## 2024-02-16 DIAGNOSIS — E782 Mixed hyperlipidemia: Secondary | ICD-10-CM

## 2024-02-16 DIAGNOSIS — E559 Vitamin D deficiency, unspecified: Secondary | ICD-10-CM

## 2024-02-16 DIAGNOSIS — G8929 Other chronic pain: Secondary | ICD-10-CM

## 2024-02-16 DIAGNOSIS — K219 Gastro-esophageal reflux disease without esophagitis: Secondary | ICD-10-CM

## 2024-02-16 DIAGNOSIS — Z1231 Encounter for screening mammogram for malignant neoplasm of breast: Secondary | ICD-10-CM

## 2024-02-16 MED ORDER — PANTOPRAZOLE SODIUM 40 MG PO TBEC: 40.0000 mg | DELAYED_RELEASE_TABLET | Freq: Every day | ORAL | 0 refills | Status: DC

## 2024-02-16 MED ORDER — LISINOPRIL-HYDROCHLOROTHIAZIDE 10-12.5 MG PO TABS: 1.0000 | ORAL_TABLET | Freq: Every day | ORAL | 0 refills | Status: DC

## 2024-02-16 MED ORDER — BUPROPION HCL ER (XL) 150 MG PO TB24: 150.0000 mg | ORAL_TABLET | Freq: Every day | ORAL | 3 refills | Status: DC

## 2024-02-16 NOTE — Assessment & Plan Note (Signed)
 Last lipid panel: LDL 121, HDL 74, triglycerides 204. The 10-year ASCVD risk score (Arnett DK, et al., 2019) is: 8.9% Given her age, ASCVD risk or less than 10, absence of diabetes, not indicated to initiate statin therapy at this time.  Encourage patient to focus on a low-fat diet and avoid foods high in saturated/trans fats.  Expect that with starting new medication for weight loss today that lipid panel will also improve.  Will continue to monitor.

## 2024-02-16 NOTE — Assessment & Plan Note (Signed)
 Has discontinued phentermine  due to ineffectiveness at highest dose. Starting weight: 185 Lowest weight: 168 Current weight: 171  Have discontinued phentermine  today due to ineffectiveness.  Discussed other medication options including Wellbutrin and GLP-1's.  Patient opted to try oral medication first before trying injections.  Patient denies personal history of seizures.  Start Wellbutrin 150 mg XR daily to support weight loss.  Encourage patient to continue focusing on a high-protein, low-fat diet.  Encouraged her to continue her walking routine to support weight loss.  Will follow-up in 3 months and as long as patient is tolerating medication well, can increase Wellbutrin from 150 mg to 300 mg.

## 2024-02-16 NOTE — Progress Notes (Signed)
 Complete physical exam  Patient: Angie [name]   DOB: Sep 28, 1955   68 y.o. Female  MRN: [id number]  Subjective:    Chief Complaint  Patient presents with   Annual Exam    Angie [name] is a 68 y.o. female who presents today for a complete physical exam. She reports consuming a low fat diet. Patient exercises several times weekly by walking outside for at least 30 minutes. She generally feels well. She reports sleeping well. She does have additional problems to discuss today.   Patient would also like to discuss other medication assisted options for weight loss.  She was previously taking phentermine  37.5 mg, however she reports that this medication stopped working for her and was no longer suppressing her appetite.  Since stopping the medication she reports that she has gained some of the weight back.  She is continuing to focus on a high-protein, low-fat diet.  She is exercising more by walking in the evenings.  She would like to discuss other options to assist with weight loss today.    Most recent fall risk assessment:    02/16/2024   10:42 AM  Fall Risk   Falls in the past year? 0  Risk for fall due to : No Fall Risks  Follow up Falls evaluation completed     Most recent depression screenings:    02/16/2024   10:42 AM 01/18/2023    4:14 PM  PHQ 2/9 Scores  PHQ - 2 Score 0 0  PHQ- 9 Score 0         Patient Care Team: Gayle Saddie JULIANNA DEVONNA as PCP - General (Physician Assistant)   Outpatient Medications Prior to Visit  Medication Sig   Biotin 1 MG CAPS Take 1,000 mcg by mouth daily.   lisinopril -hydrochlorothiazide  (ZESTORETIC ) 10-12.5 MG tablet Take 1 tablet by mouth once daily   ondansetron  (ZOFRAN ) 8 MG tablet TAKE 1 TABLET BY MOUTH EVERY 8 HOURS AS NEEDED FOR NAUSEA OR VOMITING   pantoprazole  (PROTONIX ) 40 MG tablet Take 1 tablet by mouth once daily   phentermine  (ADIPEX-P ) 37.5 MG tablet TAKE 1 TABLET BY MOUTH ONCE DAILY BEFORE BREAKFAST   prochlorperazine  (COMPAZINE) 10 MG tablet Take by mouth.   traMADol  (ULTRAM ) 50 MG tablet Take 2 tablets by mouth twice daily   [DISCONTINUED] cetirizine  (ZYRTEC  ALLERGY) 10 MG tablet Take 1 tablet (10 mg total) by mouth daily.   [DISCONTINUED] fluticasone  (FLONASE ) 50 MCG/ACT nasal spray Place 2 sprays into both nostrils daily.   No facility-administered medications prior to visit.    ROS   As noted in HPI.     Objective:     BP 135/78 (BP Location: Left Arm, Patient Position: Sitting)   Pulse (!) 104   Temp (!) 97.4 F (36.3 C) (Oral)   Ht 5' 5 (1.651 m)   Wt 171 lb 1.3 oz (77.6 kg)   SpO2 96%   BMI 28.47 kg/m    Physical Exam   No results found for any visits on 02/16/24. Last CBC Lab Results  Component Value Date   WBC 4.6 02/09/2024   HGB 13.4 02/09/2024   HCT 40.1 02/09/2024   MCV 91.6 02/09/2024   MCH 30.6 02/09/2024   RDW 13.2 02/09/2024   PLT 175 02/09/2024   Last metabolic panel Lab Results  Component Value Date   GLUCOSE 109 (H) 02/09/2024   NA 139 02/09/2024   K 4.2 02/09/2024   CL 105 02/09/2024   CO2 26 02/09/2024  BUN 21 02/09/2024   CREATININE 0.64 02/09/2024   GFRNONAA >60 02/09/2024   CALCIUM 9.4 02/09/2024   PROT 7.5 02/09/2024   ALBUMIN 4.1 02/09/2024   BILITOT 0.9 02/09/2024   ALKPHOS 61 02/09/2024   AST 37 02/09/2024   ALT 42 02/09/2024   ANIONGAP 8 02/09/2024   Last lipids Lab Results  Component Value Date   CHOL 236 (H) 02/09/2024   HDL 74 02/09/2024   LDLCALC 121 (H) 02/09/2024   TRIG 204 (H) 02/09/2024   CHOLHDL 3.2 02/09/2024   Last hemoglobin A1c Lab Results  Component Value Date   HGBA1C 5.8 (H) 02/09/2024   Last thyroid  functions Lab Results  Component Value Date   TSH 3.215 02/09/2024   T3TOTAL 107 06/25/2020   THYROIDAB 16 04/09/2018        Assessment & Plan:    Routine Health Maintenance and Physical Exam  Health Maintenance  Topic Date Due   DTaP/Tdap/Td vaccine (2 - Tdap) 03/28/2000   DEXA scan (bone  density measurement)  Never done   COVID-19 Vaccine (6 - 2024-25 season) 04/23/2023   Mammogram  05/19/2023   Flu Shot  03/22/2024   Medicare Annual Wellness Visit  04/27/2024   Colon Cancer Screening  09/21/2031   Pneumococcal Vaccine for age over 16  Completed   Hepatitis B Vaccine  Completed   Hepatitis C Screening  Completed   Zoster (Shingles) Vaccine  Completed   HPV Vaccine  Aged Out   Meningitis B Vaccine  Aged Out    Discussed health benefits of physical activity, and encouraged her to engage in regular exercise appropriate for her age and condition.  Screening mammogram for breast cancer -     3D Screening Mammogram, Left and Right; Future  Postmenopausal estrogen deficiency -     DG Bone Density; Future  Essential hypertension Assessment & Plan: BP goal <140/90.  Stable and at goal on recheck.  Continue lisinopril -hydrochlorothiazide  10-12.5 mg daily.  Encourage patient to participate in ambulatory blood pressure monitoring and notify of consistent readings higher than goal.  Will continue to monitor.   Obesity (BMI 30.0-34.9) Assessment & Plan: Has discontinued phentermine  due to ineffectiveness at highest dose. Starting weight: 185 Lowest weight: 168 Current weight: 171  Have discontinued phentermine  today due to ineffectiveness.  Discussed other medication options including Wellbutrin and GLP-1's.  Patient opted to try oral medication first before trying injections.  Patient denies personal history of seizures.  Start Wellbutrin 150 mg XR daily to support weight loss.  Encourage patient to continue focusing on a high-protein, low-fat diet.  Encouraged her to continue her walking routine to support weight loss.  Will follow-up in 3 months and as long as patient is tolerating medication well, can increase Wellbutrin from 150 mg to 300 mg.   Vitamin D  deficiency Assessment & Plan: Most recent vitamin D  within normal limits.  Continue daily over-the-counter vitamin D   supplement.  Will continue to monitor.   Mixed hyperlipidemia Assessment & Plan: Last lipid panel: LDL 121, HDL 74, triglycerides 204. The 10-year ASCVD risk score (Arnett DK, et al., 2019) is: 8.9% Given her age, ASCVD risk or less than 10, absence of diabetes, not indicated to initiate statin therapy at this time.  Encourage patient to focus on a low-fat diet and avoid foods high in saturated/trans fats.  Expect that with starting new medication for weight loss today that lipid panel will also improve.  Will continue to monitor.   Other orders -  buPROPion HCl ER (XL); Take 1 tablet (150 mg total) by mouth daily.  Dispense: 30 tablet; Refill: 3    Return in about 3 months (around 05/18/2024) for Weight, HTN.     Saddie JULIANNA Sacks, PA-C

## 2024-02-16 NOTE — Assessment & Plan Note (Signed)
 BP goal <140/90.  Stable and at goal on recheck.  Continue lisinopril -hydrochlorothiazide  10-12.5 mg daily.  Encourage patient to participate in ambulatory blood pressure monitoring and notify of consistent readings higher than goal.  Will continue to monitor.

## 2024-02-16 NOTE — Patient Instructions (Signed)
 It was nice to see you today!  As we discussed in clinic:  -Continue all of your current medications as prescribed. Please keep an eye on blood pressure at home and notify me if it is consistently running >130/80 at home.  -I have sent in Wellbutrin 150 mg XR to take once daily for weight loss. Please let me know if you experience chest pain or palpitations on this medication. It can also cause agitation and jitteriness as side effects.  -I have placed the referrals for mammogram and DEXA scan. They will call you to schedule this.   -I will plan on seeing you back in 3 months for follow up on weight!  It was nice to meet you!  If you have any problems before your next visit feel free to message me via MyChart (minor issues or questions) or call the office, otherwise you may reach out to schedule an office visit.  Thank you! Saddie Sacks, PA-C

## 2024-02-16 NOTE — Assessment & Plan Note (Signed)
 Most recent vitamin D  within normal limits.  Continue daily over-the-counter vitamin D  supplement.  Will continue to monitor.

## 2024-02-16 NOTE — Addendum Note (Signed)
 Addended byBETHA GAYLE NUMBERS on: 02/16/2024 12:14 PM   Modules accepted: Orders

## 2024-02-19 ENCOUNTER — Other Ambulatory Visit: Payer: Self-pay | Admitting: Family Medicine

## 2024-02-19 DIAGNOSIS — G8929 Other chronic pain: Secondary | ICD-10-CM

## 2024-02-19 NOTE — Telephone Encounter (Signed)
PDMP reviewed, no aberrancies

## 2024-02-20 ENCOUNTER — Other Ambulatory Visit: Payer: Self-pay

## 2024-02-20 MED ORDER — ONDANSETRON HCL 8 MG PO TABS: 8.0000 mg | ORAL_TABLET | Freq: Three times a day (TID) | ORAL | 0 refills | Status: DC | PRN

## 2024-02-20 MED ORDER — TRAMADOL HCL 50 MG PO TABS: 100.0000 mg | ORAL_TABLET | Freq: Two times a day (BID) | ORAL | 0 refills | Status: DC

## 2024-03-06 MED ORDER — BUPROPION HCL ER (XL) 300 MG PO TB24: 300.0000 mg | ORAL_TABLET | Freq: Every day | ORAL | 3 refills | Status: DC

## 2024-03-18 ENCOUNTER — Other Ambulatory Visit: Payer: Self-pay

## 2024-03-18 DIAGNOSIS — G8929 Other chronic pain: Secondary | ICD-10-CM

## 2024-03-21 ENCOUNTER — Telehealth: Payer: Self-pay

## 2024-03-21 NOTE — Telephone Encounter (Signed)
 Pt has been made aware Disability parking placard app is ready for pickup. Pt will be coming to pick up.

## 2024-03-27 ENCOUNTER — Other Ambulatory Visit: Payer: Self-pay

## 2024-03-27 DIAGNOSIS — N644 Mastodynia: Secondary | ICD-10-CM

## 2024-03-27 DIAGNOSIS — Z1231 Encounter for screening mammogram for malignant neoplasm of breast: Secondary | ICD-10-CM

## 2024-03-27 DIAGNOSIS — R928 Other abnormal and inconclusive findings on diagnostic imaging of breast: Secondary | ICD-10-CM

## 2024-04-16 ENCOUNTER — Other Ambulatory Visit: Payer: Self-pay

## 2024-04-16 DIAGNOSIS — G8929 Other chronic pain: Secondary | ICD-10-CM

## 2024-04-17 ENCOUNTER — Other Ambulatory Visit: Payer: Self-pay

## 2024-04-17 DIAGNOSIS — G8929 Other chronic pain: Secondary | ICD-10-CM

## 2024-04-17 MED ORDER — TRAMADOL HCL 50 MG PO TABS: 100.0000 mg | ORAL_TABLET | Freq: Two times a day (BID) | ORAL | 0 refills | Status: DC

## 2024-04-30 ENCOUNTER — Ambulatory Visit
Admission: RE | Admit: 2024-04-30 | Discharge: 2024-04-30 | Disposition: A | Discharge status: Home / Self Care | Source: Ambulatory Visit

## 2024-04-30 DIAGNOSIS — Z1231 Encounter for screening mammogram for malignant neoplasm of breast: Secondary | ICD-10-CM | POA: Insufficient documentation

## 2024-04-30 DIAGNOSIS — N644 Mastodynia: Secondary | ICD-10-CM | POA: Insufficient documentation

## 2024-04-30 DIAGNOSIS — Z780 Asymptomatic menopausal state: Secondary | ICD-10-CM | POA: Insufficient documentation

## 2024-05-01 ENCOUNTER — Other Ambulatory Visit: Payer: Self-pay

## 2024-05-01 ENCOUNTER — Ambulatory Visit: Payer: Self-pay

## 2024-05-01 MED ORDER — IBANDRONATE SODIUM 150 MG PO TABS: 150.0000 mg | ORAL_TABLET | ORAL | 0 refills | Status: DC

## 2024-05-16 ENCOUNTER — Other Ambulatory Visit: Payer: Self-pay

## 2024-05-16 DIAGNOSIS — G8929 Other chronic pain: Secondary | ICD-10-CM

## 2024-05-21 ENCOUNTER — Ambulatory Visit

## 2024-05-21 VITALS — BP 116/77 | HR 91 | Temp 97.5°F | Ht 65.0 in | Wt 174.1 lb

## 2024-05-21 DIAGNOSIS — Z23 Encounter for immunization: Secondary | ICD-10-CM | POA: Diagnosis not present

## 2024-05-21 DIAGNOSIS — D693 Immune thrombocytopenic purpura: Secondary | ICD-10-CM

## 2024-05-21 DIAGNOSIS — I10 Essential (primary) hypertension: Secondary | ICD-10-CM

## 2024-05-21 DIAGNOSIS — K219 Gastro-esophageal reflux disease without esophagitis: Secondary | ICD-10-CM

## 2024-05-21 DIAGNOSIS — K7469 Other cirrhosis of liver: Secondary | ICD-10-CM

## 2024-05-21 DIAGNOSIS — E559 Vitamin D deficiency, unspecified: Secondary | ICD-10-CM

## 2024-05-21 DIAGNOSIS — M5441 Lumbago with sciatica, right side: Secondary | ICD-10-CM

## 2024-05-21 DIAGNOSIS — E119 Type 2 diabetes mellitus without complications: Secondary | ICD-10-CM | POA: Diagnosis not present

## 2024-05-21 DIAGNOSIS — C7B8 Other secondary neuroendocrine tumors: Secondary | ICD-10-CM | POA: Insufficient documentation

## 2024-05-21 DIAGNOSIS — E66811 Obesity, class 1: Secondary | ICD-10-CM | POA: Diagnosis not present

## 2024-05-21 DIAGNOSIS — M5442 Lumbago with sciatica, left side: Secondary | ICD-10-CM

## 2024-05-21 DIAGNOSIS — G8929 Other chronic pain: Secondary | ICD-10-CM

## 2024-05-21 DIAGNOSIS — E782 Mixed hyperlipidemia: Secondary | ICD-10-CM

## 2024-05-21 MED ORDER — LISINOPRIL-HYDROCHLOROTHIAZIDE 10-12.5 MG PO TABS: 1.0000 | ORAL_TABLET | Freq: Every day | ORAL | 1 refills | Status: AC

## 2024-05-21 MED ORDER — TOPIRAMATE 25 MG PO TABS: 25.0000 mg | ORAL_TABLET | Freq: Two times a day (BID) | ORAL | 2 refills | Status: DC

## 2024-05-21 MED ORDER — BUPROPION HCL ER (XL) 300 MG PO TB24: 300.0000 mg | ORAL_TABLET | Freq: Every day | ORAL | 1 refills | Status: AC

## 2024-05-21 MED ORDER — PANTOPRAZOLE SODIUM 40 MG PO TBEC: 40.0000 mg | DELAYED_RELEASE_TABLET | Freq: Every day | ORAL | 1 refills | Status: AC

## 2024-05-21 NOTE — Assessment & Plan Note (Signed)
 Last lipid panel: LDL 121, HDL 74, triglycerides 204. The 10-year ASCVD risk score (Arnett DK, et al., 2019) is: 8.9% Given her age, ASCVD risk or less than 10, absence of diabetes, not indicated to initiate statin therapy at this time.  Encourage patient to focus on a low-fat diet and avoid foods high in saturated/trans fats.  Expect that with starting new medication for weight loss today that lipid panel will also improve.  Updating lipid panel in 4 months and will adjust medication as indicated.

## 2024-05-21 NOTE — Assessment & Plan Note (Signed)
 On daily vit D and calcium supplement.

## 2024-05-21 NOTE — Assessment & Plan Note (Signed)
 Managed with Tramadol  up to 100 mg twice daily PRN for pain. Refills recently sent. PDMP reviewed, no aberrancies.

## 2024-05-21 NOTE — Assessment & Plan Note (Signed)
 Stable and well controlled with pantoprazole . Refill sent to pharmacy today.

## 2024-05-21 NOTE — Assessment & Plan Note (Signed)
 BP goal <140/90. Stable and at goal. Continue lisinopril -hydrochlorothiazide  10-12.5 mg daily. Encourage patient to participate in ambulatory blood pressure monitoring and notify of consistent readings higher than goal. Will continue to monitor.

## 2024-05-21 NOTE — Assessment & Plan Note (Signed)
 Current weight: 174  Previous weigh: 171  Starting weight: 185   Change in weight: +3 lbs Have increased Wellbutrin  to 300 mg XL daily for several weeks. Patient doing well on it and denies side effects. Given inefficacy so far, will add Topamax to augment weight loss. Start topiramate 25 mg once daily x 7 days, then increase to 25 mg BID thereafter. Will aim for a goal of 50 mg BID if tolerated well.

## 2024-05-21 NOTE — Assessment & Plan Note (Signed)
 Followed by Dr. Umberto with [facility], has upcoming appt next month.

## 2024-05-21 NOTE — Assessment & Plan Note (Signed)
 Follows with heme onc

## 2024-05-21 NOTE — Progress Notes (Signed)
 Established Patient Office Visit  Subjective   Patient ID: Angie [name], female    DOB: 1956-08-12  Age: 68 y.o. MRN: [id number]  Chief Complaint  Patient presents with   Medical Management of Chronic Issues    HPI  History of Present Illness   Angie [name] is a 68 year old female who presents for medication management and lab review.  Weight management - Currently taking Wellbutrin , initially started at a low dose to avoid side effects, increased to 300 mg daily - Takes Wellbutrin  in the morning on an empty stomach - No improvement in weight loss with Wellbutrin  - Previously tried phentermine  for weight loss, which lost effectiveness after several months of use   Medication tolerability and adverse effects - Currently taking lisinopril -hydrochlorothiazide , pantoprazole , tramadol , and a nausea medication - Recently started calcium 1200 mg and vitamin D  supplementation without gastrointestinal upset        ROS Per HPI.    Objective:     BP 116/77   Pulse 91   Temp (!) 97.5 F (36.4 C) (Oral)   Ht 5' 5 (1.651 m)   Wt 174 lb 1.3 oz (79 kg)   SpO2 95%   BMI 28.97 kg/m    Physical Exam Constitutional:      General: She is not in acute distress.    Appearance: Normal appearance.  Cardiovascular:     Rate and Rhythm: Normal rate and regular rhythm.     Heart sounds: Normal heart sounds. No murmur heard.    No friction rub. No gallop.  Pulmonary:     Effort: Pulmonary effort is normal. No respiratory distress.     Breath sounds: Normal breath sounds.  Musculoskeletal:        General: No swelling.  Skin:    General: Skin is warm and dry.  Neurological:     General: No focal deficit present.     Mental Status: She is alert.  Psychiatric:        Mood and Affect: Mood normal.        Behavior: Behavior normal.        Thought Content: Thought content normal.     No results found for any visits on 05/21/24.  Last CBC Lab Results  Component Value  Date   WBC 4.6 02/09/2024   HGB 13.4 02/09/2024   HCT 40.1 02/09/2024   MCV 91.6 02/09/2024   MCH 30.6 02/09/2024   RDW 13.2 02/09/2024   PLT 175 02/09/2024   Last metabolic panel Lab Results  Component Value Date   GLUCOSE 109 (H) 02/09/2024   NA 139 02/09/2024   K 4.2 02/09/2024   CL 105 02/09/2024   CO2 26 02/09/2024   BUN 21 02/09/2024   CREATININE 0.64 02/09/2024   GFRNONAA >60 02/09/2024   CALCIUM 9.4 02/09/2024   PROT 7.5 02/09/2024   ALBUMIN 4.1 02/09/2024   BILITOT 0.9 02/09/2024   ALKPHOS 61 02/09/2024   AST 37 02/09/2024   ALT 42 02/09/2024   ANIONGAP 8 02/09/2024   Last lipids Lab Results  Component Value Date   CHOL 236 (H) 02/09/2024   HDL 74 02/09/2024   LDLCALC 121 (H) 02/09/2024   TRIG 204 (H) 02/09/2024   CHOLHDL 3.2 02/09/2024   Last hemoglobin A1c Lab Results  Component Value Date   HGBA1C 5.8 (H) 02/09/2024   Last thyroid  functions Lab Results  Component Value Date   TSH 3.215 02/09/2024   T3TOTAL 107 06/25/2020   THYROIDAB 16  04/09/2018   Last vitamin D  Lab Results  Component Value Date   VD25OH 38.49 02/09/2024      The 10-year ASCVD risk score (Arnett DK, et al., 2019) is: 13.9%    Assessment & Plan:   Encounter for vaccination -     Tdap vaccine greater than or equal to 7yo IM  Essential hypertension Assessment & Plan: BP goal <140/90. Stable and at goal. Continue lisinopril -hydrochlorothiazide  10-12.5 mg daily. Encourage patient to participate in ambulatory blood pressure monitoring and notify of consistent readings higher than goal. Will continue to monitor.   Orders: -     Lisinopril -hydroCHLOROthiazide ; Take 1 tablet by mouth daily.  Dispense: 90 tablet; Refill: 1  Obesity (BMI 30.0-34.9) Assessment & Plan: Current weight: 174  Previous weigh: 171  Starting weight: 185   Change in weight: +3 lbs Have increased Wellbutrin  to 300 mg XL daily for several weeks. Patient doing well on it and denies side effects.  Given inefficacy so far, will add Topamax to augment weight loss. Start topiramate 25 mg once daily x 7 days, then increase to 25 mg BID thereafter. Will aim for a goal of 50 mg BID if tolerated well.  Orders: -     buPROPion  HCl ER (XL); Take 1 tablet (300 mg total) by mouth daily.  Dispense: 90 tablet; Refill: 1 -     VITAMIN D  25 Hydroxy (Vit-D Deficiency, Fractures); Future -     TSH; Future -     Hemoglobin A1c; Future -     Lipid panel; Future -     Comprehensive metabolic panel with GFR; Future -     CBC with Differential/Platelet; Future  Gastroesophageal reflux disease without esophagitis Assessment & Plan: Stable and well controlled with pantoprazole . Refill sent to pharmacy today.  Orders: -     Pantoprazole  Sodium; Take 1 tablet (40 mg total) by mouth daily.  Dispense: 90 tablet; Refill: 1  Metastatic malignant neuroendocrine tumor to liver (HCC)  Type 2 diabetes mellitus without complication, unspecified whether long term insulin use  Autoimmune thrombocytopenia (HCC) Assessment & Plan: Follows with heme/onc.   Other cirrhosis of liver [facility]) Assessment & Plan: Followed by Dr. Umberto with [facility], has upcoming appt next month.   Chronic low back pain with bilateral sciatica, unspecified back pain laterality Assessment & Plan: Managed with Tramadol  up to 100 mg twice daily PRN for pain. Refills recently sent. PDMP reviewed, no aberrancies.   Vitamin D  deficiency Assessment & Plan: On daily vit D and calcium supplement.   Mixed hyperlipidemia Assessment & Plan: Last lipid panel: LDL 121, HDL 74, triglycerides 204. The 10-year ASCVD risk score (Arnett DK, et al., 2019) is: 8.9% Given her age, ASCVD risk or less than 10, absence of diabetes, not indicated to initiate statin therapy at this time.  Encourage patient to focus on a low-fat diet and avoid foods high in saturated/trans fats.  Expect that with starting new medication for weight loss today that lipid panel  will also improve.  Updating lipid panel in 4 months and will adjust medication as indicated.   Other orders -     Topiramate; Take 1 tablet (25 mg total) by mouth 2 (two) times daily.  Dispense: 60 tablet; Refill: 2    Return in about 4 months (around 09/20/2024) for HTN, HLD, DM, Weight.    Saddie JULIANNA Sacks, PA-C

## 2024-05-21 NOTE — Patient Instructions (Signed)
 VISIT SUMMARY: Today, we reviewed your medications and lab results, and discussed your current health concerns, including weight management, hypertension, GERD, chronic pain, and anxiety about your upcoming surgical follow-up.  YOUR PLAN: -OBESITY: Obesity is a condition characterized by excessive body fat. Since Wellbutrin  at 300 mg daily has not been effective for weight loss, we will add topiramate. Start with 25 mg daily for one week, then increase to 25 mg twice daily. Continue taking Wellbutrin  300 mg daily. Monitor for side effects and take topiramate with food.  -HYPERTENSION: Hypertension is high blood pressure. Your condition is managed with lisinopril . We will refill your lisinopril  prescription.  -GASTROESOPHAGEAL REFLUX DISEASE (GERD): GERD is a digestive disorder where stomach acid irritates the food pipe lining. Your condition is managed with pantoprazole . We will refill your pantoprazole  prescription.  -CHRONIC PAIN: Chronic pain is long-standing pain that persists beyond the usual recovery period. Your condition is managed with tramadol . We will ensure your tramadol  prescription is up to date.  -NAUSEA: Nausea is a feeling of sickness with an inclination to vomit. We will ensure your nausea medication prescription is up to date.  -VITAMIN D  DEFICIENCY AND HYPOCALCEMIA: Vitamin D  deficiency and hypocalcemia are conditions where there is not enough vitamin D  and calcium in your body. Continue taking your vitamin D  and calcium supplements.  -GENERAL HEALTH MAINTENANCE: You are due for a tetanus vaccine, and a flu shot is planned for October. COVID-19 vaccine is available for those over 65. We will administer the tetanus vaccine today, schedule your flu shot for October, and discuss the COVID-19 vaccine process.  INSTRUCTIONS: Schedule a follow-up appointment for the end of January. Get blood work done 1-2 weeks before your next appointment. Coordinate with your surgeon for a  follow-up in November.  If you have any problems before your next visit feel free to message me via MyChart (minor issues or questions) or call the office, otherwise you may reach out to schedule an office visit.  Thank you! Saddie Sacks, PA-C

## 2024-06-24 ENCOUNTER — Other Ambulatory Visit: Payer: Self-pay

## 2024-06-24 DIAGNOSIS — G8929 Other chronic pain: Secondary | ICD-10-CM

## 2024-07-04 ENCOUNTER — Ambulatory Visit

## 2024-07-04 VITALS — BP 108/62 | HR 87 | Ht 65.5 in | Wt 172.0 lb

## 2024-07-04 DIAGNOSIS — Z0000 Encounter for general adult medical examination without abnormal findings: Secondary | ICD-10-CM

## 2024-07-04 NOTE — Patient Instructions (Signed)
 [name],  Thank you for taking the time for your Medicare Wellness Visit. I appreciate your continued commitment to your health goals. Please review the care plan we discussed, and feel free to reach out if I can assist you further.  Please note that Annual Wellness Visits do not include a physical exam. Some assessments may be limited, especially if the visit was conducted virtually. If needed, we may recommend an in-person follow-up with your provider.  Ongoing Care Seeing your primary care provider every 3 to 6 months helps us  monitor your health and provide consistent, personalized care.   Referrals If a referral was made during today's visit and you haven't received any updates within two weeks, please contact the referred provider directly to check on the status.  Recommended Screenings:  Health Maintenance  Topic Date Due   Complete foot exam   Never done   Yearly kidney health urinalysis for diabetes  Never done   Eye exam for diabetics  02/07/2024   Flu Shot  03/22/2024   COVID-19 Vaccine (6 - 2025-26 season) 04/22/2024   Medicare Annual Wellness Visit  04/27/2024   Hemoglobin A1C  08/10/2024   Yearly kidney function blood test for diabetes  02/08/2025   Breast Cancer Screening  04/30/2026   Colon Cancer Screening  09/21/2031   DTaP/Tdap/Td vaccine (3 - Td or Tdap) 05/21/2034   Pneumococcal Vaccine for age over 13  Completed   DEXA scan (bone density measurement)  Completed   Hepatitis C Screening  Completed   Zoster (Shingles) Vaccine  Completed   Meningitis B Vaccine  Aged Out   Hepatitis B Vaccine  Discontinued       07/04/2024    1:33 PM  Advanced Directives  Does Patient Have a Medical Advance Directive? No  Would patient like information on creating a medical advance directive? No - Patient declined    Vision: Annual vision screenings are recommended for early detection of glaucoma, cataracts, and diabetic retinopathy. These exams can also reveal signs of  chronic conditions such as diabetes and high blood pressure.  Dental: Annual dental screenings help detect early signs of oral cancer, gum disease, and other conditions linked to overall health, including heart disease and diabetes.  Please see the attached documents for additional preventive care recommendations.

## 2024-07-04 NOTE — Progress Notes (Signed)
 Chief Complaint  Patient presents with   Medicare Wellness     Subjective:   Angie [name] is a 68 y.o. female who presents for a Medicare Annual Wellness Visit.  Allergies (verified) Aspirin and Nsaids   History: Past Medical History:  Diagnosis Date   Arthritis    Esophagitis, reflux    Gastritis    Hepatitis C 2014   treated with Harvoni   History of colon polyps    Hypertension    Kidney stone    Melanoma (HCC)    Osteoarthritis    Sepsis due to urinary tract infection (HCC) 2014   Thrombocytopenia, primary (HCC)    Dr Jacobo   Past Surgical History:  Procedure Laterality Date   COLONOSCOPY N/A 09/20/2021   Procedure: COLONOSCOPY;  Surgeon: Maryruth Ole DASEN, MD;  Location: [facility] ENDOSCOPY;  Service: Endoscopy;  Laterality: N/A;   COLONOSCOPY WITH PROPOFOL  N/A 10/04/2016   Procedure: COLONOSCOPY WITH PROPOFOL ;  Surgeon: Gladis RAYMOND Mariner, MD;  Location: [facility] ENDOSCOPY;  Service: Endoscopy;  Laterality: N/A;   ESOPHAGOGASTRODUODENOSCOPY (EGD) WITH PROPOFOL  N/A 05/13/2016   Procedure: ESOPHAGOGASTRODUODENOSCOPY (EGD) WITH PROPOFOL ;  Surgeon: Gladis RAYMOND Mariner, MD;  Location: [facility] ENDOSCOPY;  Service: Endoscopy;  Laterality: N/A;   ESOPHAGOGASTRODUODENOSCOPY (EGD) WITH PROPOFOL  N/A 10/19/2018   Procedure: ESOPHAGOGASTRODUODENOSCOPY (EGD) WITH PROPOFOL ;  Surgeon: Mariner Gladis RAYMOND, MD;  Location: [facility] ENDOSCOPY;  Service: Endoscopy;  Laterality: N/A;   ESOPHAGOGASTRODUODENOSCOPY (EGD) WITH PROPOFOL  N/A 09/20/2021   Procedure: ESOPHAGOGASTRODUODENOSCOPY (EGD) WITH PROPOFOL ;  Surgeon: Maryruth Ole DASEN, MD;  Location: [facility] ENDOSCOPY;  Service: Endoscopy;  Laterality: N/A;   HERNIA REPAIR  07/06/2023   JOINT REPLACEMENT Left 2015   TONSILLECTOMY     TONSILLECTOMY     TOTAL KNEE ARTHROPLASTY     Family History  Problem Relation Age of Onset   Liver cancer Father    Cancer Father    COPD Father    Heart disease Father    COPD Mother    Hypertension Mother     Diabetes Sister    Diabetes Paternal Grandmother    Heart disease Paternal Grandmother    Hypertension Paternal Grandmother    Diabetes Paternal Grandfather    Heart disease Paternal Grandfather    Hypertension Paternal Grandfather    Breast cancer Neg Hx    Social History   Occupational History   Not on file  Tobacco Use   Smoking status: Former    Types: Cigarettes    Passive exposure: Past   Smokeless tobacco: Never  Vaping Use   Vaping status: Never Used  Substance and Sexual Activity   Alcohol use: Not Currently    Alcohol/week: 1.0 standard drink of alcohol    Types: 1 Glasses of wine per week    Comment: occasionally    Drug use: No   Sexual activity: Yes    Birth control/protection: None, Post-menopausal   Tobacco Counseling Counseling given: Not Answered  SDOH Screenings   Food Insecurity: No Food Insecurity (07/03/2024)  Housing: Unknown (07/03/2024)  Transportation Needs: No Transportation Needs (07/03/2024)  Utilities: Not At Risk (07/04/2024)  Alcohol Screen: Low Risk  (07/03/2024)  Depression (PHQ2-9): Low Risk  (07/04/2024)  Financial Resource Strain: Low Risk  (07/03/2024)  Physical Activity: Insufficiently Active (07/03/2024)  Social Connections: Moderately Integrated (07/03/2024)  Stress: No Stress Concern Present (07/03/2024)  Tobacco Use: Medium Risk (07/04/2024)  Health Literacy: Adequate Health Literacy (07/04/2024)   See flowsheets for full screening details  Depression Screen PHQ 2 & 9 Depression  Scale- Over the past 2 weeks, how often have you been bothered by any of the following problems? Little interest or pleasure in doing things: 0 Feeling down, depressed, or hopeless (PHQ Adolescent also includes...irritable): 0 PHQ-2 Total Score: 0 Trouble falling or staying asleep, or sleeping too much: 0 Feeling tired or having little energy: 0 Poor appetite or overeating (PHQ Adolescent also includes...weight loss): 0 Feeling bad about  yourself - or that you are a failure or have let yourself or your family down: 0 Trouble concentrating on things, such as reading the newspaper or watching television (PHQ Adolescent also includes...like school work): 0 Moving or speaking so slowly that other people could have noticed. Or the opposite - being so fidgety or restless that you have been moving around a lot more than usual: 0 Thoughts that you would be better off dead, or of hurting yourself in some way: 0 PHQ-9 Total Score: 0 If you checked off any problems, how difficult have these problems made it for you to do your work, take care of things at home, or get along with other people?: Not difficult at all  Depression Treatment Depression Interventions/Treatment : EYV7-0 Score <4 Follow-up Not Indicated     Goals Addressed             This Visit's Progress    Patient Stated       07/04/2024 wants to lose weight       Visit info / Clinical Intake: Medicare Wellness Visit Type:: Subsequent Annual Wellness Visit Persons participating in visit:: patient Medicare Wellness Visit Mode:: Video Because this visit was a virtual/telehealth visit:: pt reported vitals If Telephone or Video please confirm:: I connected with the patient using audio enabled telemedicine application and verified that I am speaking with the correct person using two identifiers; I discussed the limitations of evaluation and management by telemedicine; The patient expressed understanding and agreed to proceed Patient Location:: home Provider Location:: home office Information given by:: patient Interpreter Needed?: No Pre-visit prep was completed: yes AWV questionnaire completed by patient prior to visit?: yes Date:: 07/03/24 Living arrangements:: lives with spouse/significant other Patient's Overall Health Status Rating: good Typical amount of pain: none Does pain affect daily life?: no Are you currently prescribed opioids?: no  Dietary Habits  and Nutritional Risks How many meals a day?: 3 Eats fruit and vegetables daily?: yes Most meals are obtained by: preparing own meals In the last 2 weeks, have you had any of the following?: none Diabetic:: (!) yes Any non-healing wounds?: no How often do you check your BS?: 0 Would you like to be referred to a Nutritionist or for Diabetic Management? : no  Functional Status Activities of Daily Living (to include ambulation/medication): Independent Ambulation: Independent Medication Administration: Independent Home Management: Independent Manage your own finances?: yes Primary transportation is: driving Concerns about vision?: no *vision screening is required for WTM* Concerns about hearing?: no  Fall Screening Falls in the past year?: 0 Number of falls in past year: 0 Was there an injury with Fall?: 0 Fall Risk Category Calculator: 0 Patient Fall Risk Level: Low Fall Risk  Fall Risk Patient at Risk for Falls Due to: Medication side effect Fall risk Follow up: Falls evaluation completed; Falls prevention discussed  Home and Transportation Safety: All rugs have non-skid backing?: yes All stairs or steps have railings?: yes Grab bars in the bathtub or shower?: yes Have non-skid surface in bathtub or shower?: yes Good home lighting?: yes Regular seat belt use?:  yes Hospital stays in the last year:: (!) yes How many hospital stays:: 1 Reason: hernia repair  Cognitive Assessment Difficulty concentrating, remembering, or making decisions? : no Will 6CIT or Mini Cog be Completed: yes What year is it?: 0 points What month is it?: 0 points Give patient an address phrase to remember (5 components): [address] About what time is it?: 0 points Count backwards from 20 to 1: 0 points Say the months of the year in reverse: 0 points Repeat the address phrase from earlier: 0 points 6 CIT Score: 0 points  Advance Directives (For Healthcare) Does Patient Have a Medical  Advance Directive?: No Would patient like information on creating a medical advance directive?: No - Patient declined  Reviewed/Updated  Reviewed/Updated: Reviewed All (Medical, Surgical, Family, Medications, Allergies, Care Teams, Patient Goals)        Objective:    Today's Vitals   07/04/24 1327  BP: 108/62  Pulse: 87  Weight: 172 lb (78 kg)  Height: 5' 5.5 (1.664 m)   Body mass index is 28.19 kg/m.  Current Medications (verified) Outpatient Encounter Medications as of 07/04/2024  Medication Sig   Biotin 1 MG CAPS Take 1,000 mcg by mouth daily.   buPROPion  (WELLBUTRIN  XL) 300 MG 24 hr tablet Take 1 tablet (300 mg total) by mouth daily.   lisinopril -hydrochlorothiazide  (ZESTORETIC ) 10-12.5 MG tablet Take 1 tablet by mouth daily.   moxifloxacin (AVELOX) 400 MG tablet Take 400 mg by mouth.   ondansetron  (ZOFRAN ) 8 MG tablet TAKE 1 TABLET BY MOUTH EVERY 8 HOURS AS NEEDED   pantoprazole  (PROTONIX ) 40 MG tablet Take 1 tablet (40 mg total) by mouth daily.   predniSONE (DELTASONE) 10 MG tablet Take 10 mg by mouth.   prochlorperazine (COMPAZINE) 10 MG tablet Take by mouth.   promethazine-dextromethorphan (PROMETHAZINE-DM) 6.25-15 MG/5ML syrup Take 5 mLs by mouth.   topiramate (TOPAMAX) 25 MG tablet Take 1 tablet (25 mg total) by mouth 2 (two) times daily.   traMADol  (ULTRAM ) 50 MG tablet Take 2 tablets by mouth twice daily   No facility-administered encounter medications on file as of 07/04/2024.   Hearing/Vision screen Hearing Screening - Comments:: Denies hearing issues Vision Screening - Comments:: Regular eye exams, [facility] Immunizations and Health Maintenance Health Maintenance  Topic Date Due   FOOT EXAM  Never done   Diabetic kidney evaluation - Urine ACR  Never done   OPHTHALMOLOGY EXAM  02/07/2024   COVID-19 Vaccine (6 - 2025-26 season) 04/22/2024   HEMOGLOBIN A1C  08/10/2024   Diabetic kidney evaluation - eGFR measurement  02/08/2025   Medicare Annual  Wellness (AWV)  07/04/2025   Mammogram  04/30/2026   Colonoscopy  09/21/2031   DTaP/Tdap/Td (3 - Td or Tdap) 05/21/2034   Pneumococcal Vaccine: 50+ Years  Completed   Influenza Vaccine  Completed   DEXA SCAN  Completed   Hepatitis C Screening  Completed   Zoster Vaccines- Shingrix  Completed   Meningococcal B Vaccine  Aged Out   Hepatitis B Vaccines 19-59 Average Risk  Discontinued        Assessment/Plan:  This is a routine wellness examination for Angie [name].  Patient Care Team: Gayle Saddie JULIANNA DEVONNA as PCP - General (Physician Assistant) Dermatology, [place] Zani, Sabino Jr., MD as Consulting Physician (Orthopedic Surgery) [facility], [facility])  I have personally reviewed and noted the following in the patient's chart:   Medical and social history Use of alcohol, tobacco or illicit drugs  Current  medications and supplements including opioid prescriptions. Functional ability and status Nutritional status Physical activity Advanced directives List of other physicians Hospitalizations, surgeries, and ER visits in previous 12 months Vitals Screenings to include cognitive, depression, and falls Referrals and appointments  No orders of the defined types were placed in this encounter.  In addition, I have reviewed and discussed with patient certain preventive protocols, quality metrics, and best practice recommendations. A written personalized care plan for preventive services as well as general preventive health recommendations were provided to patient.   Angie FORBES Dawn, LPN   [id number]   Return in 1 year (on 07/04/2025).  After Visit Summary: (MyChart) Due to this being a telephonic visit, the after visit summary with patients personalized plan was offered to patient via MyChart   Nurse Notes: Declines covid vaccine

## 2024-07-21 ENCOUNTER — Other Ambulatory Visit: Payer: Self-pay

## 2024-07-21 DIAGNOSIS — G8929 Other chronic pain: Secondary | ICD-10-CM

## 2024-07-22 ENCOUNTER — Other Ambulatory Visit: Payer: Self-pay

## 2024-07-22 DIAGNOSIS — G8929 Other chronic pain: Secondary | ICD-10-CM

## 2024-07-22 MED ORDER — TRAMADOL HCL 50 MG PO TABS: 100.0000 mg | ORAL_TABLET | Freq: Two times a day (BID) | ORAL | 0 refills | Status: DC

## 2024-08-16 ENCOUNTER — Other Ambulatory Visit: Payer: Self-pay

## 2024-08-21 ENCOUNTER — Other Ambulatory Visit: Payer: Self-pay

## 2024-08-21 DIAGNOSIS — G8929 Other chronic pain: Secondary | ICD-10-CM

## 2024-08-21 MED ORDER — TRAMADOL HCL 50 MG PO TABS: 100.0000 mg | ORAL_TABLET | Freq: Two times a day (BID) | ORAL | 0 refills | Status: DC

## 2024-09-14 ENCOUNTER — Other Ambulatory Visit: Payer: Self-pay

## 2024-09-23 ENCOUNTER — Ambulatory Visit

## 2024-09-23 ENCOUNTER — Other Ambulatory Visit: Payer: Self-pay

## 2024-09-23 DIAGNOSIS — G8929 Other chronic pain: Secondary | ICD-10-CM

## 2024-10-14 ENCOUNTER — Ambulatory Visit

## 2025-07-31 ENCOUNTER — Ambulatory Visit
